# Patient Record
Sex: Female | Born: 1935
Health system: Southern US, Community
[De-identification: ages and names within clinical notes are randomized; demographics above are authoritative.]

## PROBLEM LIST (undated history)

## (undated) DIAGNOSIS — N189 Chronic kidney disease, unspecified: Secondary | ICD-10-CM

## (undated) DIAGNOSIS — Z973 Presence of spectacles and contact lenses: Secondary | ICD-10-CM

## (undated) DIAGNOSIS — I4892 Unspecified atrial flutter: Secondary | ICD-10-CM

## (undated) DIAGNOSIS — I480 Paroxysmal atrial fibrillation: Secondary | ICD-10-CM

## (undated) DIAGNOSIS — Z974 Presence of external hearing-aid: Secondary | ICD-10-CM

## (undated) DIAGNOSIS — Z950 Presence of cardiac pacemaker: Secondary | ICD-10-CM

## (undated) DIAGNOSIS — J810 Acute pulmonary edema: Secondary | ICD-10-CM

## (undated) DIAGNOSIS — I10 Essential (primary) hypertension: Secondary | ICD-10-CM

## (undated) DIAGNOSIS — M8580 Other specified disorders of bone density and structure, unspecified site: Secondary | ICD-10-CM

## (undated) DIAGNOSIS — N179 Acute kidney failure, unspecified: Secondary | ICD-10-CM

## (undated) DIAGNOSIS — N2889 Other specified disorders of kidney and ureter: Secondary | ICD-10-CM

## (undated) DIAGNOSIS — K219 Gastro-esophageal reflux disease without esophagitis: Secondary | ICD-10-CM

## (undated) DIAGNOSIS — D151 Benign neoplasm of heart: Secondary | ICD-10-CM

## (undated) DIAGNOSIS — I495 Sick sinus syndrome: Secondary | ICD-10-CM

## (undated) HISTORY — DX: Gastro-esophageal reflux disease without esophagitis: K21.9

## (undated) HISTORY — PX: APPENDECTOMY: SHX54

## (undated) HISTORY — DX: Other specified disorders of bone density and structure, unspecified site: M85.80

## (undated) HISTORY — PX: CHOLECYSTECTOMY: SHX55

## (undated) HISTORY — DX: Paroxysmal atrial fibrillation: I48.0

## (undated) HISTORY — DX: Unspecified atrial flutter: I48.92

## (undated) HISTORY — DX: Presence of cardiac pacemaker: Z95.0

## (undated) HISTORY — DX: Benign neoplasm of heart: D15.1

## (undated) HISTORY — PX: PARTIAL HYSTERECTOMY: SHX80

## (undated) HISTORY — PX: OTHER SURGICAL HISTORY: SHX169

## (undated) HISTORY — PX: BREAST SURGERY: SHX581

## (undated) HISTORY — DX: Sick sinus syndrome: I49.5

## (undated) HISTORY — DX: Acute pulmonary edema: J81.0

## (undated) HISTORY — DX: Acute kidney failure, unspecified: N17.9

## (undated) HISTORY — PX: PACEMAKER INSERTION: SHX728

---
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
## 2008-06-04 DIAGNOSIS — Z95810 Presence of automatic (implantable) cardiac defibrillator: Secondary | ICD-10-CM | POA: Insufficient documentation

## 2008-06-04 HISTORY — PX: PACEMAKER INSERTION: SHX728

## 2008-06-04 HISTORY — DX: Presence of automatic (implantable) cardiac defibrillator: Z95.810

## 2008-06-04 HISTORY — PX: CHOLECYSTECTOMY: SHX55

## 2008-12-21 ENCOUNTER — Ambulatory Visit: Payer: Self-pay | Admitting: Cardiovascular Disease

## 2008-12-21 ENCOUNTER — Inpatient Hospital Stay (HOSPITAL_COMMUNITY): Admission: AD | Admit: 2008-12-21 | Discharge: 2009-01-04 | Payer: Self-pay | Admitting: Cardiovascular Disease

## 2008-12-22 ENCOUNTER — Encounter: Payer: Self-pay | Admitting: Internal Medicine

## 2008-12-22 ENCOUNTER — Encounter: Payer: Self-pay | Admitting: Cardiovascular Disease

## 2008-12-22 ENCOUNTER — Ambulatory Visit: Payer: Self-pay | Admitting: Thoracic Surgery (Cardiothoracic Vascular Surgery)

## 2008-12-23 ENCOUNTER — Encounter: Payer: Self-pay | Admitting: Thoracic Surgery (Cardiothoracic Vascular Surgery)

## 2008-12-24 ENCOUNTER — Encounter: Payer: Self-pay | Admitting: Thoracic Surgery (Cardiothoracic Vascular Surgery)

## 2008-12-24 HISTORY — PX: ATRIAL MYXOMA EXCISION: SHX1198

## 2009-01-04 ENCOUNTER — Encounter: Payer: Self-pay | Admitting: Internal Medicine

## 2009-01-05 ENCOUNTER — Telehealth: Payer: Self-pay | Admitting: Cardiovascular Disease

## 2009-01-05 ENCOUNTER — Ambulatory Visit: Payer: Self-pay | Admitting: Cardiovascular Disease

## 2009-01-06 ENCOUNTER — Encounter (INDEPENDENT_AMBULATORY_CARE_PROVIDER_SITE_OTHER): Payer: Self-pay | Admitting: *Deleted

## 2009-01-06 ENCOUNTER — Encounter: Payer: Self-pay | Admitting: Cardiology

## 2009-01-06 LAB — CONVERTED CEMR LAB
INR: 2.3
POC INR: 2.3

## 2009-01-08 ENCOUNTER — Inpatient Hospital Stay (HOSPITAL_COMMUNITY)
Admission: EM | Admit: 2009-01-08 | Discharge: 2009-01-19 | Payer: Self-pay | Admitting: Thoracic Surgery (Cardiothoracic Vascular Surgery)

## 2009-01-08 ENCOUNTER — Ambulatory Visit: Payer: Self-pay | Admitting: Internal Medicine

## 2009-01-10 ENCOUNTER — Encounter: Payer: Self-pay | Admitting: Surgery

## 2009-01-11 ENCOUNTER — Encounter: Payer: Self-pay | Admitting: Thoracic Surgery (Cardiothoracic Vascular Surgery)

## 2009-01-17 ENCOUNTER — Encounter: Payer: Self-pay | Admitting: Cardiovascular Disease

## 2009-01-18 ENCOUNTER — Encounter: Payer: Self-pay | Admitting: Cardiovascular Disease

## 2009-01-21 ENCOUNTER — Encounter: Payer: Self-pay | Admitting: Cardiology

## 2009-01-21 ENCOUNTER — Encounter (INDEPENDENT_AMBULATORY_CARE_PROVIDER_SITE_OTHER): Payer: Self-pay | Admitting: Cardiology

## 2009-01-24 ENCOUNTER — Ambulatory Visit: Payer: Self-pay | Admitting: Thoracic Surgery (Cardiothoracic Vascular Surgery)

## 2009-01-24 ENCOUNTER — Encounter
Admission: RE | Admit: 2009-01-24 | Discharge: 2009-01-24 | Payer: Self-pay | Admitting: Thoracic Surgery (Cardiothoracic Vascular Surgery)

## 2009-01-26 ENCOUNTER — Encounter: Payer: Self-pay | Admitting: Internal Medicine

## 2009-01-27 ENCOUNTER — Encounter: Payer: Self-pay | Admitting: Cardiovascular Disease

## 2009-01-27 DIAGNOSIS — I4891 Unspecified atrial fibrillation: Secondary | ICD-10-CM

## 2009-01-27 DIAGNOSIS — M899 Disorder of bone, unspecified: Secondary | ICD-10-CM

## 2009-01-27 DIAGNOSIS — I495 Sick sinus syndrome: Secondary | ICD-10-CM

## 2009-01-27 DIAGNOSIS — D219 Benign neoplasm of connective and other soft tissue, unspecified: Secondary | ICD-10-CM

## 2009-01-27 DIAGNOSIS — K219 Gastro-esophageal reflux disease without esophagitis: Secondary | ICD-10-CM | POA: Insufficient documentation

## 2009-01-27 DIAGNOSIS — M949 Disorder of cartilage, unspecified: Secondary | ICD-10-CM

## 2009-01-27 HISTORY — DX: Disorder of bone, unspecified: M89.9

## 2009-01-27 HISTORY — DX: Unspecified atrial fibrillation: I48.91

## 2009-01-27 HISTORY — DX: Gastro-esophageal reflux disease without esophagitis: K21.9

## 2009-01-27 HISTORY — DX: Sick sinus syndrome: I49.5

## 2009-01-27 HISTORY — DX: Benign neoplasm of connective and other soft tissue, unspecified: D21.9

## 2009-01-27 LAB — CONVERTED CEMR LAB: POC INR: 2.3

## 2009-01-28 ENCOUNTER — Ambulatory Visit: Payer: Self-pay | Admitting: Internal Medicine

## 2009-01-31 LAB — CONVERTED CEMR LAB
Basophils Absolute: 0 10*3/uL (ref 0.0–0.1)
Calcium: 9.2 mg/dL (ref 8.4–10.5)
Creatinine, Ser: 0.8 mg/dL (ref 0.4–1.2)
Eosinophils Absolute: 0.2 10*3/uL (ref 0.0–0.7)
Free T4: 0.7 ng/dL (ref 0.6–1.6)
GFR calc non Af Amer: 74.43 mL/min (ref >60)
HCT: 36.3 % (ref 36.0–46.0)
Hemoglobin: 12 g/dL (ref 12.0–15.0)
Lymphs Abs: 2 10*3/uL (ref 0.7–4.0)
MCHC: 33.1 g/dL (ref 30.0–36.0)
Monocytes Absolute: 1 10*3/uL (ref 0.1–1.0)
Neutro Abs: 7.2 10*3/uL (ref 1.4–7.7)
Platelets: 397 10*3/uL (ref 150.0–400.0)
Pro B Natriuretic peptide (BNP): 111 pg/mL — ABNORMAL HIGH (ref 0.0–100.0)
RDW: 15.9 % — ABNORMAL HIGH (ref 11.5–14.6)
Sodium: 141 meq/L (ref 135–145)
TSH: 3.53 microintl units/mL (ref 0.35–5.50)

## 2009-02-01 ENCOUNTER — Encounter: Payer: Self-pay | Admitting: Cardiovascular Disease

## 2009-02-02 HISTORY — PX: PACEMAKER INSERTION: SHX728

## 2009-02-08 ENCOUNTER — Telehealth: Payer: Self-pay | Admitting: Internal Medicine

## 2009-02-09 ENCOUNTER — Encounter: Payer: Self-pay | Admitting: Cardiology

## 2009-02-10 ENCOUNTER — Ambulatory Visit: Payer: Self-pay | Admitting: Cardiovascular Disease

## 2009-02-16 ENCOUNTER — Encounter: Payer: Self-pay | Admitting: Internal Medicine

## 2009-02-16 ENCOUNTER — Encounter: Payer: Self-pay | Admitting: Cardiovascular Disease

## 2009-02-16 LAB — CONVERTED CEMR LAB: POC INR: 2.7

## 2009-02-21 ENCOUNTER — Encounter: Payer: Self-pay | Admitting: Cardiovascular Disease

## 2009-02-28 ENCOUNTER — Ambulatory Visit: Payer: Self-pay | Admitting: Cardiovascular Disease

## 2009-03-02 ENCOUNTER — Encounter: Payer: Self-pay | Admitting: Cardiovascular Disease

## 2009-03-02 ENCOUNTER — Encounter (INDEPENDENT_AMBULATORY_CARE_PROVIDER_SITE_OTHER): Payer: Self-pay | Admitting: Pharmacist

## 2009-03-07 ENCOUNTER — Encounter (INDEPENDENT_AMBULATORY_CARE_PROVIDER_SITE_OTHER): Payer: Self-pay | Admitting: Cardiology

## 2009-03-08 ENCOUNTER — Encounter: Payer: Self-pay | Admitting: Cardiology

## 2009-03-21 ENCOUNTER — Encounter: Payer: Self-pay | Admitting: Cardiovascular Disease

## 2009-03-21 ENCOUNTER — Ambulatory Visit: Payer: Self-pay | Admitting: Thoracic Surgery (Cardiothoracic Vascular Surgery)

## 2009-04-08 ENCOUNTER — Ambulatory Visit: Payer: Self-pay | Admitting: Internal Medicine

## 2009-04-08 ENCOUNTER — Ambulatory Visit (HOSPITAL_COMMUNITY): Admission: RE | Admit: 2009-04-08 | Discharge: 2009-04-08 | Payer: Self-pay | Admitting: Cardiovascular Disease

## 2009-04-08 ENCOUNTER — Encounter: Payer: Self-pay | Admitting: Cardiovascular Disease

## 2009-04-08 ENCOUNTER — Ambulatory Visit: Payer: Self-pay

## 2009-04-08 ENCOUNTER — Ambulatory Visit: Payer: Self-pay | Admitting: Cardiology

## 2009-04-14 ENCOUNTER — Telehealth: Payer: Self-pay | Admitting: Cardiovascular Disease

## 2009-05-03 ENCOUNTER — Ambulatory Visit: Payer: Self-pay | Admitting: Cardiovascular Disease

## 2009-06-27 ENCOUNTER — Encounter
Admission: RE | Admit: 2009-06-27 | Discharge: 2009-06-27 | Payer: Self-pay | Admitting: Thoracic Surgery (Cardiothoracic Vascular Surgery)

## 2009-06-27 ENCOUNTER — Encounter: Payer: Self-pay | Admitting: Internal Medicine

## 2009-06-27 ENCOUNTER — Ambulatory Visit: Payer: Self-pay | Admitting: Thoracic Surgery (Cardiothoracic Vascular Surgery)

## 2009-08-29 ENCOUNTER — Telehealth: Payer: Self-pay | Admitting: Cardiovascular Disease

## 2009-10-19 ENCOUNTER — Encounter: Payer: Self-pay | Admitting: Internal Medicine

## 2009-11-08 ENCOUNTER — Encounter: Payer: Self-pay | Admitting: Internal Medicine

## 2009-11-14 ENCOUNTER — Ambulatory Visit: Payer: Self-pay | Admitting: Cardiovascular Disease

## 2009-12-02 ENCOUNTER — Encounter: Payer: Self-pay | Admitting: Cardiovascular Disease

## 2009-12-02 ENCOUNTER — Ambulatory Visit: Payer: Self-pay

## 2009-12-02 ENCOUNTER — Ambulatory Visit (HOSPITAL_COMMUNITY): Admission: RE | Admit: 2009-12-02 | Discharge: 2009-12-02 | Payer: Self-pay | Admitting: Cardiovascular Disease

## 2009-12-02 ENCOUNTER — Ambulatory Visit: Payer: Self-pay | Admitting: Internal Medicine

## 2009-12-07 ENCOUNTER — Telehealth: Payer: Self-pay | Admitting: Cardiovascular Disease

## 2010-01-30 ENCOUNTER — Telehealth (INDEPENDENT_AMBULATORY_CARE_PROVIDER_SITE_OTHER): Payer: Self-pay | Admitting: *Deleted

## 2010-02-14 ENCOUNTER — Ambulatory Visit: Payer: Self-pay

## 2010-02-14 ENCOUNTER — Encounter: Payer: Self-pay | Admitting: Internal Medicine

## 2010-04-02 IMAGING — CR DG CHEST 1V PORT
1 series · 1 of 1 positions shown · non-contrast
Comparison: 01/09/2009

CLINICAL DATA: Hypoxia/pleural effusion

PORTABLE CHEST - 1 VIEW

[view not recorded]
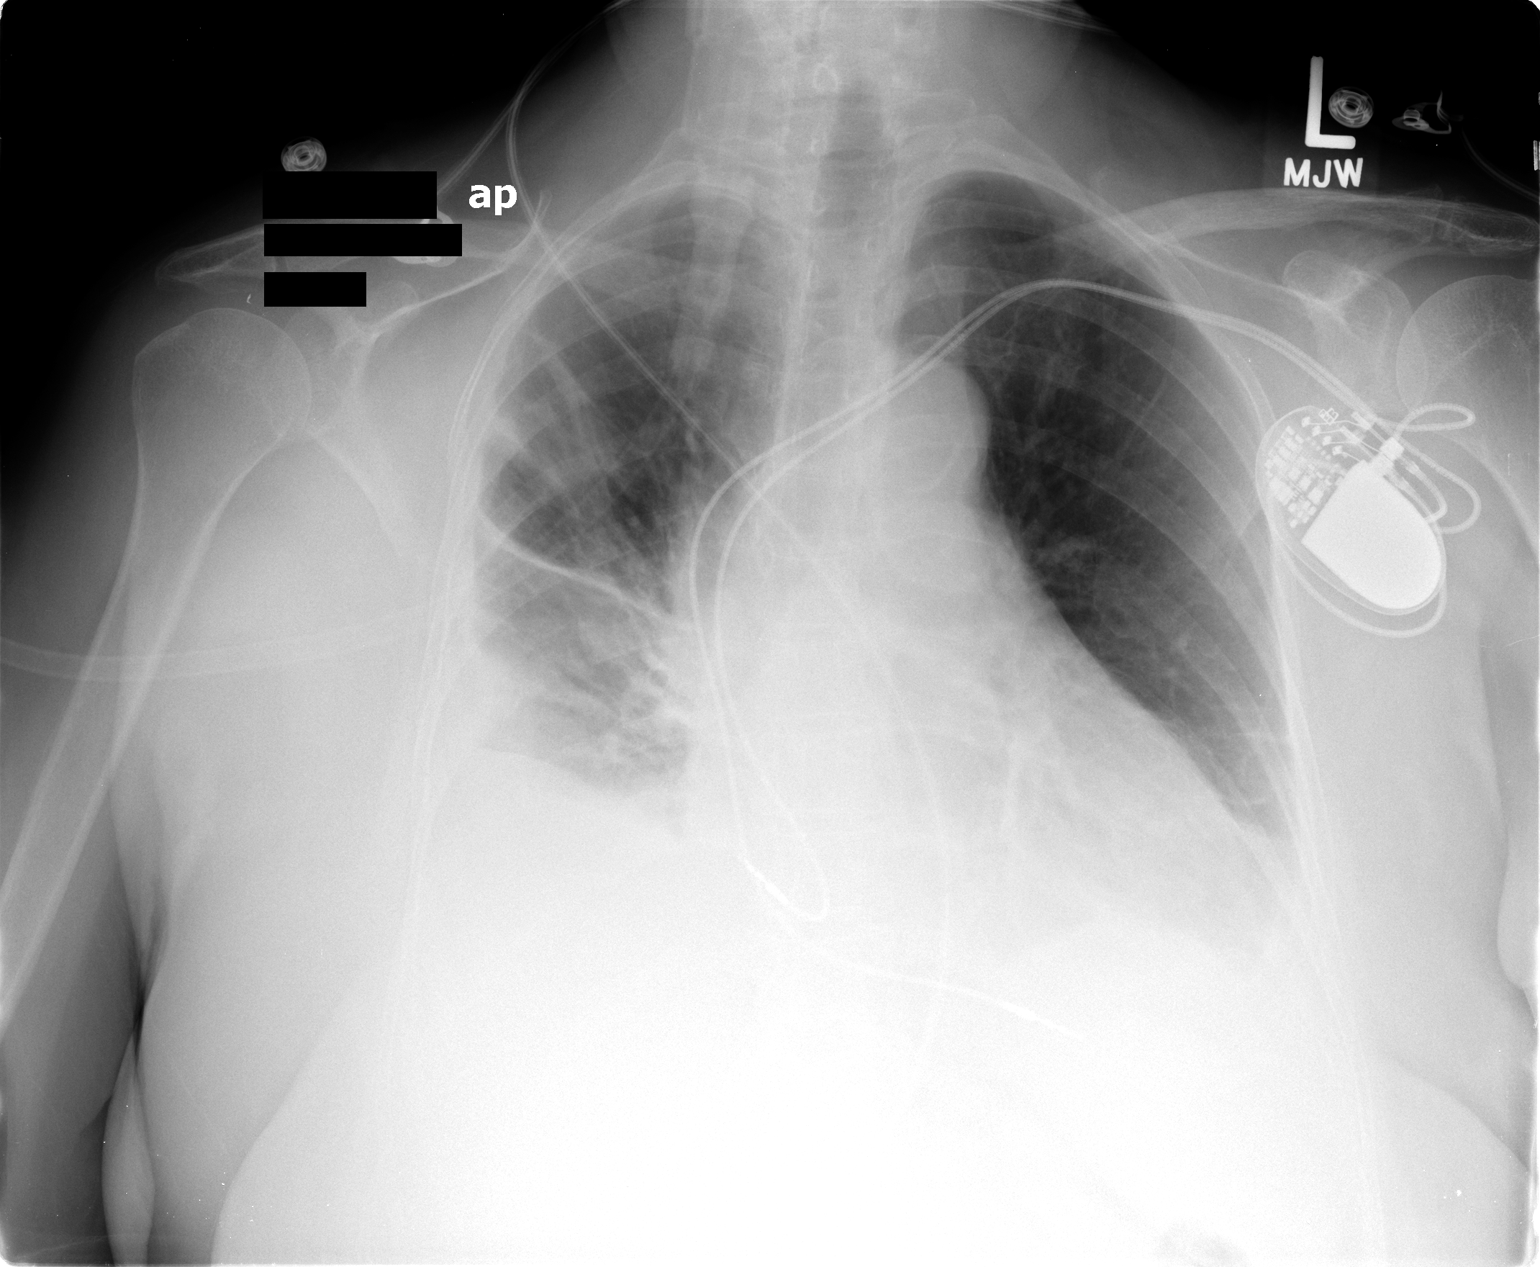

[1 of 1 positions shown; findings below may reference images not displayed]

FINDINGS: Right pleural and parenchymal changes stable.  Left lung
essentially clear.  Mild cardiomegaly without congestive heart
failure.  Overall no significant change.
IMPRESSION: No significant change.

## 2010-04-03 IMAGING — CR DG CHEST 1V PORT
1 series · 1 of 1 positions shown · non-contrast
Comparison: [DATE]/8808 0098 hours

CLINICAL DATA: Postop

PORTABLE CHEST - 1 VIEW

[view not recorded]
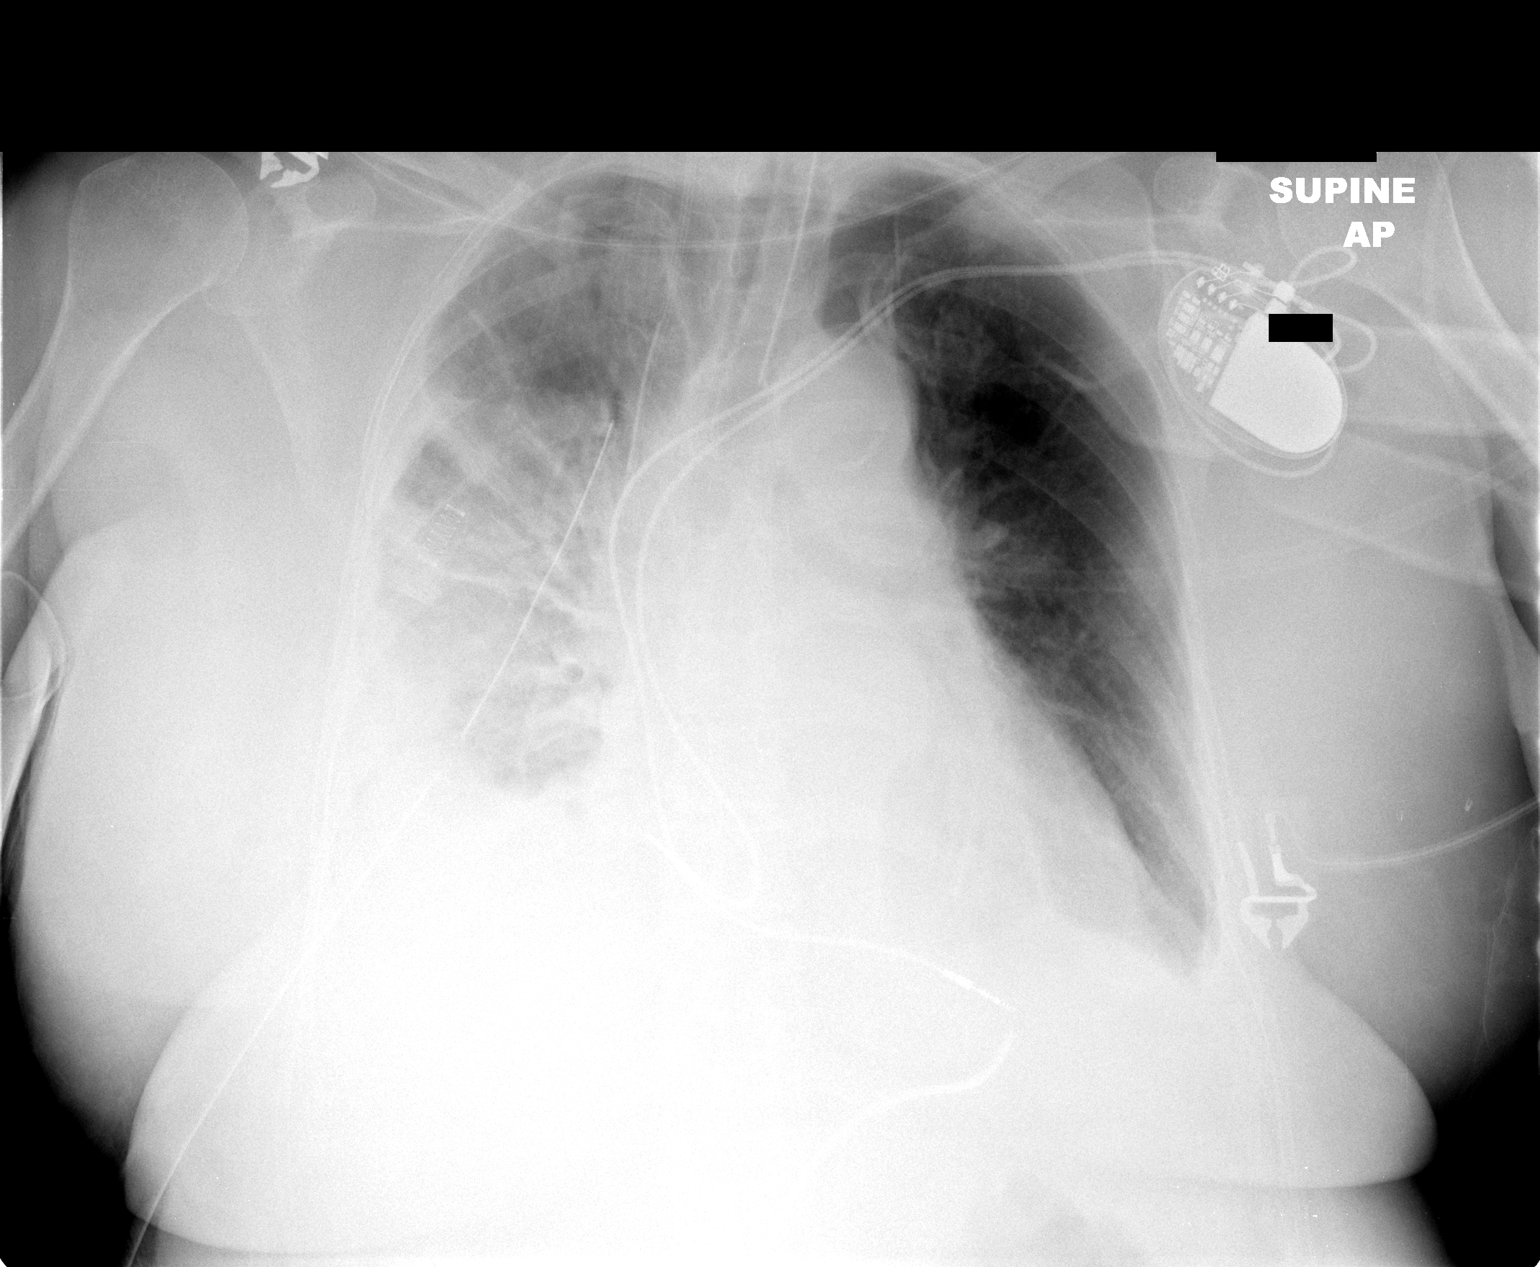

[1 of 1 positions shown; findings below may reference images not displayed]

FINDINGS: An endotracheal tube has been placed with its tip 3 cm
from the carina.  The right chest tube is stable.  Bilateral
pulmonary airspace opacities, right greater than left are stable.
Right pleural fluid is stable.  No pneumothorax.  Small left
effusion is unchanged.  Pacemaker device is stable.  Right internal
jugular central venous catheter is stable.
IMPRESSION: Endotracheal tube placement.  Stable airspace opacities and
effusions right greater than left.  No pneumothorax.

## 2010-04-03 IMAGING — CR DG CHEST 1V PORT
1 series · 1 of 1 positions shown · non-contrast
Comparison: 01/10/2009.

CLINICAL DATA: Pleural effusion.  Hypoxia.  Status post VATS

PORTABLE CHEST - 1 VIEW

[view not recorded]
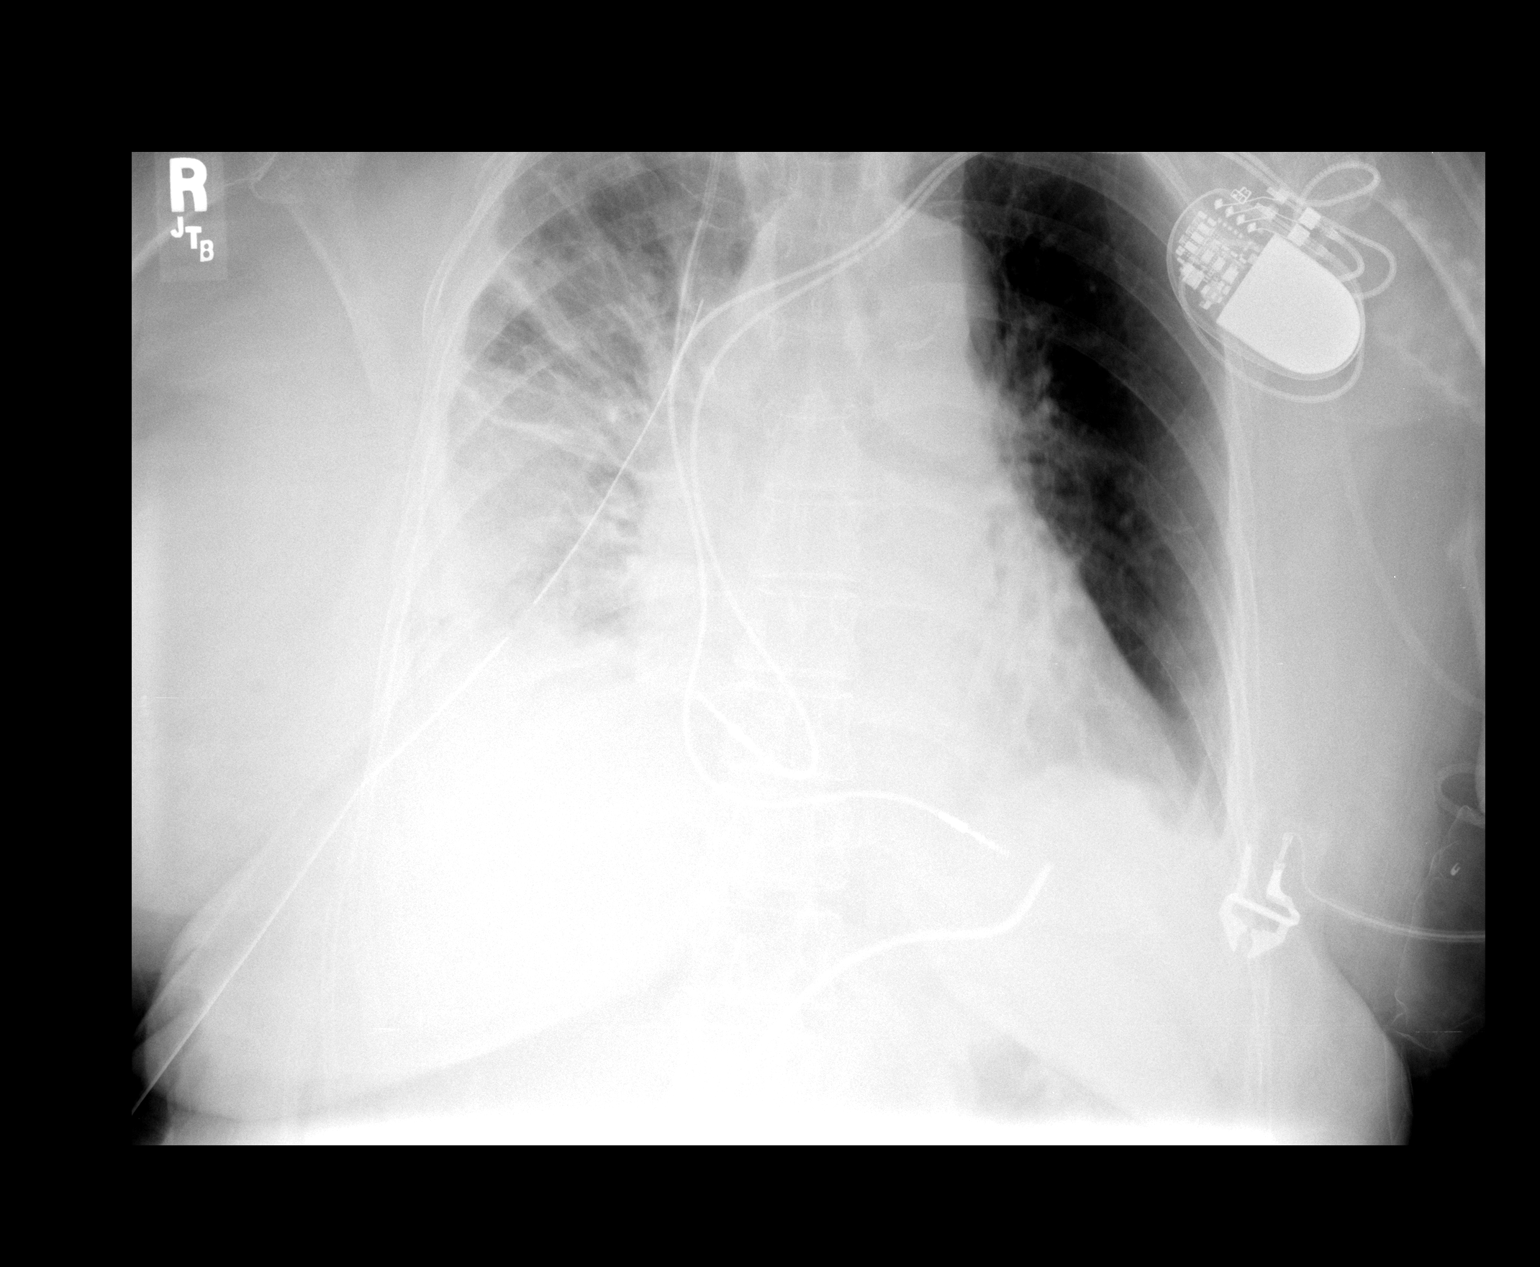

[1 of 1 positions shown; findings below may reference images not displayed]

FINDINGS: There is increasing atelectasis and a airspace disease
within the right hemithorax.  Right thoracostomy tube has been
introduced.  No definite right apical pneumothorax is identified.
Cardiomegaly persists.  Pleural fluid is present in the inferior
right hemithorax.  Left lung appears unchanged.  Pacemaker leads
unchanged.  There is a line of that overlies the central abdomen
and left upper quadrant.  Anatomic location is unclear and it could
potentially be external to the patient. New right IJ central line
is present with the tip terminating in the upper SVC.
IMPRESSION: 1.  Interval placement of right thoracostomy tube without
pneumothorax identified.
2.  Interval placement right IJ central line.
3.  Worsening aeration in the right lung with increasing
atelectasis, airspace disease.

## 2010-04-06 IMAGING — CR DG CHEST 1V PORT
1 series · 1 of 1 positions shown · non-contrast
Comparison: 01/13/2009

CLINICAL DATA: Pleural effusion.  Hypoxia.

PORTABLE CHEST - 1 VIEW

[AP]
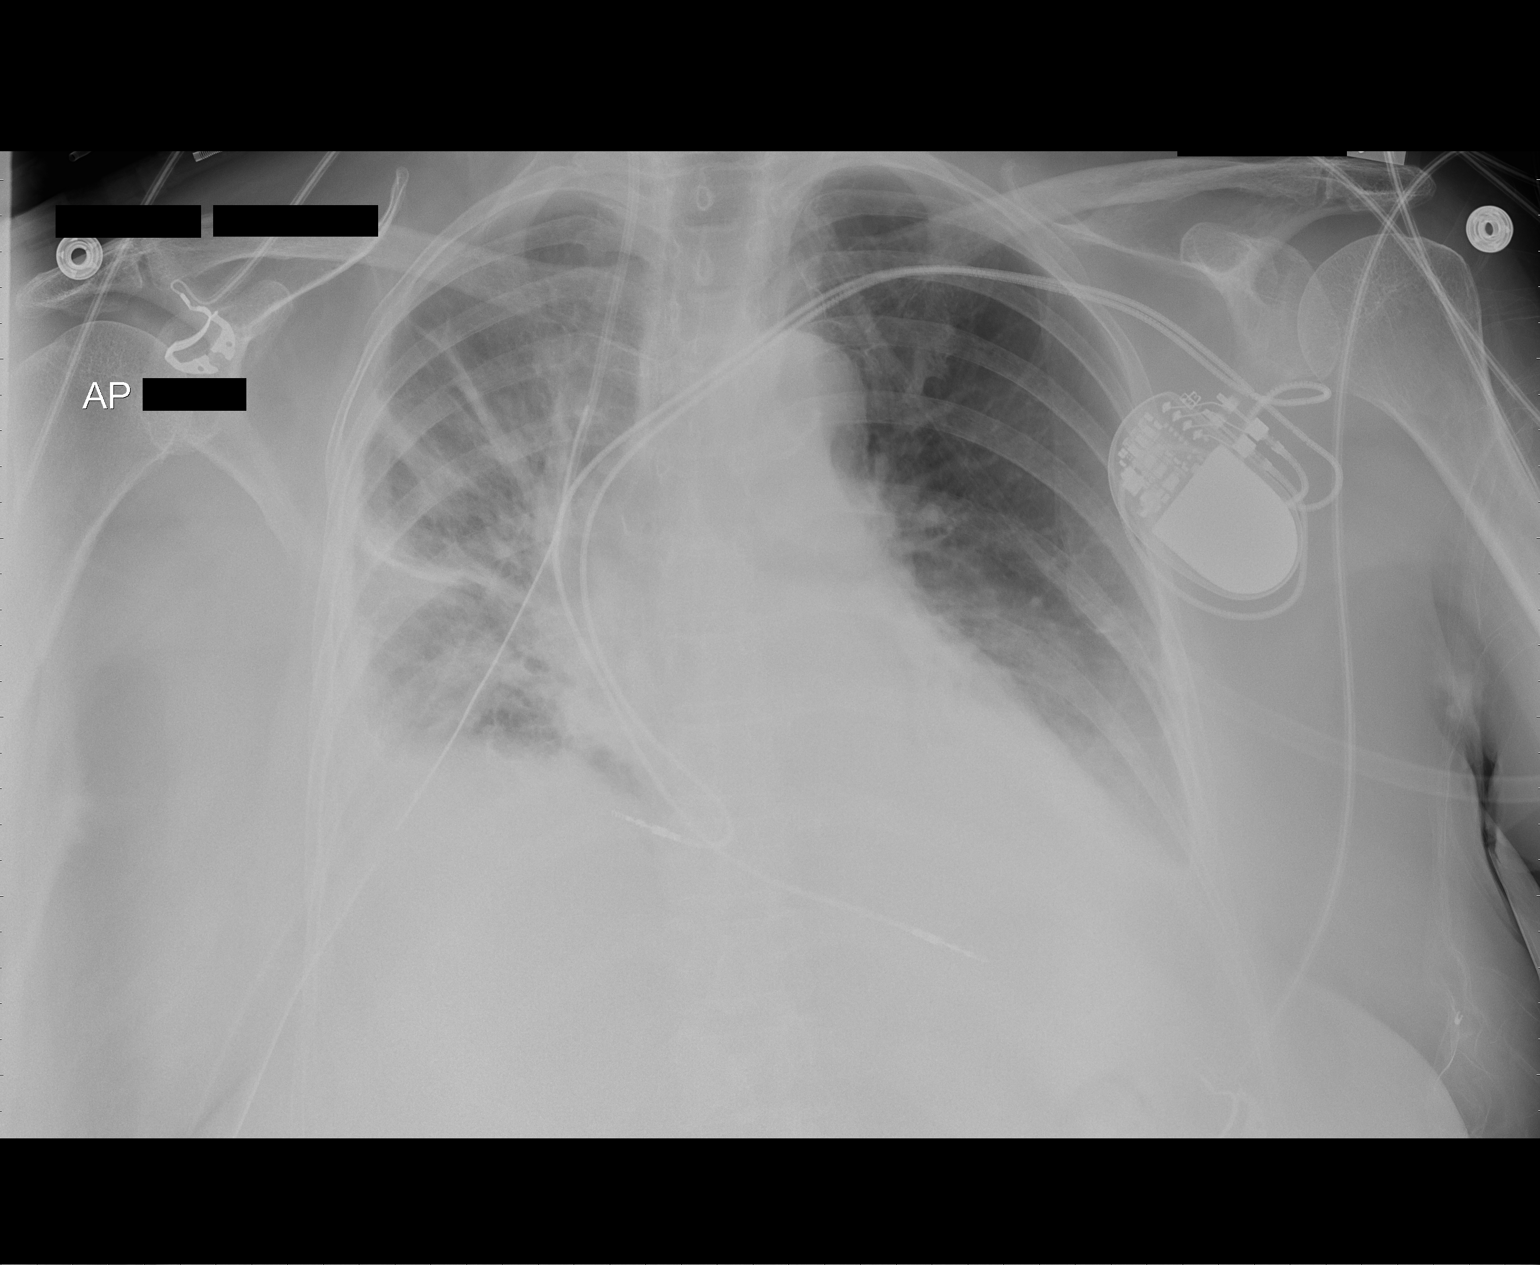

[1 of 1 positions shown; findings below may reference images not displayed]

FINDINGS: Patient has left-sided transvenous pacemaker.  Leads
overlie the right atrium and right ventricle.  The heart is
enlarged.  Left base opacity obscures the hemidiaphragm.  There has
been some improvement in aeration of the right lung base.  Streaky
perihilar and basilar atelectasis persist on the right.  Right
chest tube is in place.  There is no evidence for pneumothorax.
IMPRESSION: Improved aeration right lung base.

## 2010-04-13 ENCOUNTER — Telehealth: Payer: Self-pay | Admitting: Cardiovascular Disease

## 2010-04-16 IMAGING — CR DG CHEST 2V
2 series · 2 of 2 positions shown · non-contrast
Comparison: 01/17/2009.

CLINICAL DATA: Pleural effusion.  Pacemaker.

CHEST - 2 VIEW

[w chest pa]
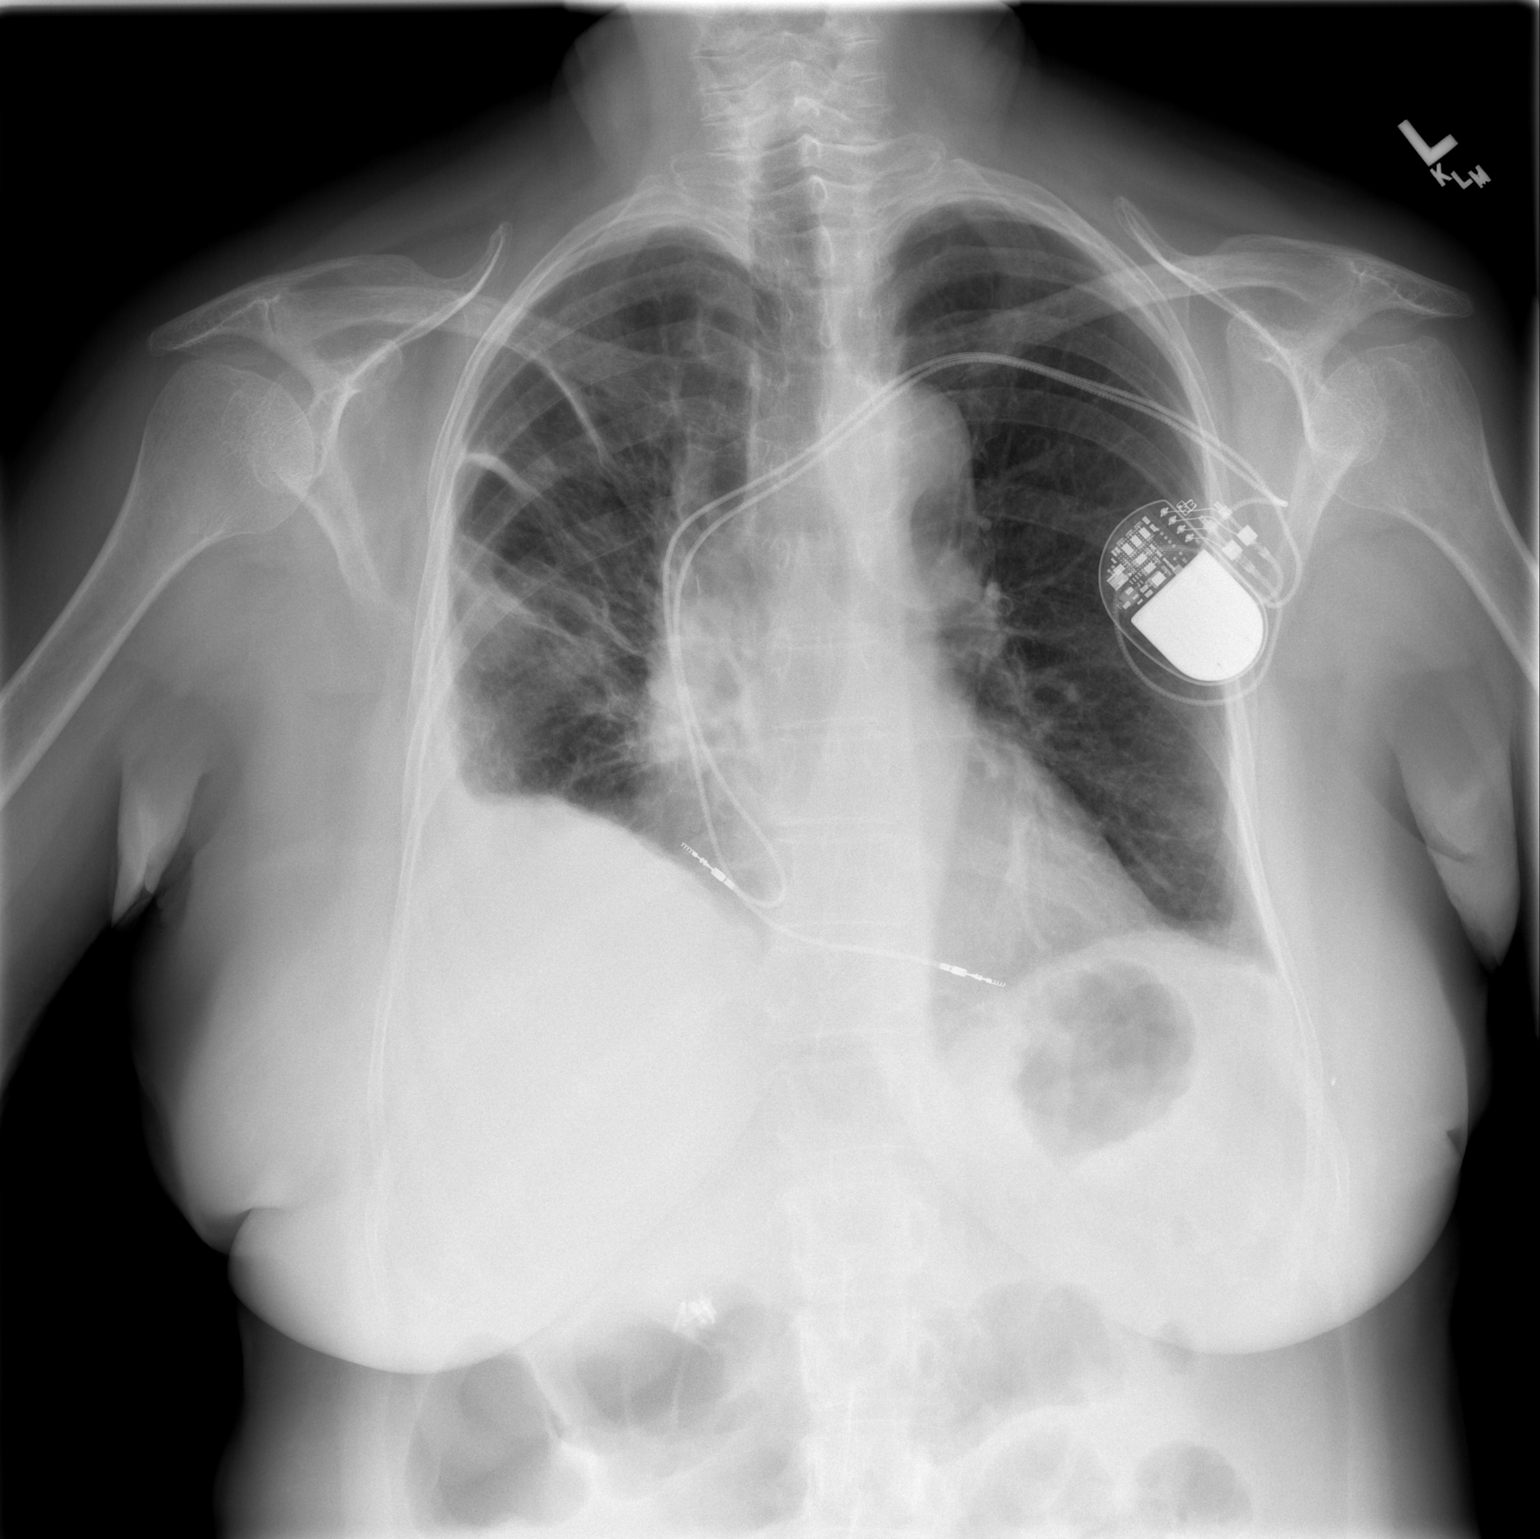

[w chest lat]
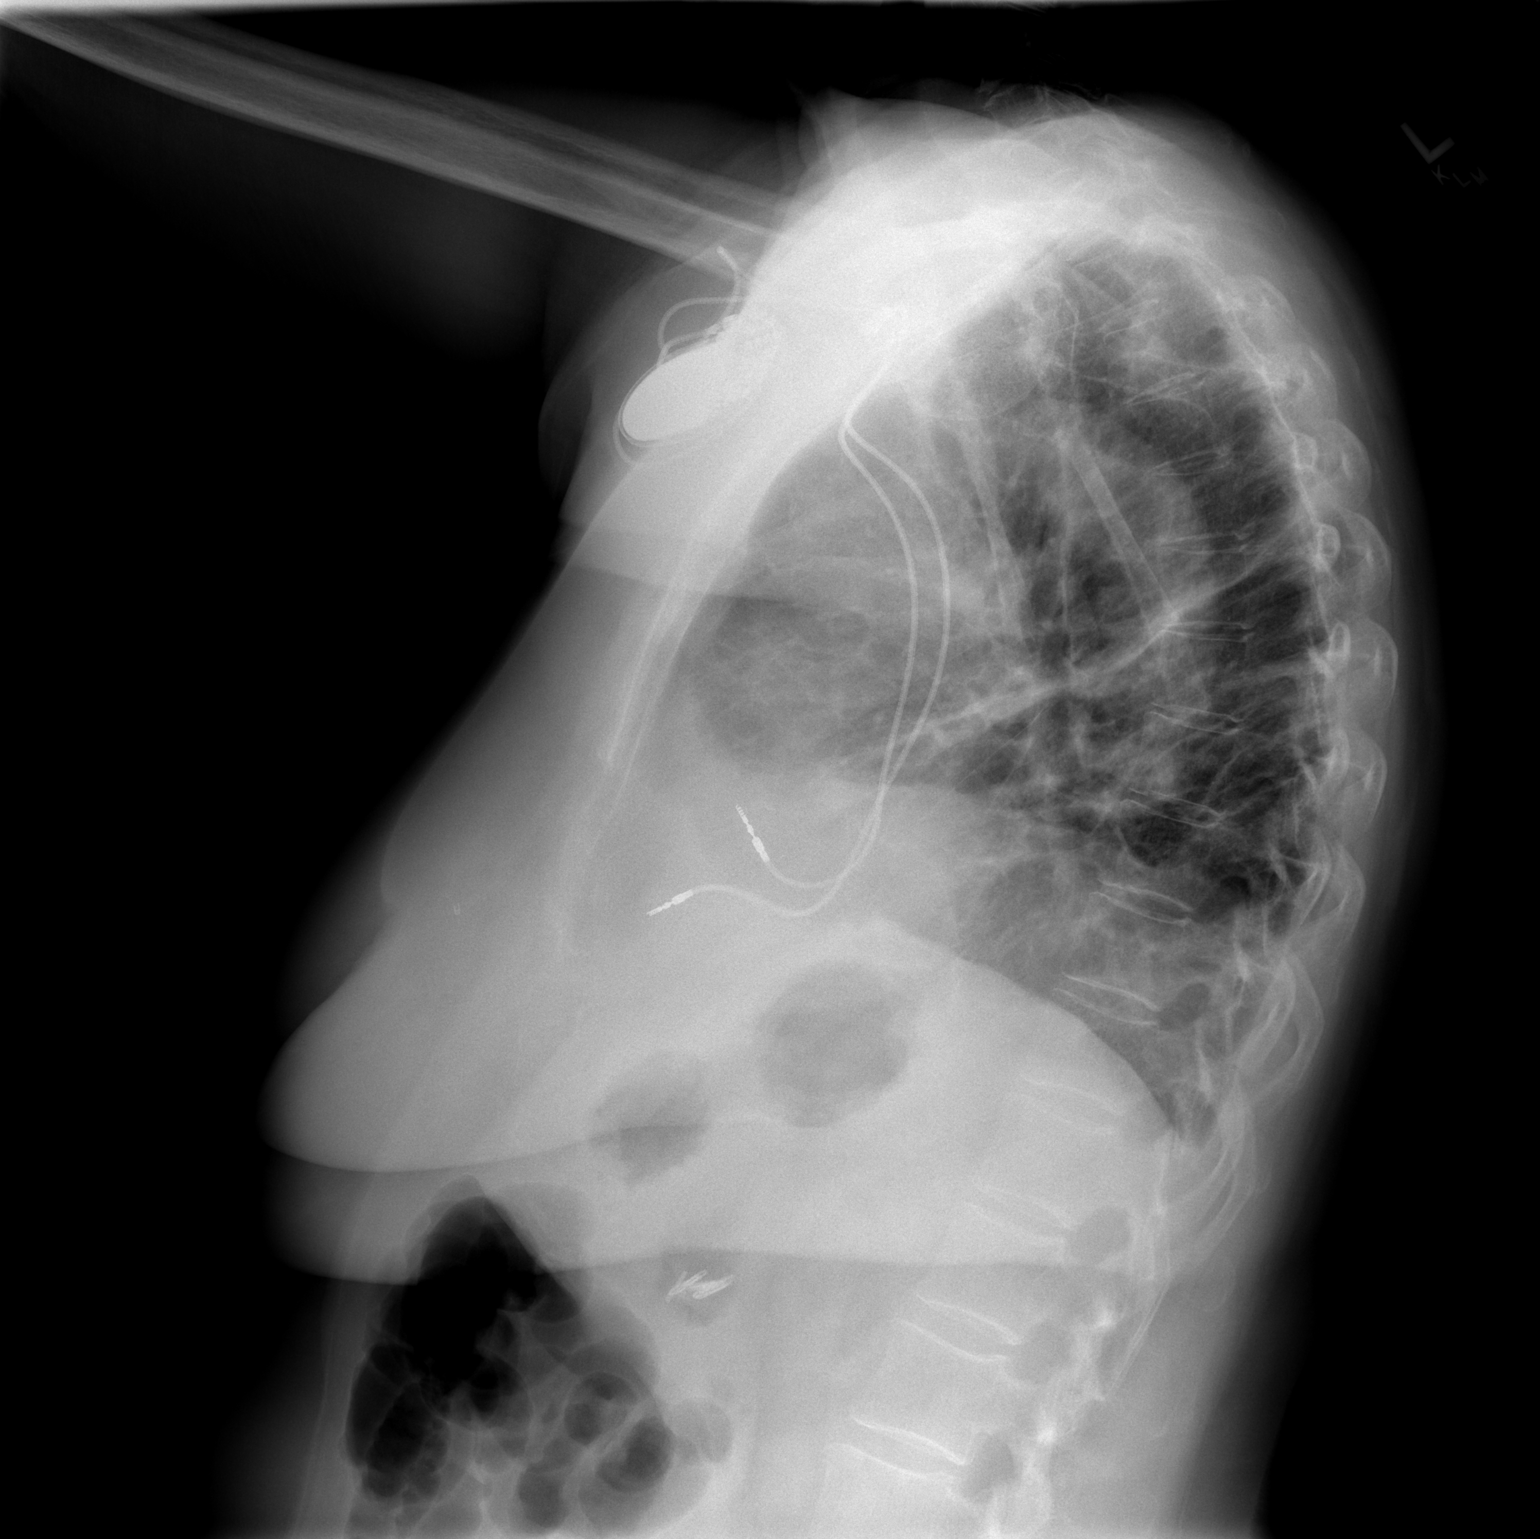

[2 of 2 positions shown; findings below may reference images not displayed]

FINDINGS: Continued further improvement in right lung aeration with
decreasing atelectasis and effusion.  The right IJ central line has
been removed in the interval.  Left lung also shows improved
aeration at the base with only a trace bit of atelectasis now seen
in the left lower lobe.  Heart size is stable.  Left-sided
permanent pacemaker remains in place.
IMPRESSION: Improved aeration in the right lung base with residual tiny right
pleural effusion and subsegmental atelectasis.

Airspace disease at the left lung base on the previous study has
almost completely resolved.

## 2010-05-15 ENCOUNTER — Ambulatory Visit: Payer: Self-pay | Admitting: Cardiovascular Disease

## 2010-06-25 ENCOUNTER — Encounter: Payer: Self-pay | Admitting: Thoracic Surgery (Cardiothoracic Vascular Surgery)

## 2010-06-26 ENCOUNTER — Encounter: Payer: Self-pay | Admitting: Thoracic Surgery (Cardiothoracic Vascular Surgery)

## 2010-07-04 NOTE — Progress Notes (Signed)
  Phone Note Outgoing Call   Call placed by: Dessie Coma,  December 07, 2009 3:06 PM Call placed to: Patient Summary of Call: Patient notified per Dr. Kirke Corin, Echo was fine.

## 2010-07-04 NOTE — Consult Note (Signed)
Summary: [facility] Referral Form   [facility] Referral Form   Imported By: Roderic Ovens 11/14/2009 16:43:41  _____________________________________________________________________  External Attachment:    Type:   Image     Comment:   External Document

## 2010-07-04 NOTE — Procedures (Signed)
Summary: ck pacemaker/mt   Current Medications (verified): 1)  Coumadin 2.5 Mg Tabs (Warfarin Sodium) .... Take As Directed By Coumadin Clinic. 2)  Carvedilol 6.25 Mg Tabs (Carvedilol) .... Take One Tablet By Mouth Twice A Day 3)  Omega 3 340 Mg Cpdr (Omega-3 Fatty Acids) .Marland Kitchen.. 1 By Mouth Once Daily 4)  Calcium Carbonate-Vitamin D 600-400 Mg-Unit  Tabs (Calcium Carbonate-Vitamin D) .... Two Times A Day 5)  Prilosec 20 Mg Cpdr (Omeprazole) .... Once Daily 6)  Amoxicillin 500 Mg Caps (Amoxicillin) .... Take Four Tablets By Mouth 1 Hour Prior To Procedure 7)  Vitamin B Complex-C   Caps (B Complex-C) .Marland Kitchen.. 1 By Mouth Daily 8)  Triamterene-Hctz 75-50 Mg Tabs (Triamterene-Hctz) .... 1/4 Tablet Daily  Allergies (verified): 1)  ! Skelaxin (Metaxalone) 2)  ! Cordarone (Amiodarone Hcl) 3)  ! Statins Depletion (Nutritional Supplements) 4)  ! Alendronate Sodium (Alendronate Sodium) 5)  ! Actonel (Risedronate Sodium) 6)  ! Boniva (Ibandronate Sodium) 7)  ! Jonne Ply  PPM Specifications Following MD:  Hillis Range, MD     PPM Vendor:  Medtronic     PPM Model Number:  ADDRL1     PPM Serial Number:  [id number] [facility] PPM DOI:  12/30/2008     PPM Implanting MD:  Hillis Range, MD  Lead 1    Location: RA     DOI: 12/30/2008     Model #: [id number]     Serial #: [id number]     Status: active Lead 2    Location: RV     DOI: 12/30/2008     Model #: [id number]     Serial #: [id number]     Status: active  Magnet Response Rate:  BOL 85 ERI  65  Indications:  Sick sinus syndrome  Explantation Comments:  no significant afib since July 2010.  V rate histogram is relatively normal.  No significant RVR recently.  Normal Pacemaker function  PPM Follow Up Remote Check?  No Battery Voltage:  2.79 V     Battery Est. Longevity:  12.5 years     Pacer Dependent:  No       PPM Device Measurements Atrium  Impedance: 460 ohms, Threshold: 0.625 V at 0.4 msec Right Ventricle  Amplitude: 8.0 mV, Impedance: 630 ohms, Threshold: 0.75 V at 0.4  msec  Episodes MS Episodes:  7     Percent Mode Switch:  <0.1%     Coumadin:  Yes  Parameters Mode:  MVP (R)     Lower Rate Limit:  70     Upper Rate Limit:  130 Paced AV Delay:  200     Sensed AV Delay:  180 Tech Comments:  Interrogation only,   Ms. Mercier was seen today at the request of her rehab nurse for concerns of "wire out of place".  I explained, with the use of a demo, to Ms. Harkless what she was feeling was where the lead goes into the header of the device.  She seemed much more at ease and with that we can follow up as scheduled. Altha Harm, LPN  February 14, 2010 10:21 AM

## 2010-07-04 NOTE — Assessment & Plan Note (Signed)
Summary: medtronic/sl   Primary Provider:  D.Jaberr.Welton Flakes   History of Present Illness: The patient presents today for routine electrophysiology followup. She reports doing very well since last being seen in our clinic. The patient denies symptoms of palpitations, chest pain, shortness of breath, orthopnea, PND, lower extremity edema, dizziness, presyncope, syncope, or neurologic sequela. The patient is tolerating medications without difficulties and is otherwise without complaint today.   Current Medications (verified): 1)  Coumadin 2.5 Mg Tabs (Warfarin Sodium) .... Take As Directed By Coumadin Clinic. 2)  Carvedilol 6.25 Mg Tabs (Carvedilol) .... Take One Tablet By Mouth Twice A Day 3)  Omega 3 340 Mg Cpdr (Omega-3 Fatty Acids) .Marland Kitchen.. 1 By Mouth Once Daily 4)  Calcium Carbonate-Vitamin D 600-400 Mg-Unit  Tabs (Calcium Carbonate-Vitamin D) .... Two Times A Day 5)  Prilosec 20 Mg Cpdr (Omeprazole) .... Once Daily 6)  Amoxicillin 500 Mg Caps (Amoxicillin) .... Take Four Tablets By Mouth 1 Hour Prior To Procedure 7)  Vitamin B Complex-C   Caps (B Complex-C) .Marland Kitchen.. 1 By Mouth Daily 8)  Triamterene-Hctz 75-50 Mg Tabs (Triamterene-Hctz) .... 1/4 Tablet Daily  Allergies (verified): 1)  ! Skelaxin (Metaxalone) 2)  ! Cordarone (Amiodarone Hcl) 3)  ! Statins Depletion (Nutritional Supplements) 4)  ! Alendronate Sodium (Alendronate Sodium) 5)  ! Actonel (Risedronate Sodium) 6)  ! Boniva (Ibandronate Sodium) 7)  ! Jonne Ply  Past History:  Past Medical History: Reviewed history from 04/08/2009 and no changes required. ATRIAL FIBRILLATION Post OP s/p myxoma resection BRADYCARDIA (ICD-427.89) ATRIAL MYXOMA (ICD-215.9) SICK SINUS/ TACHY-BRADY SYNDROME (ICD-427.81) OSTEOPENIA (ICD-733.90) GERD (ICD-530.81) Pleural effusions/ pericardial effusion post op, sp subxiphoid window  Past Surgical History: Reviewed history from 04/08/2009 and no changes required. Cholecystectomy   Intraoperative  transesophageal echocardiography.  12/24/2008   Guadalupe Maple, M.D.   Left atrial myxoma.  12/24/2008  Salvatore Decent. Cornelius Moras, M.D.   Dual-chamber pacemaker implantation Medtronic :  12/30/2008 Dr. Hillis Range   Intraoperative transesophageal echocardiography.  01/11/2009  Guadalupe Maple, M.D.    Subxiphoid pericardial window.   01/11/2009 Salvatore Decent. Dorris Fetch, M.D Appendectomy  Partial  hysterectomy.   Vital Signs:  Patient profile:   77 year old female Height:      65 inches Weight:      161 pounds BMI:     26.89 Pulse rate:   78 / minute Resp:     16 per minute BP sitting:   131 / 84  (left arm)  Vitals Entered By: Marrion Coy, CNA (December 02, 2009 10:11 AM)  Physical Exam  General:  Well developed, well nourished, in no acute distress. Head:  normocephalic and atraumatic Eyes:  PERRLA/EOM intact; conjunctiva and lids normal. Mouth:  Teeth, gums and palate normal. Oral mucosa normal. Neck:  Neck supple, no JVD. No masses, thyromegaly or abnormal cervical nodes. Chest Wall:  pacemaker site is well healed Lungs:  Clear bilaterally to auscultation and percussion. Heart:  Non-displaced PMI, chest non-tender; regular rate and rhythm, S1, S2 without murmurs, rubs or gallops. Carotid upstroke normal, no bruit. Normal abdominal aortic size, no bruits. Femorals normal pulses, no bruits. Pedals normal pulses. No edema, no varicosities. Abdomen:  Bowel sounds positive; abdomen soft and non-tender without masses, organomegaly, or hernias noted. No hepatosplenomegaly. Msk:  Back normal, normal gait. Muscle strength and tone normal. Pulses:  pulses normal in all 4 extremities Extremities:  No clubbing or cyanosis. Neurologic:  Alert and oriented x 3.  strength/sensation are intact   PPM Specifications Following MD:  Hillis Range, MD     PPM Vendor:  Medtronic     PPM Model Number:  ADDRL1     PPM Serial Number:  [id number] [facility] PPM DOI:  12/30/2008     PPM Implanting MD:  Hillis Range,  MD  Lead 1    Location: RA     DOI: 12/30/2008     Model #: [id number]     Serial #: [id number]     Status: active Lead 2    Location: RV     DOI: 12/30/2008     Model #: 6213     Serial #: [id number]     Status: active  Magnet Response Rate:  BOL 85 ERI  65  Indications:  Sick sinus syndrome  Explantation Comments:  no significant afib since July 2010.  V rate histogram is relatively normal.  No significant RVR recently.  Normal Pacemaker function  PPM Follow Up Remote Check?  No Battery Voltage:  2.79 V     Battery Est. Longevity:  12 YEARS     Pacer Dependent:  No       PPM Device Measurements Atrium  Amplitude: 5.6 mV, Impedance: 480 ohms, Threshold: 0.625 V at 0.4 msec Right Ventricle  Amplitude: 15.68 mV, Impedance: 629 ohms, Threshold: 0.75 V at 0.4 msec  Episodes MS Episodes:  10     Percent Mode Switch:  <0.1%     Coumadin:  Yes Ventricular High Rate:  0     Atrial Pacing:  48.8%     Ventricular Pacing:  <0.1%  Parameters Mode:  MVP (R)     Lower Rate Limit:  70     Upper Rate Limit:  130 Paced AV Delay:  200     Sensed AV Delay:  180 MD Comments:  agree no afib greater than 1 minute since last interrogation normal pacemaker function  Impression & Recommendations:  Problem # 1:  BRADYCARDIA (ICD-427.89) doing very well normal pacemaker function no changes today  Problem # 2:  ATRIAL ARRHYTHMIAS (ICD-427.89) she had post op afib and aflutter following myxoma removal she has had no afib longer than 1 minute since last pacemaker interrogation her chads 2 score is 1 we will consider stopping coumadin if she has had no further afib upon return  Patient Instructions: 1)  Your physician wants you to follow-up in: 6 months Dr Johney Frame.  You will receive a reminder letter in the mail two months in advance. If you don't receive a letter, please call our office to schedule the follow-up appointment. 2)  Your physician recommends that you continue on your current medications as  directed. Please refer to the Current Medication list given to you today.

## 2010-07-04 NOTE — Cardiovascular Report (Signed)
Summary: Office Visit   Office Visit   Imported By: Roderic Ovens 02/20/2010 16:15:43  _____________________________________________________________________  External Attachment:    Type:   Image     Comment:   External Document

## 2010-07-04 NOTE — Procedures (Signed)
Summary: [facility]: Cardiopulmonary Rehab Program  [facility]: Cardiopulmonary Rehab Program   Imported By: Earl Many 12/30/2009 12:03:14  _____________________________________________________________________  External Attachment:    Type:   Image     Comment:   External Document

## 2010-07-04 NOTE — Letter (Signed)
Summary: [facility]   [facility]   Imported By: Roderic Ovens 07/08/2009 10:52:36  _____________________________________________________________________  External Attachment:    Type:   Image     Comment:   External Document

## 2010-07-04 NOTE — Progress Notes (Signed)
  Phone Note Call from Patient   Caller: Patient Summary of Call: T.C. from patient on 08/25/09-c/o body odor since begining cardiac medications.  Wants to know if can try Zinc for this problem.  Per Dr. Freida Busman, may try Zinc but only heart med that might cause body odor is fish oil.  Initial call taken by: Dessie Coma LPN

## 2010-07-04 NOTE — Progress Notes (Signed)
Summary: pt needs to talk about pacer  Phone Note Call from Patient Call back at Home Phone [phone]   Caller: Patient Reason for Call: Talk to Nurse, Talk to Doctor Summary of Call: pt has a pressure pain in her left arm where the socket is and her cardiac rehab nurse told her it seems like it may be a wire or something from her pacer so she wants to talk to someone  Initial call taken by: Omer Jack,  January 30, 2010 4:05 PM  Follow-up for Phone Call        Left message for patient to return our call. Follow-up by: Altha Harm, LPN,  January 31, 2010 1:40 PM  Additional Follow-up for Phone Call Additional follow up Details #1::        To be scheduled for check with device clinic, patient aware. Additional Follow-up by: Altha Harm, LPN,  January 31, 2010 3:47 PM

## 2010-07-04 NOTE — Progress Notes (Signed)
Summary: amoxicillin rx  Phone Note Call from Patient   Caller: Patient Summary of Call: T.C. from patient-requests Rx for Amoxicillin to be called in to [facility]. on [address] for peridontal procedure to be done next week. Initial call taken by: Dessie Coma  LPN,  April 13, 2010 10:48 AM  Follow-up for Phone Call        Phone Call Completed, Rx Called In, Provider Notified Follow-up by: Dessie Coma  LPN,  April 13, 2010 1:51 PM

## 2010-07-20 ENCOUNTER — Encounter: Payer: Self-pay | Admitting: Internal Medicine

## 2010-07-20 ENCOUNTER — Encounter (INDEPENDENT_AMBULATORY_CARE_PROVIDER_SITE_OTHER): Payer: Medicare Other | Admitting: Internal Medicine

## 2010-07-20 DIAGNOSIS — I4891 Unspecified atrial fibrillation: Secondary | ICD-10-CM

## 2010-07-20 DIAGNOSIS — I10 Essential (primary) hypertension: Secondary | ICD-10-CM

## 2010-07-20 DIAGNOSIS — I495 Sick sinus syndrome: Secondary | ICD-10-CM

## 2010-07-20 HISTORY — DX: Essential (primary) hypertension: I10

## 2010-07-26 NOTE — Assessment & Plan Note (Signed)
Summary: 6 MTH ROV/SL/RS PT FROM [facility]UMPL[facility]   Visit Type:  Follow-up Primary Provider:  D.Jaberr.Welton Flakes   History of Present Illness: The patient presents today for routine electrophysiology followup. She reports doing very well since last being seen in our clinic. The patient denies symptoms of palpitations, chest pain, shortness of breath, orthopnea, PND, lower extremity edema, dizziness, presyncope, syncope, or neurologic sequela. The patient is tolerating medications without difficulties and is otherwise without complaint today.   Current Medications (verified): 1)  Triamterene-Hctz 75-50 Mg Tabs (Triamterene-Hctz) .... 1/4 Tablet Daily 2)  Carvedilol 6.25 Mg Tabs (Carvedilol) .... Take One Tablet By Mouth Twice A Day 3)  Calcium Carbonate-Vitamin D 600-400 Mg-Unit  Tabs (Calcium Carbonate-Vitamin D) .... Two Times A Day 4)  Prilosec 20 Mg Cpdr (Omeprazole) .... Once Daily 5)  Amoxicillin 500 Mg Caps (Amoxicillin) .... Take Four Tablets By Mouth 1 Hour Prior To Procedure 6)  Vitamin B Complex-C   Caps (B Complex-C) .Marland Kitchen.. 1 By Mouth Daily 7)  Triamterene-Hctz 75-50 Mg Tabs (Triamterene-Hctz) .... 1/4 Tablet Daily 8)  Appearex 2.5 Mg Tabs (Biotin) .... Uad 9)  Warfarin Sodium 1 Mg Tabs (Warfarin Sodium) .... Use As Directed By Anticoagualtion Clinic 10)  Warfarin Sodium 2.5 Mg Tabs (Warfarin Sodium) .... Use As Directed By Anticoagualtion Clinic  Allergies: 1)  ! Skelaxin (Metaxalone) 2)  ! Cordarone (Amiodarone Hcl) 3)  ! Statins Depletion (Nutritional Supplements) 4)  ! Alendronate Sodium (Alendronate Sodium) 5)  ! Actonel (Risedronate Sodium) 6)  ! Boniva (Ibandronate Sodium) 7)  ! Asa  Past History:  Past Medical History: ATRIAL FIBRILLATION Post OP s/p myxoma resection, without recurrence BRADYCARDIA s/p PPM ATRIAL MYXOMA s/p resection HTN OSTEOPENIA (ICD-733.90) GERD (ICD-530.81) Pleural effusions/ pericardial effusion post op, sp subxiphoid window  Past Surgical  History: Reviewed history from 04/08/2009 and no changes required. Cholecystectomy   Intraoperative transesophageal echocardiography.  12/24/2008   Guadalupe Maple, M.D.   Left atrial myxoma.  12/24/2008  Salvatore Decent. Cornelius Moras, M.D.   Dual-chamber pacemaker implantation Medtronic :  12/30/2008 Dr. Hillis Range   Intraoperative transesophageal echocardiography.  01/11/2009  Guadalupe Maple, M.D.    Subxiphoid pericardial window.   01/11/2009 Salvatore Decent. Dorris Fetch, M.D Appendectomy  Partial  hysterectomy.   Social History:  The patient lives in [place] with her husband.  She is   a retired Runner, broadcasting/film/video with 5 pack-year smoking history, quitting   approximately 20 years ago.  She drinks less than 1 alcoholic beverage  per month.  She consumes less than 1 EtOH beverage per month and denies  illicit drug use.  She has a  regular diet and does no regular exercise.    Review of Systems       All systems are reviewed and negative except as listed in the HPI.   Vital Signs:  Patient profile:   77 year old female Height:      65 inches Weight:      167 pounds BMI:     27.89 Pulse rate:   64 / minute BP sitting:   102 / 80  (left arm)  Vitals Entered By: Laurance Flatten CMA (July 20, 2010 11:28 AM)  Physical Exam  General:  Well developed, well nourished, in no acute distress. Head:  normocephalic and atraumatic Eyes:  PERRLA/EOM intact; conjunctiva and lids normal. Mouth:  Teeth, gums and palate normal. Oral mucosa normal. Neck:  Neck supple, no JVD. No masses, thyromegaly or abnormal cervical nodes. Chest Wall:  pacemaker site is well  healed Lungs:  Clear bilaterally to auscultation and percussion. Heart:  Non-displaced PMI, chest non-tender; regular rate and rhythm, S1, S2 without murmurs, rubs or gallops. Carotid upstroke normal, no bruit. Normal abdominal aortic size, no bruits. Femorals normal pulses, no bruits. Pedals normal pulses. No edema, no varicosities. Abdomen:  Bowel sounds  positive; abdomen soft and non-tender without masses, organomegaly, or hernias noted. No hepatosplenomegaly. Msk:  Back normal, normal gait. Muscle strength and tone normal. Extremities:  No clubbing or cyanosis. Neurologic:  Alert and oriented x 3.   PPM Specifications Following MD:  Hillis Range, MD     PPM Vendor:  Medtronic     PPM Model Number:  ADDRL1     PPM Serial Number:  [id number] [facility] PPM DOI:  12/30/2008     PPM Implanting MD:  Hillis Range, MD  Lead 1    Location: RA     DOI: 12/30/2008     Model #: 1324     Serial #: [id number]     Status: active Lead 2    Location: RV     DOI: 12/30/2008     Model #: 6440     Serial #: [id number]     Status: active  Magnet Response Rate:  BOL 85 ERI  65  Indications:  Sick sinus syndrome  Explantation Comments:  no significant afib since July 2010.  V rate histogram is relatively normal.  No significant RVR recently.  Normal Pacemaker function  PPM Follow Up Pacer Dependent:  No      Episodes Coumadin:  Yes  Parameters Mode:  MVP (R)     Lower Rate Limit:  70     Upper Rate Limit:  130 Paced AV Delay:  200     Sensed AV Delay:  180 MD Comments:  see scanned report  Impression & Recommendations:  Problem # 1:  BRADYCARDIA (ICD-427.89) doing well s/p PPM  normal pacemaker function see scanned report  Problem # 2:  ATRIAL ARRHYTHMIAS (ICD-427.89) she has had no afib documented by pacemaker  though her afib may have been completely post operative in nature, I think that we should continue to follow on coumadin at this time.  She has an allergy to aspirin.  Her CHADS2 socre is 2 (age, HTN).  I would not recommend pradaxa as an alternative to ASA for stroke prevention as this is an ineffective treatment for patients with afib in stroke prevention. We discussed three options today: 1.  observe for 6 more months on coumadin and then stop coumadin if no further afib 2.  continue longterm coumadin 3. stop coumadin and start xarelto (given  prior GERD, I would recommend xarelto over pradaxa).  At this point, she wishes to continue coumadin.  We may stop her coumadin if she has had no further afib upon return.  Problem # 3:  ESSENTIAL HYPERTENSION (ICD-401.9) her blood pressure is low I have encouraged her to take triamterene/HCTZ only as needed for swelling  Patient Instructions: 1)  Your physician wants you to follow-up in: 6 months.   You will receive a reminder letter in the mail two months in advance. If you don't receive a letter, please call our office to schedule the follow-up appointment. 2)  Your physician recommends that you continue on your current medications as directed. Please refer to the Current Medication list given to you today.

## 2010-08-10 NOTE — Cardiovascular Report (Signed)
Summary: Office Visit   Office Visit   Imported By: Roderic Ovens 08/04/2010 12:34:33  _____________________________________________________________________  External Attachment:    Type:   Image     Comment:   External Document

## 2010-09-09 LAB — BLOOD GAS, ARTERIAL
Acid-base deficit: 3.2 mmol/L — ABNORMAL HIGH (ref 0.0–2.0)
Drawn by: 249101
FIO2: 1 %
RATE: 18 resp/min
pCO2 arterial: 40.4 mmHg (ref 35.0–45.0)
pO2, Arterial: 104 mmHg — ABNORMAL HIGH (ref 80.0–100.0)

## 2010-09-09 LAB — BASIC METABOLIC PANEL
BUN: 15 mg/dL (ref 6–23)
BUN: 16 mg/dL (ref 6–23)
BUN: 17 mg/dL (ref 6–23)
BUN: 19 mg/dL (ref 6–23)
CO2: 22 mEq/L (ref 19–32)
CO2: 28 mEq/L (ref 19–32)
CO2: 29 mEq/L (ref 19–32)
CO2: 30 mEq/L (ref 19–32)
CO2: 35 mEq/L — ABNORMAL HIGH (ref 19–32)
Calcium: 7.2 mg/dL — ABNORMAL LOW (ref 8.4–10.5)
Calcium: 7.8 mg/dL — ABNORMAL LOW (ref 8.4–10.5)
Calcium: 8 mg/dL — ABNORMAL LOW (ref 8.4–10.5)
Calcium: 8.4 mg/dL (ref 8.4–10.5)
Chloride: 103 mEq/L (ref 96–112)
Chloride: 105 mEq/L (ref 96–112)
Chloride: 106 mEq/L (ref 96–112)
Chloride: 107 mEq/L (ref 96–112)
Chloride: 96 mEq/L (ref 96–112)
Chloride: 98 mEq/L (ref 96–112)
Creatinine, Ser: 0.65 mg/dL (ref 0.4–1.2)
Creatinine, Ser: 0.67 mg/dL (ref 0.4–1.2)
Creatinine, Ser: 0.8 mg/dL (ref 0.4–1.2)
GFR calc Af Amer: >60 mL/min (ref >60)
GFR calc Af Amer: >60 mL/min (ref >60)
GFR calc Af Amer: >60 mL/min (ref >60)
GFR calc Af Amer: >60 mL/min (ref >60)
GFR calc non Af Amer: >60 mL/min (ref >60)
GFR calc non Af Amer: >60 mL/min (ref >60)
GFR calc non Af Amer: >60 mL/min (ref >60)
Glucose, Bld: 100 mg/dL — ABNORMAL HIGH (ref 70–99)
Glucose, Bld: 162 mg/dL — ABNORMAL HIGH (ref 70–99)
Potassium: 3.2 mEq/L — ABNORMAL LOW (ref 3.5–5.1)
Potassium: 3.9 mEq/L (ref 3.5–5.1)
Potassium: 4 mEq/L (ref 3.5–5.1)
Potassium: 4 mEq/L (ref 3.5–5.1)
Sodium: 135 mEq/L (ref 135–145)
Sodium: 136 mEq/L (ref 135–145)
Sodium: 136 mEq/L (ref 135–145)
Sodium: 137 mEq/L (ref 135–145)

## 2010-09-09 LAB — BODY FLUID CELL COUNT WITH DIFFERENTIAL
Lymphs, Fluid: 77 %
Neutrophil Count, Fluid: 22 % (ref 0–25)

## 2010-09-09 LAB — PROTIME-INR
INR: 1.5 (ref 0.00–1.49)
INR: 1.7 — ABNORMAL HIGH (ref 0.00–1.49)
INR: 1.9 — ABNORMAL HIGH (ref 0.00–1.49)
INR: 2.1 — ABNORMAL HIGH (ref 0.00–1.49)
INR: 3.1 — ABNORMAL HIGH (ref 0.00–1.49)
Prothrombin Time: 17.5 seconds — ABNORMAL HIGH (ref 11.6–15.2)
Prothrombin Time: 19.5 seconds — ABNORMAL HIGH (ref 11.6–15.2)

## 2010-09-09 LAB — BODY FLUID CULTURE: Culture: NO GROWTH

## 2010-09-09 LAB — CBC
HCT: 26.4 % — ABNORMAL LOW (ref 36.0–46.0)
HCT: 26.8 % — ABNORMAL LOW (ref 36.0–46.0)
HCT: 30.2 % — ABNORMAL LOW (ref 36.0–46.0)
Hemoglobin: 10.1 g/dL — ABNORMAL LOW (ref 12.0–15.0)
Hemoglobin: 7.6 g/dL — CL (ref 12.0–15.0)
Hemoglobin: 8.8 g/dL — ABNORMAL LOW (ref 12.0–15.0)
Hemoglobin: 8.8 g/dL — ABNORMAL LOW (ref 12.0–15.0)
Hemoglobin: 9.4 g/dL — ABNORMAL LOW (ref 12.0–15.0)
Hemoglobin: 9.9 g/dL — ABNORMAL LOW (ref 12.0–15.0)
MCHC: 32.6 g/dL (ref 30.0–36.0)
MCHC: 33 g/dL (ref 30.0–36.0)
MCHC: 33 g/dL (ref 30.0–36.0)
MCHC: 33.2 g/dL (ref 30.0–36.0)
MCHC: 33.4 g/dL (ref 30.0–36.0)
MCV: 90.5 fL (ref 78.0–100.0)
MCV: 90.8 fL (ref 78.0–100.0)
MCV: 90.9 fL (ref 78.0–100.0)
MCV: 91.2 fL (ref 78.0–100.0)
MCV: 91.3 fL (ref 78.0–100.0)
Platelets: 299 10*3/uL (ref 150–400)
Platelets: 371 10*3/uL (ref 150–400)
Platelets: 414 10*3/uL — ABNORMAL HIGH (ref 150–400)
Platelets: 583 10*3/uL — ABNORMAL HIGH (ref 150–400)
RBC: 2.6 MIL/uL — ABNORMAL LOW (ref 3.87–5.11)
RBC: 3.29 MIL/uL — ABNORMAL LOW (ref 3.87–5.11)
RBC: 3.3 MIL/uL — ABNORMAL LOW (ref 3.87–5.11)
RBC: 3.44 MIL/uL — ABNORMAL LOW (ref 3.87–5.11)
RDW: 18 % — ABNORMAL HIGH (ref 11.5–15.5)
RDW: 19 % — ABNORMAL HIGH (ref 11.5–15.5)
RDW: 20.1 % — ABNORMAL HIGH (ref 11.5–15.5)
WBC: 10.2 10*3/uL (ref 4.0–10.5)
WBC: 12.3 10*3/uL — ABNORMAL HIGH (ref 4.0–10.5)
WBC: 12.8 10*3/uL — ABNORMAL HIGH (ref 4.0–10.5)
WBC: 6.2 10*3/uL (ref 4.0–10.5)
WBC: 8.4 10*3/uL (ref 4.0–10.5)
WBC: 9.5 10*3/uL (ref 4.0–10.5)

## 2010-09-09 LAB — POCT I-STAT 4, (NA,K, GLUC, HGB,HCT)
Glucose, Bld: 167 mg/dL — ABNORMAL HIGH (ref 70–99)
HCT: 29 % — ABNORMAL LOW (ref 36.0–46.0)
Sodium: 140 mEq/L (ref 135–145)

## 2010-09-09 LAB — BRAIN NATRIURETIC PEPTIDE
Pro B Natriuretic peptide (BNP): 248 pg/mL — ABNORMAL HIGH (ref 0.0–100.0)
Pro B Natriuretic peptide (BNP): 272 pg/mL — ABNORMAL HIGH (ref 0.0–100.0)

## 2010-09-09 LAB — POCT I-STAT 3, ART BLOOD GAS (G3+)
Acid-Base Excess: 2 mmol/L (ref 0.0–2.0)
Acid-Base Excess: 3 mmol/L — ABNORMAL HIGH (ref 0.0–2.0)
Bicarbonate: 29 mEq/L — ABNORMAL HIGH (ref 20.0–24.0)
O2 Saturation: 95 %
O2 Saturation: 95 %
Patient temperature: 98.4
Patient temperature: 98.4
Patient temperature: 99.2
TCO2: 28 mmol/L (ref 0–100)
pCO2 arterial: 37.9 mmHg (ref 35.0–45.0)
pCO2 arterial: 42 mmHg (ref 35.0–45.0)
pH, Arterial: 7.414 — ABNORMAL HIGH (ref 7.350–7.400)
pH, Arterial: 7.634 (ref 7.350–7.400)
pO2, Arterial: 116 mmHg — ABNORMAL HIGH (ref 80.0–100.0)
pO2, Arterial: 70 mmHg — ABNORMAL LOW (ref 80.0–100.0)
pO2, Arterial: 77 mmHg — ABNORMAL LOW (ref 80.0–100.0)

## 2010-09-09 LAB — POCT I-STAT, CHEM 8
Calcium, Ion: 1.11 mmol/L — ABNORMAL LOW (ref 1.12–1.32)
Glucose, Bld: 135 mg/dL — ABNORMAL HIGH (ref 70–99)
HCT: 31 % — ABNORMAL LOW (ref 36.0–46.0)
Hemoglobin: 10.5 g/dL — ABNORMAL LOW (ref 12.0–15.0)
TCO2: 27 mmol/L (ref 0–100)

## 2010-09-09 LAB — APTT
aPTT: 33 seconds (ref 24–37)
aPTT: 49 seconds — ABNORMAL HIGH (ref 24–37)

## 2010-09-09 LAB — TYPE AND SCREEN: ABO/RH(D): A POS

## 2010-09-09 LAB — LACTATE DEHYDROGENASE, PLEURAL OR PERITONEAL FLUID: LD, Fluid: 286 U/L — ABNORMAL HIGH (ref 3–23)

## 2010-09-09 LAB — PROTEIN, BODY FLUID: Total protein, fluid: <3 g/dL

## 2010-09-09 LAB — MAGNESIUM: Magnesium: 2.2 mg/dL (ref 1.5–2.5)

## 2010-09-09 LAB — MRSA PCR SCREENING: MRSA by PCR: NEGATIVE

## 2010-09-10 LAB — BASIC METABOLIC PANEL
BUN: 11 mg/dL (ref 6–23)
BUN: 20 mg/dL (ref 6–23)
BUN: 20 mg/dL (ref 6–23)
BUN: 9 mg/dL (ref 6–23)
CO2: 26 mEq/L (ref 19–32)
CO2: 27 mEq/L (ref 19–32)
CO2: 28 mEq/L (ref 19–32)
CO2: 31 mEq/L (ref 19–32)
CO2: 31 mEq/L (ref 19–32)
CO2: 32 mEq/L (ref 19–32)
CO2: 37 mEq/L — ABNORMAL HIGH (ref 19–32)
Calcium: 8.2 mg/dL — ABNORMAL LOW (ref 8.4–10.5)
Calcium: 8.3 mg/dL — ABNORMAL LOW (ref 8.4–10.5)
Calcium: 8.3 mg/dL — ABNORMAL LOW (ref 8.4–10.5)
Calcium: 9.2 mg/dL (ref 8.4–10.5)
Chloride: 102 mEq/L (ref 96–112)
Chloride: 103 mEq/L (ref 96–112)
Chloride: 105 mEq/L (ref 96–112)
Chloride: 106 mEq/L (ref 96–112)
Chloride: 106 mEq/L (ref 96–112)
Chloride: 112 mEq/L (ref 96–112)
Chloride: 99 mEq/L (ref 96–112)
Creatinine, Ser: 0.75 mg/dL (ref 0.4–1.2)
Creatinine, Ser: 0.76 mg/dL (ref 0.4–1.2)
Creatinine, Ser: 0.77 mg/dL (ref 0.4–1.2)
Creatinine, Ser: 1.02 mg/dL (ref 0.4–1.2)
GFR calc Af Amer: 46 mL/min — ABNORMAL LOW (ref >60)
GFR calc Af Amer: 55 mL/min — ABNORMAL LOW (ref >60)
GFR calc Af Amer: >60 mL/min (ref >60)
GFR calc Af Amer: >60 mL/min (ref >60)
GFR calc Af Amer: >60 mL/min (ref >60)
GFR calc non Af Amer: 53 mL/min — ABNORMAL LOW (ref >60)
GFR calc non Af Amer: 57 mL/min — ABNORMAL LOW (ref >60)
GFR calc non Af Amer: >60 mL/min (ref >60)
GFR calc non Af Amer: >60 mL/min (ref >60)
Glucose, Bld: 102 mg/dL — ABNORMAL HIGH (ref 70–99)
Glucose, Bld: 116 mg/dL — ABNORMAL HIGH (ref 70–99)
Glucose, Bld: 117 mg/dL — ABNORMAL HIGH (ref 70–99)
Glucose, Bld: 128 mg/dL — ABNORMAL HIGH (ref 70–99)
Glucose, Bld: 93 mg/dL (ref 70–99)
Glucose, Bld: 96 mg/dL (ref 70–99)
Potassium: 3.5 mEq/L (ref 3.5–5.1)
Potassium: 3.9 mEq/L (ref 3.5–5.1)
Potassium: 4 mEq/L (ref 3.5–5.1)
Potassium: 4.4 mEq/L (ref 3.5–5.1)
Sodium: 136 mEq/L (ref 135–145)
Sodium: 140 mEq/L (ref 135–145)
Sodium: 141 mEq/L (ref 135–145)
Sodium: 141 mEq/L (ref 135–145)
Sodium: 142 mEq/L (ref 135–145)
Sodium: 142 mEq/L (ref 135–145)
Sodium: 143 mEq/L (ref 135–145)
Sodium: 144 mEq/L (ref 135–145)

## 2010-09-10 LAB — COMPREHENSIVE METABOLIC PANEL
ALT: 24 U/L (ref 0–35)
ALT: 29 U/L (ref 0–35)
AST: 21 U/L (ref 0–37)
AST: 24 U/L (ref 0–37)
Alkaline Phosphatase: 54 U/L (ref 39–117)
Alkaline Phosphatase: 85 U/L (ref 39–117)
CO2: 26 mEq/L (ref 19–32)
CO2: 32 mEq/L (ref 19–32)
Calcium: 8.6 mg/dL (ref 8.4–10.5)
Chloride: 110 mEq/L (ref 96–112)
GFR calc Af Amer: >60 mL/min (ref >60)
GFR calc Af Amer: >60 mL/min (ref >60)
GFR calc non Af Amer: 59 mL/min — ABNORMAL LOW (ref >60)
GFR calc non Af Amer: >60 mL/min (ref >60)
Glucose, Bld: 124 mg/dL — ABNORMAL HIGH (ref 70–99)
Potassium: 3 mEq/L — ABNORMAL LOW (ref 3.5–5.1)
Potassium: 4 mEq/L (ref 3.5–5.1)
Sodium: 138 mEq/L (ref 135–145)
Sodium: 142 mEq/L (ref 135–145)
Total Bilirubin: 0.7 mg/dL (ref 0.3–1.2)

## 2010-09-10 LAB — POCT I-STAT 4, (NA,K, GLUC, HGB,HCT)
Glucose, Bld: 111 mg/dL — ABNORMAL HIGH (ref 70–99)
Glucose, Bld: 115 mg/dL — ABNORMAL HIGH (ref 70–99)
Glucose, Bld: 125 mg/dL — ABNORMAL HIGH (ref 70–99)
Glucose, Bld: 132 mg/dL — ABNORMAL HIGH (ref 70–99)
Glucose, Bld: 132 mg/dL — ABNORMAL HIGH (ref 70–99)
Glucose, Bld: 133 mg/dL — ABNORMAL HIGH (ref 70–99)
Glucose, Bld: 155 mg/dL — ABNORMAL HIGH (ref 70–99)
Glucose, Bld: 96 mg/dL (ref 70–99)
HCT: 21 % — ABNORMAL LOW (ref 36.0–46.0)
HCT: 22 % — ABNORMAL LOW (ref 36.0–46.0)
HCT: 24 % — ABNORMAL LOW (ref 36.0–46.0)
HCT: 25 % — ABNORMAL LOW (ref 36.0–46.0)
HCT: 27 % — ABNORMAL LOW (ref 36.0–46.0)
HCT: 38 % (ref 36.0–46.0)
Hemoglobin: 11.2 g/dL — ABNORMAL LOW (ref 12.0–15.0)
Hemoglobin: 7.1 g/dL — CL (ref 12.0–15.0)
Hemoglobin: 7.1 g/dL — CL (ref 12.0–15.0)
Hemoglobin: 7.5 g/dL — CL (ref 12.0–15.0)
Hemoglobin: 8.2 g/dL — ABNORMAL LOW (ref 12.0–15.0)
Hemoglobin: 8.5 g/dL — ABNORMAL LOW (ref 12.0–15.0)
Hemoglobin: 9.5 g/dL — ABNORMAL LOW (ref 12.0–15.0)
Potassium: 3.1 mEq/L — ABNORMAL LOW (ref 3.5–5.1)
Potassium: 3.9 mEq/L (ref 3.5–5.1)
Potassium: 5.5 mEq/L — ABNORMAL HIGH (ref 3.5–5.1)
Potassium: 5.5 mEq/L — ABNORMAL HIGH (ref 3.5–5.1)
Potassium: 6.1 mEq/L — ABNORMAL HIGH (ref 3.5–5.1)
Potassium: 7 mEq/L (ref 3.5–5.1)
Potassium: 7.2 mEq/L (ref 3.5–5.1)
Sodium: 136 mEq/L (ref 135–145)
Sodium: 137 mEq/L (ref 135–145)
Sodium: 137 mEq/L (ref 135–145)
Sodium: 138 mEq/L (ref 135–145)
Sodium: 141 mEq/L (ref 135–145)
Sodium: 142 mEq/L (ref 135–145)

## 2010-09-10 LAB — POCT I-STAT 3, ART BLOOD GAS (G3+)
Acid-Base Excess: 1 mmol/L (ref 0.0–2.0)
Acid-Base Excess: 6 mmol/L — ABNORMAL HIGH (ref 0.0–2.0)
Acid-base deficit: 2 mmol/L (ref 0.0–2.0)
Bicarbonate: 23.2 mEq/L (ref 20.0–24.0)
Bicarbonate: 24.4 mEq/L — ABNORMAL HIGH (ref 20.0–24.0)
Bicarbonate: 24.8 mEq/L — ABNORMAL HIGH (ref 20.0–24.0)
Bicarbonate: 25.7 mEq/L — ABNORMAL HIGH (ref 20.0–24.0)
O2 Saturation: 100 %
Patient temperature: 32.5
TCO2: 26 mmol/L (ref 0–100)
pCO2 arterial: 35.5 mmHg (ref 35.0–45.0)
pCO2 arterial: 39.9 mmHg (ref 35.0–45.0)
pH, Arterial: 7.377 (ref 7.350–7.400)
pH, Arterial: 7.396 (ref 7.350–7.400)
pH, Arterial: 7.396 (ref 7.350–7.400)
pO2, Arterial: 103 mmHg — ABNORMAL HIGH (ref 80.0–100.0)
pO2, Arterial: 232 mmHg — ABNORMAL HIGH (ref 80.0–100.0)
pO2, Arterial: 327 mmHg — ABNORMAL HIGH (ref 80.0–100.0)
pO2, Arterial: 80 mmHg (ref 80.0–100.0)

## 2010-09-10 LAB — CBC
HCT: 23.1 % — ABNORMAL LOW (ref 36.0–46.0)
HCT: 26.1 % — ABNORMAL LOW (ref 36.0–46.0)
HCT: 30.7 % — ABNORMAL LOW (ref 36.0–46.0)
HCT: 33.5 % — ABNORMAL LOW (ref 36.0–46.0)
Hemoglobin: 10.1 g/dL — ABNORMAL LOW (ref 12.0–15.0)
Hemoglobin: 10.6 g/dL — ABNORMAL LOW (ref 12.0–15.0)
Hemoglobin: 8.6 g/dL — ABNORMAL LOW (ref 12.0–15.0)
Hemoglobin: 8.9 g/dL — ABNORMAL LOW (ref 12.0–15.0)
Hemoglobin: 9.3 g/dL — ABNORMAL LOW (ref 12.0–15.0)
Hemoglobin: 9.4 g/dL — ABNORMAL LOW (ref 12.0–15.0)
Hemoglobin: 9.6 g/dL — ABNORMAL LOW (ref 12.0–15.0)
Hemoglobin: 9.9 g/dL — ABNORMAL LOW (ref 12.0–15.0)
MCHC: 33 g/dL (ref 30.0–36.0)
MCHC: 33 g/dL (ref 30.0–36.0)
MCHC: 33.1 g/dL (ref 30.0–36.0)
MCHC: 33.2 g/dL (ref 30.0–36.0)
MCHC: 33.7 g/dL (ref 30.0–36.0)
MCHC: 34 g/dL (ref 30.0–36.0)
MCHC: 34 g/dL (ref 30.0–36.0)
MCHC: 34.3 g/dL (ref 30.0–36.0)
MCV: 81.1 fL (ref 78.0–100.0)
MCV: 81.2 fL (ref 78.0–100.0)
MCV: 81.7 fL (ref 78.0–100.0)
MCV: 85.5 fL (ref 78.0–100.0)
MCV: 85.7 fL (ref 78.0–100.0)
MCV: 87.3 fL (ref 78.0–100.0)
MCV: 88.3 fL (ref 78.0–100.0)
MCV: 88.6 fL (ref 78.0–100.0)
MCV: 89.2 fL (ref 78.0–100.0)
Platelets: 179 10*3/uL (ref 150–400)
Platelets: 198 10*3/uL (ref 150–400)
Platelets: 258 10*3/uL (ref 150–400)
Platelets: 329 10*3/uL (ref 150–400)
RBC: 2.69 MIL/uL — ABNORMAL LOW (ref 3.87–5.11)
RBC: 2.93 MIL/uL — ABNORMAL LOW (ref 3.87–5.11)
RBC: 3.03 MIL/uL — ABNORMAL LOW (ref 3.87–5.11)
RBC: 3.12 MIL/uL — ABNORMAL LOW (ref 3.87–5.11)
RBC: 3.15 MIL/uL — ABNORMAL LOW (ref 3.87–5.11)
RBC: 3.36 MIL/uL — ABNORMAL LOW (ref 3.87–5.11)
RBC: 3.6 MIL/uL — ABNORMAL LOW (ref 3.87–5.11)
RBC: 3.79 MIL/uL — ABNORMAL LOW (ref 3.87–5.11)
RBC: 4.12 MIL/uL (ref 3.87–5.11)
RDW: 15.9 % — ABNORMAL HIGH (ref 11.5–15.5)
RDW: 17.4 % — ABNORMAL HIGH (ref 11.5–15.5)
RDW: 17.5 % — ABNORMAL HIGH (ref 11.5–15.5)
RDW: 18.3 % — ABNORMAL HIGH (ref 11.5–15.5)
WBC: 16.4 10*3/uL — ABNORMAL HIGH (ref 4.0–10.5)
WBC: 5.6 10*3/uL (ref 4.0–10.5)
WBC: 6.3 10*3/uL (ref 4.0–10.5)
WBC: 8 10*3/uL (ref 4.0–10.5)
WBC: 8.9 10*3/uL (ref 4.0–10.5)
WBC: 9.4 10*3/uL (ref 4.0–10.5)
WBC: 9.7 10*3/uL (ref 4.0–10.5)

## 2010-09-10 LAB — URINALYSIS, ROUTINE W REFLEX MICROSCOPIC
Glucose, UA: NEGATIVE mg/dL
Protein, ur: NEGATIVE mg/dL
pH: 6 (ref 5.0–8.0)

## 2010-09-10 LAB — TYPE AND SCREEN

## 2010-09-10 LAB — PREPARE FRESH FROZEN PLASMA

## 2010-09-10 LAB — URINE MICROSCOPIC-ADD ON

## 2010-09-10 LAB — CARDIAC PANEL(CRET KIN+CKTOT+MB+TROPI)
CK, MB: 2.4 ng/mL (ref 0.3–4.0)
CK, MB: 3.3 ng/mL (ref 0.3–4.0)
CK, MB: 4.1 ng/mL — ABNORMAL HIGH (ref 0.3–4.0)
Relative Index: INVALID (ref 0.0–2.5)
Total CK: 69 U/L (ref 7–177)
Total CK: 77 U/L (ref 7–177)
Troponin I: 0.3 ng/mL — ABNORMAL HIGH (ref 0.00–0.06)
Troponin I: 0.51 ng/mL (ref 0.00–0.06)

## 2010-09-10 LAB — POCT I-STAT 3, VENOUS BLOOD GAS (G3P V)
Bicarbonate: 22.5 mEq/L (ref 20.0–24.0)
Bicarbonate: 25.9 mEq/L — ABNORMAL HIGH (ref 20.0–24.0)
pCO2, Ven: 39.7 mmHg — ABNORMAL LOW (ref 45.0–50.0)
pH, Ven: 7.323 — ABNORMAL HIGH (ref 7.250–7.300)
pO2, Ven: 43 mmHg (ref 30.0–45.0)
pO2, Ven: 43 mmHg (ref 30.0–45.0)

## 2010-09-10 LAB — GLUCOSE, CAPILLARY
Glucose-Capillary: 108 mg/dL — ABNORMAL HIGH (ref 70–99)
Glucose-Capillary: 110 mg/dL — ABNORMAL HIGH (ref 70–99)
Glucose-Capillary: 153 mg/dL — ABNORMAL HIGH (ref 70–99)

## 2010-09-10 LAB — BLOOD GAS, ARTERIAL
Acid-Base Excess: 1.2 mmol/L (ref 0.0–2.0)
pCO2 arterial: 45.1 mmHg — ABNORMAL HIGH (ref 35.0–45.0)
pO2, Arterial: 64.3 mmHg — ABNORMAL LOW (ref 80.0–100.0)

## 2010-09-10 LAB — POCT I-STAT, CHEM 8
BUN: 8 mg/dL (ref 6–23)
Calcium, Ion: 1.04 mmol/L — ABNORMAL LOW (ref 1.12–1.32)
Chloride: 107 mEq/L (ref 96–112)
Creatinine, Ser: 0.8 mg/dL (ref 0.4–1.2)
Glucose, Bld: 178 mg/dL — ABNORMAL HIGH (ref 70–99)
HCT: 24 % — ABNORMAL LOW (ref 36.0–46.0)

## 2010-09-10 LAB — DIFFERENTIAL
Basophils Absolute: 0 10*3/uL (ref 0.0–0.1)
Basophils Relative: 0 % (ref 0–1)
Lymphocytes Relative: 37 % (ref 12–46)
Neutro Abs: 3.6 10*3/uL (ref 1.7–7.7)
Neutrophils Relative %: 55 % (ref 43–77)

## 2010-09-10 LAB — PROTIME-INR
INR: 1.2 (ref 0.00–1.49)
INR: 1.3 (ref 0.00–1.49)
Prothrombin Time: 15.8 seconds — ABNORMAL HIGH (ref 11.6–15.2)

## 2010-09-10 LAB — APTT: aPTT: 41 seconds — ABNORMAL HIGH (ref 24–37)

## 2010-09-10 LAB — LIPID PANEL: Cholesterol: 146 mg/dL (ref 0–200)

## 2010-09-10 LAB — MAGNESIUM: Magnesium: 2.7 mg/dL — ABNORMAL HIGH (ref 1.5–2.5)

## 2010-09-10 LAB — MRSA PCR SCREENING: MRSA by PCR: NEGATIVE

## 2010-09-10 LAB — CREATININE, SERUM: GFR calc Af Amer: >60 mL/min (ref >60)

## 2010-09-10 LAB — HEPARIN LEVEL (UNFRACTIONATED): Heparin Unfractionated: <0.1 IU/mL — ABNORMAL LOW (ref 0.30–0.70)

## 2010-09-16 ENCOUNTER — Encounter: Payer: Self-pay | Admitting: Cardiovascular Disease

## 2010-10-17 NOTE — Consult Note (Signed)
NAME:  Cassandra Romero, Cassandra Romero NO.:  [id number]   MEDICAL RECORD NO.:  [id number]          PATIENT TYPE:  INP   LOCATION:  2011                         FACILITY:  [facility]   PHYSICIAN:  Salvatore Decent. Cornelius Moras, M.D. DATE OF BIRTH:  08-23-1932   DATE OF CONSULTATION:  12/22/2008  DATE OF DISCHARGE:                                 CONSULTATION   REFERRING PHYSICIAN:  Verne Carrow, MD   REASON FOR CONSULTATION:  Left atrial myxoma.   HISTORY OF PRESENT ILLNESS:  [name] is a 77 year old female with no  previous cardiac history, who developed sudden onset of substernal chest  pain radiating to her right shoulder in the early morning hours of December 21, 2008.  This pain persisted for several hours, ultimately prompting  her to present to the emergency room at [facility] in [place].  Baseline electrocardiograms were normal, but cardiac enzymes were  abnormal with elevated troponin levels.  Chest CT scan with IV contrast  was performed to rule out pulmonary embolus.  This revealed a large  filling defect in the heart occupying the left atrium, suggestive of  left atrial myxoma.  There was no sign of pulmonary embolus and no other  cardiopulmonary disease process appreciated.  The patient subsequently  underwent a 2-D echocardiogram, confirming the presence of a mass in the  left atrium.  The patient was transferred to [facility] for further management.  She then underwent transesophageal  echocardiogram confirming the presence of a large mass in the left  atrium, consistent with left atrial myxoma.  No other abnormalities were  noted.  Cardiac catheterization was performed demonstrating normal  coronary artery anatomy with no significant coronary artery disease.  Cardiothoracic surgical consultation was requested.   REVIEW OF SYSTEMS:  GENERAL:  The patient reports normal appetite.  She  has not been gaining nor losing weight recently.  Her energy  level is  good, and she remains physically active.  CARDIAC:  The patient denies  any previous history of chest pain either with activity or at rest.  The  patient denies exertional shortness of breath.  She denies PND,  orthopnea, lower extremity edema.  She has not had any tachy  palpitations or dizzy spells.  RESPIRATORY:  Negative.  The patient  denies productive cough, hemoptysis, wheezing.  GASTROINTESTINAL:  Notable for some history of symptoms of reflux that have been well  controlled on medical therapy.  The patient has no difficulty  swallowing.  She reports normal bowel function.  She denies  hematochezia, hematemesis, melena.  MUSCULOSKELETAL:  Notable for some  problems with pain in the left foot related to chronic injury.  She  still remains active physically, and this does not limit her ambulation  much.  She denies significant problems otherwise with arthritis or  arthralgias.  NEUROLOGIC:  Negative.  The patient denies symptoms  suggestive of previous TIA or stroke.  GENITOURINARY:  Negative.  HEENT:  Negative.   PAST MEDICAL HISTORY:  1. Hyperlipidemia.  2. GE reflux disease.  3. Osteopenia.   PAST SURGICAL HISTORY:  1. Cholecystectomy.  2. Appendectomy.  3. Partial hysterectomy.   FAMILY HISTORY:  Noncontributory.   SOCIAL HISTORY:  The patient is married and lives with her husband in  [place].  She is a retired Runner, broadcasting/film/video.  She has a remote history of  tobacco use although she quit smoking more than 20 years ago.  She  denies significant alcohol consumption.   MEDICATIONS PRIOR TO ADMISSION:  Prilosec, Estrace.   DRUG ALLERGIES:  None known.   PHYSICAL EXAMINATION:  GENERAL:  The patient is a well-appearing,  slightly obese white female, who appears her stated age, in no acute  distress.  VITAL SIGNS:  She is afebrile.  She is normotensive.  HEENT:  Unrevealing.  NECK:  Supple.  There is no palpable lymphadenopathy.  There are no  carotid bruits.   CHEST:  Auscultation of the chest demonstrates presence of clear lung  fields which are symmetrical bilaterally.  No wheezes or rhonchi noted.  CARDIOVASCULAR:  Regular rate and rhythm.  No murmurs, rubs, or gallops  are appreciated.  ABDOMEN:  Soft, nondistended, and nontender.  There are no palpable  masses.  Bowel sounds are present.  EXTREMITIES:  Warm and well perfused.  There is no lower extremity  edema.  Femoral pulses are easily palpable bilaterally.  SKIN:  Clean, dry, healthy appearing throughout.  RECTAL AND GENITOURINARY:  Both deferred.  NEUROLOGIC:  Grossly nonfocal and symmetrical throughout.   DIAGNOSTIC TESTS:  Transesophageal echocardiogram and left and right  heart catheterizations are all reviewed.  Transesophageal echocardiogram  reveals a large smooth bordered mass that occupies the majority of the  left atrium and appears to emanate from the interatrial septum.  Findings are classical for likely left atrial myxoma.  There may be some  mitral regurgitation, although this may all be simply related to the  mass itself.  The aortic valve appears normal.  Left ventricular  function appears normal.  No other significant abnormalities noted.  Coronary artery anatomy is normal, and there is no significant coronary  artery disease.  Left ventricular function appears preserved.  The  infrarenal aorta and iliac vessels all appear normal and free of  disease.   IMPRESSION:  Large mass in the left atrium, consistent with left atrial  myxoma.  The patient is, otherwise, clinically healthy and has normal  coronary artery disease.  There is no sign of distal embolization.  There is no sign of heart failure.   PLAN:  I have discussed the options at length with Cassandra Romero and her  husband.  The need for surgical intervention has been discussed.  Alternative treatment strategies have been reviewed.  Alternative  surgical approaches have also been discussed, and all of their  questions  have been addressed.  We plan to proceed with surgery on Friday, December 24, 2008, for resection of left atrial mass using minimally invasive  approach through right mini thoracotomy.  They understand and accept all  potential associated risks of surgery including but not limited to risk  of death, stroke,  myocardial infarction, congestive heart failure, respiratory failure,  pneumonia, bleeding requiring blood transfusion, arrhythmia, heart block  with bradycardia requiring permanent pacemaker, late recurrence of  cardiac mass or tumor, or possible need to convert to full median  sternotomy.  All of their questions have been addressed.      Salvatore Decent. Cornelius Moras, M.D.  Electronically Signed     CHO/MEDQ  D:  12/23/2008  T:  12/24/2008  Job:  [id number]  cc:   Verne Carrow, MD  Dr Jannet Askew Diona Foley

## 2010-10-17 NOTE — Discharge Summary (Signed)
NAME:  Cassandra Romero, Cassandra Romero NO.:  [id number]   MEDICAL RECORD NO.:  [id number]          PATIENT TYPE:  INP   LOCATION:  2022                         FACILITY:  [facility]   PHYSICIAN:  Salvatore Decent. Cornelius Moras, M.D. DATE OF BIRTH:  [birth year]   DATE OF ADMISSION:  01/08/2009  DATE OF DISCHARGE:  01/19/2009                               DISCHARGE SUMMARY   ADDENDUM   The patient was tentatively ready for discharge home on January 18, 2009.  At this time, the patient remained on oxygen desating with ambulation.  It was felt that she would benefit from one more day to see if able to  be weaned off oxygen.  She was continuing working with cardiac rehab and  attempted to wean off oxygen.  On January 19, 2009, the patient remained  on 2 L at morning.  She got up and ambulated with cardiac rehab and was  able to be weaned off oxygen sating greater than 90% on room air and  maintaining in the 90s post ambulation.  The patient was felt to be  stable and ready for discharge home at this time.  Her last INR level  was 1.8.  The patient's vital signs were stable.  She is afebrile.  She  is in normal sinus rhythm.  Blood pressure is stable.   For details of the patient's followup appointments and discharge  instructions, please see dictated discharge summary.   DISCHARGE MEDICATIONS:  1. Potassium chloride 20 mEq daily.  2. Diltiazem 120 mg b.i.d.  3. Lasix 40 mg daily.  4. Toprol-XL 12.5 mg daily.  5. Ultram 50 mg 1-2 tablets q.4-6 h. p.r.n.  6. Bayer non-aspirin heart health advantage 1 tablet b.i.d.  7. Calcium carbonate/vitamin D 1 tablet b.i.d.  8. Coumadin 2.5 mg daily at night.  9. Omega-3 fatty acids b.i.d.  10.Prilosec 20 mg daily.  11.Cysteine eye drops 1-2 drops both eyes p.r.n.      Sol Blazing, PA      Salvatore Decent. Cornelius Moras, M.D.  Electronically Signed   KMD/MEDQ  D:  01/28/2009  T:  01/29/2009  Job:  [id number]   cc:   Salvatore Decent. Cornelius Moras, M.D.  Theressa Millard, M.D.

## 2010-10-17 NOTE — Op Note (Signed)
NAME:  AMBERLEA, SPAGNUOLO NO.:  [id number]   MEDICAL RECORD NO.:  [id number]           PATIENT TYPE:   LOCATION:                                 FACILITY:   PHYSICIAN:  Salvatore Decent. Dorris Fetch, M.D.DATE OF BIRTH:  January 07, 1933   DATE OF PROCEDURE:  01/11/2009  DATE OF DISCHARGE:                               OPERATIVE REPORT   PREOPERATIVE DIAGNOSES:  1. Right pleural effusion.  2. Pericardial effusion.   POSTOPERATIVE DIAGNOSES:  1. Right pleural effusion.  2. Pericardial effusion.   PROCEDURE:  1. Right video-assisted thoracoscopy.  2. Mini thoracotomy.  3. Drainage of right pleural effusion.  4. Subxiphoid pericardial window.   SURGEON:  Salvatore Decent. Dorris Fetch, MD   ASSISTANT:  Stephanie Acre. Dasovich, PA   ANESTHESIA:  General.   FINDINGS:  Approximately 800 mL of serosanguineous fluid evacuated from  right pleural space, difficult visualization due to body habitus and the  adhesions, small area of hemothorax entered superolaterally, adhesions  of the upper lobe at the anterolateral chest wall, unable to access  pericardium from right chest, subxiphoid pericardial window, 200 mL  bloody effusion drained, marked severe inflammatory changes of the  pericardium and epicardial surfaces, decreased blood pressure after  drainage of pericardial effusion.   CLINICAL NOTE:  Ms. Losier is a 77 year old woman who had undergone  resection of the left atrial myxoma on December 24, 2008, by Dr. Tressie Stalker.  She was readmitted on January 08, 2009, by Dr. Evelene Croon when  she presented with severe shortness of breath.  This had been worsening  over several days and had gotten dramatically worse on the day of  admission.  Chest x-ray revealed a loculated pleural effusion.  A chest  CT showed no evidence of pulmonary emboli, did show both right pleural  and pericardial effusions.  The right pleural effusion was loculated.  An attempt at thoracentesis drained only 200 mL of  serous fluid.  The  patient was advised to undergo right VATS for drainage of pleural  effusion as well as potential pericardial drainage, also potential for  subxiphoid approach for drainage of pericardial fluid.  Indications,  risks, benefits, and alternatives were discussed in detail with the  patient.  She understood and accepted the risk and agreed to proceed.   OPERATIVE NOTE:  Ms. Streeter was brought to the preop holding area on  January 11, 2009.  There, the Anesthesia Service placed an arterial blood  pressure monitoring line and central venous access.  PAS hose were  placed for DVT prophylaxis.  Intravenous antibiotics were administered.  The patient was taken to the operating room, anesthetized and intubated  with a double-lumen endotracheal tube.  Transesophageal echocardiography  was performed that showed a large pericardial effusion without obvious  tamponade.  There was minimal fluid along the right atrium.   The patient was placed in a supine position with the right side  elevated.  The chest was prepped and draped in the usual sterile  fashion.  The prior chest tube site was opened.  The chest was entered  bluntly using  a hemostat.  Suction was placed within the chest and  approximately 600 mL of serosanguineous fluid was evacuated.  A port was  placed through this incision.  Visualization was very limited.  The  scope was essentially within the major fissure.  It was noted there were  some significant adhesions within the chest.  A separate incision was  made more laterally and superiorly.  Some of the adhesions of the lung  and chest wall were taken down just to allow visualization with the  scope.  A separate pocket of fluid was entered, and another 200 mL of  serosanguineous fluid was evacuated.  An attempt was made to visualize  the chest to ensure that all fluid was evacuated, but this was very  difficult to do because of adhesions and body habitus.  There were   significantly profound adhesions of the upper lobe to the anterior chest  wall.  Posteriorly, in this area there was a small hemothorax with some  clotted and liquefied hematoma that was evacuated.  Next, direction was  turned towards the pericardium.  In order to gain better exposure, the  incision was extended medially.  It was clear that the right atrium was  fused to the pericardial at the side of the previous surgery.  An  attempt was made to enter the pericardium along the diaphragmatic  surface; however, because of significant accumulation in this area, it  was not felt to be safe, therefore an incision was made over the xiphoid  process, it was carried through the skin and subcutaneous tissue.  The  rectus muscles were separated in the midline.  The xiphoid process was  freed from surrounding tissue with electrocautery and elevated.  Traction was placed on the diaphragm at its insertion to the  pericardium, and the pericardium was carefully opened.  Initial small  amount of bloody fluid was evacuated.  A small pericardial window was  created.  There was a marked inflammatory hemorrhagic appearance to the  epicardial surface.  There were adhesions within the pericardial space.  These were taken down gently using blunt finger dissection.  Approximately, 200 mL of fluid was evacuated.  Transesophageal  echocardiography revealed near-complete resolution of the pericardial  effusion.  A 36-French chest tube was placed laterally to drain the  pleural space, and a 28-French Blake drain was placed in the pericardium  the drain the pericardial space.  The incisions were closed with a #1  Vicryl fascial suture followed by 2-0 Vicryl subcutaneous sutures and 3-  0 Vicryl subcuticular sutures.  All sponge, needle, and instrument  counts were correct at the end of the procedure.  The patient was  extubated in the operating room and taken to the recovery room in stable  condition.       Salvatore Decent Dorris Fetch, M.D.  Electronically Signed     SCH/MEDQ  D:  01/11/2009  T:  01/12/2009  Job:  [id number]   cc:   Salvatore Decent. Cornelius Moras, M.D.  Verne Carrow, MD  Lise Auer, MD

## 2010-10-17 NOTE — Op Note (Signed)
Cassandra Romero, Cassandra Romero                ACCOUNT NO.:  [id number]   MEDICAL RECORD NO.:  [id number]          PATIENT TYPE:  INP   LOCATION:  2304                         FACILITY:  [facility]   PHYSICIAN:  Guadalupe Maple, M.D.  DATE OF BIRTH:  [birth year]   DATE OF PROCEDURE:  12/24/2008  DATE OF DISCHARGE:                               OPERATIVE REPORT   PROCEDURE:  Intraoperative transesophageal echocardiography.   [name] is a 77 year old white female with a history of left  atrial myxoma, who is scheduled to undergo resection of this tumor.  Intraoperative transesophageal echocardiography was requested to assist  with localization of the tumor and to assist with the resection.  The  transesophageal echocardiography was also requested to evaluate for any  valvular pathology and as a monitor for intraoperative volume status.   The patient was brought to the operating room at [facility] and  general endotracheal anesthesia was induced without difficulty.  Following orogastric suctioning, the transesophageal echocardiography  probe was inserted into the esophagus without difficulty.   IMPRESSION:  Prebypass findings:  1. Aortic valve.  The aortic valve was trileaflet and the leaflets      were thin and pliable.  The leaflets open normally and there was no      aortic insufficiency.  2. Mitral valve.  The mitral leaflets appeared thin and appeared to      open normally.  There was 1+ to 2+ mitral insufficiency.  However,      the left atrial myxoma appeared to interfere to some degree with      mitral filling.           ______________________________  Guadalupe Maple, M.D.     DCJ/MEDQ  D:  12/24/2008  T:  12/25/2008  Job:  [id number]

## 2010-10-17 NOTE — Consult Note (Signed)
NAME:  Cassandra Romero, Cassandra Romero NO.:  [id number]   MEDICAL RECORD NO.:  [id number]          PATIENT TYPE:  INP   LOCATION:  2015                         FACILITY:  [facility]   PHYSICIAN:  Hillis Range, MD       DATE OF BIRTH:  03/13/1933   DATE OF CONSULTATION:  DATE OF DISCHARGE:                                 CONSULTATION   REQUESTING PHYSICIAN:  Salvatore Decent. Cornelius Moras, MD   REASON FOR CONSULTATION:  Tachycardia bradycardia following myxoma  resection.   HISTORY OF PRESENT ILLNESS:  Ms. Hodgkiss is a pleasant 77 year old female  who is currently recovering postoperative day #5 status post resection  of a large left atrial myxoma.  She initially presented to [facility] on  December 21, 2008, with complaints of chest pain.  A CT scan of the chest at  that time revealed a large left atrial mass.  An echocardiogram was  performed, which revealed a preserved ejection fraction, however, she  was confirmed to have a large left atrial mass consistent with a myxoma  emanating from a stock.  This myxoma was noted to have hemodynamic  significance and severe TR was observed.  The patient therefore  underwent resection of the myxoma by Dr. Cornelius Moras.  This resection required  and interatrial patch as the myxoma appeared to arise from a stock along  the interatrial septum and posterior left atrial wall along the superior  rim of the fossa ovalis.  The patient has subsequently had very  significant bradycardias requiring temporary backup pacing  postoperatively.  She has a very slow junctional escape rhythm.  The  patient also has frequent episodes of atrial fibrillation and atrial  flutter for which she has been intolerant of amiodarone therapy.  She  has required chronotropic agents for rate control and this has continued  to exacerbate her underlying bradycardia.  She continues to require  temporary pacing 5 days postoperatively.  Today, the patient developed  significant bradycardia with  hypotension and symptoms of presyncope.  She reports significant fatigue and dizziness when in junctional rhythm.  She also reports significant palpitations during her tachy atrial  arrhythmias.  Presently, she is resting comfortably.  She reports that  she is very weak, but otherwise without complaint.  She denies chest  pain, shortness of breath, orthopnea, or other concerns.   PAST MEDICAL HISTORY:  1. Atrial myxoma (as above).  2. GERD.  3. Osteopenia.  4. Status post cholecystectomy.   ALLERGIES:  She is intolerant to aspirin as well as statins.  She has  not tolerated amiodarone during this hospital stay.   CURRENT MEDICATIONS:  1. Aspirin 325 mg daily.  2. Lasix 40 mg b.i.d.  3. Metoprolol 25 mg b.i.d.  4. Protonix 40 mg daily.  5. Potassium chloride 20 mEq b.i.d.  6. Coumadin.  7. Diltiazem drip.   SOCIAL HISTORY:  The patient lives in [place] with her husband.  She is  a retired Runner, broadcasting/film/video.  She has a 5-pack-a-year history of tobacco, but quit  20 years ago.  She rarely drinks alcohol.   FAMILY HISTORY:  Notable for mother who died of pancreatic cancer.  The  patient's father had coronary artery disease in his 33s and died at age  45 from CAD.   REVIEW OF SYSTEMS:  All systems are reviewed and negative except as  outlined in the HPI above.   TELEMETRY:  I reviewed the telemetry of this patient, which reveals AV  sequential pacing presently.  She has also had frequent episodes of  atrial fibrillation and atrial flutter with rapid ventricular rates.   PHYSICAL EXAMINATION:  VITALS:  Blood pressure 92/56, heart rate 70,  respirations 18, sats 93% on 2 L, afebrile.  GENERAL:  The patient is a thin, ill-appearing female in no acute  distress.  She is alert and oriented x3.  HEENT:  Normocephalic and atraumatic.  Sclerae clear.  Conjunctivae  pale.  Oropharynx clear.  NECK:  Supple.  No thyromegaly, JVD, or bruits.  LUNGS:  Decreased breath sounds throughout, otherwise  clear.  HEART:  Regular rate and rhythm.  No murmurs, rubs, or gallops.  GI:  Soft, nontender, and nondistended.  Positive bowel sounds.  EXTREMITIES:  No clubbing, cyanosis, or edema.  NEUROLOGIC:  Strength and sensation are intact.  SKIN:  The patient has diffuse ecchymoses.  MUSCULOSKELETAL:  No deformity or atrophy.  PSYCHIATRY:  Euthymic mood.  Full affect.   EKG from December 25, 2008, reveals a junctional rhythm of 44 beats per  minute with a QRS duration of 84 milliseconds.  She also has documented  atrial pacing on subsequent EKGs.   Chest x-ray:  I reviewed the patient's chest x-ray from December 28, 2008,  which reveals no obvious pneumothorax.  There is prominent interstitial  opacity within the right lung, the left lung is clear.   LABORATORY DATA:  Potassium 3.0, BUN 20, creatinine 0.9.  INR 1.1.  Hematocrit 27, platelets 272, white blood cell count 8.   Urinalysis from December 23, 2008, is unremarkable.   I have reviewed the patient's transthoracic echocardiogram from December 22, 2008, which documents a preserved ejection fraction and the previously  described left atrial mass.   IMPRESSION:  Ms. Isabell is a very pleasant 77 year old female who was  admitted with chest pain and discovered to have a large left atrial  myxoma.  She has had resection of her myxoma and now has iatrogenic  postoperative bradycardia.  She has a junctional rhythm, but remains  very symptomatic with this.  She has had episodes of hypotension.  She  also has multiple atrial arrhythmias, which are likely secondary to the  healing process within her left atrium.  She has been documented to have  both atrial flutter and atrial fibrillation with very rapid ventricular  rates.  These rapid ventricular rates have required intravenous  diltiazem and metoprolol.  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  I have had a long discussion about the patient  with Dr. Cornelius Moras.  He feels that given  the extensive surgical procedure  required that it is unlikely that her sinus node will recover.  As she  remains very symptomatic on postoperative day #5 and continues to  require chronotropic agents for her atrial arrhythmias, I think that we  should proceed with pacemaker implantation.  We will also continue rate  control for her atrial arrhythmias in long term.   PLAN:  Therapeutic strategies for tachycardia bradycardia syndrome,  symptomatic bradycardia, and atrial arrhythmias were discussed in detail  with the patient and her spouse.  Risks,  benefits, and alternatives to  pacemaker implantation were discussed in detail with the patient and her  spouse.  She recognizes that the risks include but are not limited to  infection, bleeding, pneumothorax, perforation, tamponade, lead  dislodgement, renal failure, death, MI, and stroke.  She wishes to  proceed.  We will therefore plan for pacemaker implantation at the next  available time.      Hillis Range, MD  Electronically Signed     JA/MEDQ  D:  12/29/2008  T:  12/30/2008  Job:  [id number]   cc:   Salvatore Decent. Cornelius Moras, M.D.

## 2010-10-17 NOTE — Op Note (Signed)
NAMEALIN, HUTCHINS                ACCOUNT NO.:  [id number]   MEDICAL RECORD NO.:  [id number]          PATIENT TYPE:  INP   LOCATION:  2304                         FACILITY:  [facility]   PHYSICIAN:  Guadalupe Maple, M.D.  DATE OF BIRTH:  May 04, 1933   DATE OF PROCEDURE:  12/24/2008  DATE OF DISCHARGE:                               OPERATIVE REPORT   PROCEDURE:  Intraoperative transesophageal echocardiography.   [name] is a 77 year old white female with a history of an  enlarged left atrial myxoma, which was causing symptoms of chest pain.  She is now scheduled to undergo resection of the myxoma by Dr. Tressie Stalker.  Intraoperative transesophageal echocardiography was requested to  evaluate the left atrial myxoma, to determine its location and site of  attachment, and also the transesophageal echocardiography was requested  to assist with the resection and to assess for any valvular pathology  and serve as a monitor for intraoperative volume status.   The patient was brought to the operating room at [facility] and  general anesthesia was induced without difficulty.  Following orogastric  suctioning, the transesophageal echocardiography probe was inserted into  the esophagus without difficulty.   IMPRESSION:  Prebypass Findings:  1. Left atrium.  There was a large left atrial mass noted, which      appeared to be attached in the area of the interatrial septum just      superior to the fossa ovalis.  In that, the mass measured 4.55 x      3.35 cm in diameter.  It appeared to oscillate with a cardiac cycle      and there was flow acceleration around the mass suggesting      obstructive physiology.  There was no thrombus noted in the left      atrium or left atrial appendage.  The mass appeared to interfere      somewhat with mitral filling as well.  2. Aortic valve.  The aortic valve was trileaflet and was normal      appearing.  The leaflets opened well, and there was  no aortic      insufficiency and there was no calcification of the leaflet.  3. Mitral valve.  The mitral leaflets were thin and pliable.  As      noted, there was some interference with mitral filling due to the      left atrial myxoma and there was 1-2+ mitral insufficiency noted by      central jet.  There were no torn chordae or prolapsing segments      noted.  4. Left ventricle.  The ventricular function appeared normal.  There      was good contractility of the left ventricular myocardium in all      segments interrogated.  The left ventricular end-diastolic diameter      measured 5.0 cm at the end diastole at the mid papillary level and      end-systolic diameter was 3.1 cm.  Ejection fraction was estimated      at 65%.  The left ventricular wall  thickness measured 0.9 cm at the      end diastole at the mid papillary level of the anterior and      posterior wall.  5. Right ventricle.  The right ventricular size was within normal      limits.  There was a good contractility of the right ventricular      free wall.  6. Tricuspid valve.  The tricuspid valve appeared structurally intact.      There was 1-2+ tricuspid insufficiency.  7. Interatrial septum.  The mass was noted to be originating in the      area of the interatrial septum, but there was no atrial septal      defect or patent foramen ovale appreciated.  8. Ascending aorta.  The ascending aorta appeared normal.  A well-      defined sinotubular ridge and aortic root and no atheromatous      disease noted.  9. Descending aorta.  The descending aorta appeared normal and      measured 2.3 cm in diameter with no significant atheromatous      disease noted.   Postbypass Findings:  1. Left atrium.  There was no residual tumor noted in the left atrial      cavity.  There was a thickening in the area of superior fossa      ovalis with some suture material, which appeared evident.  There      was some turbulence in the area of  the surgical repair, but no left-      to-right shunt could be identified.  2. Aortic valve.  The aortic valve was unchanged from the prebypass      study and appeared normal.  3. Mitral valve.  The mitral leaflets appeared well without prolapse      or fluttering and there was trace to 1+ mitral insufficiency noted.  4. Left ventricle.  The left ventricular function appeared normal.      There was good contractility in all segments interrogated.  The      ejection fraction was again estimated at 60-65%.  5. Right ventricle.  The right ventricular function appeared normal.      There was good contractility of the right ventricular free wall.  6. Tricuspid valve.  There was residual 1-2+ tricuspid insufficiency.           ______________________________  Guadalupe Maple, M.D.     DCJ/MEDQ  D:  12/24/2008  T:  12/25/2008  Job:  [id number]

## 2010-10-17 NOTE — Op Note (Signed)
NAME:  BRAYLIN, FORMBY NO.:  [id number]   MEDICAL RECORD NO.:  [id number]           PATIENT TYPE:   LOCATION:                                 FACILITY:   PHYSICIAN:  Guadalupe Maple, M.D.  DATE OF BIRTH:  09/17/1932   DATE OF PROCEDURE:  01/11/2009  DATE OF DISCHARGE:                               OPERATIVE REPORT   PROCEDURE:  Intraoperative transesophageal echocardiography.   [name] is a 77 year old white female who is status post left  atrial myxoma resection and was discharged home from the hospital and  returned with a right pleural effusion and pericardial effusion as well.  She is scheduled to undergo right thoracoscopy by Dr. Dorris Fetch and  possible drainage of the pericardial effusion as well.  Intraoperative  transesophageal echocardiography was requested to evaluate the  pericardial effusion and to assist with drainage.   The patient was brought to the operating room at [facility] and  general anesthesia was induced without difficulty.  The trachea was  intubated without difficulty.  The transesophageal echocardiography  probe was then inserted into the esophagus following orogastric  suctioning.   IMPRESSION:  1. Pericardium.  There was a moderate-sized pericardial effusion      present.  This was predominantly in the anterolateral and inferior      portions of the heart.  The right ventricle and right atrium had a      small amount of pericardial fluid overlaying them, and these      structures appeared to be adherent to the pleura.  The largest      extent of the pericardial effusion measured 1.5 to 2 cm anterior to      the left ventricle.  There was no evidence of right atrial or right      ventricular diastolic collapse.  2. Aortic valve.  The aortic valve was normal appearing.  It was      trileaflet and opened normally without aortic insufficiency.  3. Mitral valve.  There was trace to 1+ mitral insufficiency with a    central jet.  The leaflets were intact without evidence of torn      chordae or prolapsing segments, and there was good coaptation      noted.  4. Left ventricle.  The left ventricular cavity was underfilled, but      there was vigorous contractility in all segments and ejection      fraction was estimated at 70%.  5. Right ventricle.  The right ventricular cavity was underfilled as      well, but there was good contractility of the right ventricular      free wall.  6. Tricuspid valve.  The tricuspid valve showed trace to 1+ tricuspid      insufficiency and otherwise appeared normal.  7. Interatrial septum.  The interatrial septum showed evidence of a      previous mass resection with a small nubbin of tissue along the      left atrial side, but there was no evidence of atrial septal defect  noted.  8. Left atrium.  There was no thrombus noted in the left atrium or      left atrial appendage.   During the procedure, the pericardial fluid was drained and just a trace  amount of pericardial fluid remained with good filling of the left and  right ventricles.           ______________________________  Guadalupe Maple, M.D.     DCJ/MEDQ  D:  01/11/2009  T:  01/12/2009  Job:  [id number]

## 2010-10-17 NOTE — Op Note (Signed)
NAME:  Cassandra Romero, Cassandra Romero NO.:  [id number]   MEDICAL RECORD NO.:  [id number]          PATIENT TYPE:  INP   LOCATION:  2015                         FACILITY:  [facility]   PHYSICIAN:  Hillis Range, MD       DATE OF BIRTH:  Jan 20, 1933   DATE OF PROCEDURE:  DATE OF DISCHARGE:                               OPERATIVE REPORT   SURGEON:  Hillis Range, MD   PREPROCEDURE DIAGNOSES:  1. Sinus node dysfunction.  2. Tachycardia bradycardia syndrome.   POSTPROCEDURE DIAGNOSES:  1. Sinus node dysfunction.  2. Tachycardia bradycardia syndrome.  3. Right hemi-diaphragmatic paralysis.   PROCEDURE:  Dual-chamber pacemaker implantation.   INTRODUCTION:  Cassandra Romero is a pleasant 77 year old female with a  history of a large left atrial myxoma who is now recovering  postoperative day #6.  She has had significant bradycardias  postoperatively with only a junctional escape rhythm observed.  She has  required temporary pacing due to symptoms of bradycardia and  hypotension.  She has also been observed to have both atrial  fibrillation and atrial flutter requiring chronotropic agents due to  rapid ventricular rates.  She now presents for dual-chamber pacemaker  implantation.   DESCRIPTION OF PROCEDURE:  Informed written consent was obtained, and  the patient was brought to the Electrophysiology Lab in a fasting state.  She was adequately sedated with intravenous Versed and fentanyl as  outlined in the nursing report.  The patient's left chest was prepped  and draped in the usual sterile fashion by the EP lab staff.  The skin  overlying the left deltopectoral region was infiltrated with lidocaine  for local analgesia.  A 5-cm incision was made over the left  deltopectoral region.  A left subcutaneous pacemaker pocket was  fashioned using a combination of sharp and blunt dissection.  Electrocautery was used to assure hemostasis.  The left cephalic vein  was visualized and cannulated.   Through the left cephalic vein, a  Medtronic model [id number] (serial number [id number]) right atrial lead and  a Medtronic model [id number] (serial number [id number]) right ventricular  lead were advanced with fluoroscopic visualization into the lateral  right atrial free wall and right ventricular apex positions  respectively.  I chose to place the atrial lead into the lateral free  wall of the right atrium in order to avoid the patient's intra-atrial  patch and surgical incision made by Dr. Cornelius Moras.  During placement of the  atrial lead, the patient was observed to have a hemiparalysis of the  right hemi-diaphragm.  There is no diaphragmatic stimulation with pacing  from the right atrial lead at 10 volts.  The leads were then secured to  the pectoralis fascia over the suture sleeves.  Initial lead  measurements revealed an atrial lead P-wave of 3.2 millivolts with an  impedance of 544 ohms and a threshold of 1 volt at 0.5 milliseconds.  The right ventricular lead R-wave measured 9.2 millivolts with an  impedance of 932 ohms and a threshold of 1 volt at 0.5 milliseconds.  The leads were then connected to a Medtronic Adapta  L model ADDRL1  (serial number [id number]) dual-chamber pacemaker.  The pacemaker was  then placed into the pocket.  The pocket was irrigated with copious  gentamicin solution.  The pacemaker was then secured to the pectoralis  fascia using a single #2 silk suture.  The pocket was then closed in 2  layers with 2.0 Vicryl suture for the subcutaneous and subcuticular  layers.  Steri-Strips and a sterile dressing were then applied.  There  were no early apparent complications.   CONCLUSIONS:  1. Successful dual-chamber pacemaker implantation as described above      for sinus node dysfunction and tachycardia bradycardia syndrome.  2. No early apparent complications.      Hillis Range, MD  Electronically Signed     JA/MEDQ  D:  12/30/2008  T:  12/31/2008  Job:  [id number]    cc:   Salvatore Decent. Cornelius Moras, M.D.  Verne Carrow, MD  Luna Kitchens, MD

## 2010-10-17 NOTE — Assessment & Plan Note (Signed)
OFFICE VISIT   ARMYA, WESTERHOFF  DOB:  08-Sep-1932                                        June 27, 2009  CHART #:  [id number]   HISTORY OF PRESENT ILLNESS:  The patient comes in today for a 82-month  follow up.  She is status post a right mini thoracotomy for resection of  a left atrial myxoma on December 24, 2008.  Postoperatively, she did require  a dual-chamber pacemaker implantation.  She has been progressing very  nicely since her last visit.  She denies any dizziness or palpitations.  Her appetite continues to improve, and she has had no further nausea.  She still occasionally has some soreness along her incision sites but  overall this is improved.  She denies any shortness of breath or lower  extremity edema.  Her INR is being followed by her primary care  physician and she currently is taking 3.5 mg daily of Coumadin.  She  states that her next INR follow up is in the next week and that her most  recent INR was 1.7.  Also since her last visit, she was seen for pacer  recheck by Dr. Johney Frame and is doing very well.  Overall, she has no  complaints today.   PHYSICAL EXAMINATION:  Blood pressure is 117/76, pulse is 78,  respirations 16, O2 sat 93% on room air.  Right mini thoracotomy, chest  tube, and pacemaker incisions have all healed well.  Heart is regular  rate and rhythm without murmurs, rubs, or gallops.  Lungs are clear.  No  lower extremity edema.   DIAGNOSTIC TESTS:  Chest x-ray shows stable elevation of the right  hemidiaphragm.   ASSESSMENT AND PLAN:  The patient is progressing nicely status post  right mini thoracotomy for resection of the left atrial myxoma.  At this  point, I think we can see her back on an as-needed basis.  I will  discuss this with Dr. Cornelius Moras, and we will contact the patient if she  needs to return for further evaluation in our office.  She is asked to  continue her current medication regimen and follow up as directed  with  Dr. Johney Frame and for Coumadin rechecks.   Salvatore Decent. Cornelius Moras, M.D.  Electronically Signed   GC/MEDQ  D:  06/27/2009  T:  06/28/2009  Job:  [id number]   cc:   Brayton El, MD  Hillis Range, MD  Verne Carrow, MD

## 2010-10-17 NOTE — Assessment & Plan Note (Signed)
OFFICE VISIT   AMOR, HYLE  DOB:  06/11/1932                                        March 21, 2009  CHART #:  [id number]   HISTORY:  The patient returns to the office today for routine followup  approximately 3 months status post right miniature thoracotomy for  resection of left atrial myxoma.  She was last seen here in the office  on January 24, 2009.  Since then, the patient has done very well.  Her  problems with postoperative nausea stopped once all the offending  medications were discontinued.  She is now eating well and feeling well.  She has no shortness of breath.  She still has some residual soreness in  the right side of her chest wall but this continues to gradually  improve.  She has not had any tachy palpitations.  She has not had any  dizzy spells.  Overall, she feels well and seems to be getting along  without any further difficulty.  The remainder of her review of systems  unremarkable.   PHYSICAL EXAMINATION:  Notable for well-appearing female with blood  pressure 124/85, pulse 84 and regular, oxygen saturation 92% on room  air.  Examination of the chest reveals a miniature thoracotomy incision  that has healed nicely.  Auscultation reveals slightly diminished breath  sounds at the right lung base compared to the left.  Breath sounds are  otherwise clear.  No wheezes or rhonchi are noted.  Cardiovascular exam  is notable for regular rate and rhythm.  No murmurs, rubs, or gallops  are noted.  The abdomen is soft, nontender.  The extremities are warm  and well perfused.  There is no lower extremity edema.   IMPRESSION:  Satisfactory progress following right miniature thoracotomy  for resection of left atrial myxoma.  Overall, the patient seems to be  doing quite well.  She is not having any clinically significant episodes  of atrial dysrhythmias from the standpoint of no further symptoms of  tachy palpitations or dizziness.  Overall,  she looks fine.   PLAN:  We will have the patient return for final followup in  approximately 3 months.  We will get a repeat chest x-ray at that time.  We have not made any changes in her current medications.  All of her  questions have been addressed.  At this point, I have encouraged her to  continue to  gradually increase her physical activity as tolerated without any  particular physical limitations due to her previous surgery.   Salvatore Decent. Cornelius Moras, M.D.  Electronically Signed   CHO/MEDQ  D:  03/21/2009  T:  03/21/2009  Job:  [id number]   cc:   Brayton El, MD  Hillis Range, MD  Verne Carrow, MD

## 2010-10-17 NOTE — Discharge Summary (Signed)
NAME:  Cassandra Romero, RILL NO.:  [id number]   MEDICAL RECORD NO.:  [id number]          PATIENT TYPE:  INP   LOCATION:  2022                         FACILITY:  [facility]   PHYSICIAN:  Salvatore Decent. Cornelius Moras, M.D. DATE OF BIRTH:  September 14, 1932   DATE OF ADMISSION:  01/08/2009  DATE OF DISCHARGE:  01/18/2009                               DISCHARGE SUMMARY   ADMITTING DIAGNOSES:  1. Right pleural effusion.  2. Pericardial effusion.  3. Status post right miniature thoracotomy for resection of left      atrial myxoma with CorMatrix patch reconstruction of the intra-      atrial septum and posterior atrial wall by Dr. Cornelius Moras on November 24, 2008.  4. Status post dual-chamber pacemaker implantation on December 30, 2008      (for sinus node dysfunction and tachy-brady syndrome).  5. History of hyperlipidemia.  6. History of gastroesophageal reflux disease.   DISCHARGE DIAGNOSES:  1. Right pleural effusion.  2. Pericardial effusion.  3. Status post right miniature thoracotomy for resection of left      atrial myxoma with CorMatrix patch reconstruction of the intra-      atrial septum and posterior atrial wall by Dr. Cornelius Moras on November 24, 2008.  4. Status post dual-chamber pacemaker implantation on December 30, 2008      (for sinus node dysfunction and tachy-brady syndrome).  5. History of hyperlipidemia.  6. History of gastroesophageal reflux disease.   PROCEDURES:  1. Intraoperative transesophageal echocardiography on January 11, 2009.  2. Right video-assisted thoracic surgery, right mini-thoracotomy,      drainage of right pleural effusion, subxiphoid pericardial window      by Dr. Dorris Fetch on January 11, 2009.   HISTORY OF PRESENT ILLNESS:  This 77 year old Caucasian female who is  status post right miniature thoracotomy for resection of a left atrial  myxoma with CorMatrix patch reconstruction by Dr. Cornelius Moras on December 24, 2008.  Postoperatively, she had sinus node dysfunction as well as  tachy-brady  syndrome.  She underwent placement of a dual-chamber pacemaker by Dr.  Johney Frame on December 30, 2008.  During the procedure, Dr. Johney Frame did note that  there was some paralysis of the right hemidiaphragm seen by fluoroscopy  for lead placement.  The patient remained surgically stable and was  discharged on January 03, 2009.  According to medical records, she had  been doing fairly well on that Wednesday and Thursday.  Her only  complaint was constipation.  On Friday, however, she felt weaker and was  not able to use her incentive spirometer as before.  She also had lost  her appetite and became more short of breath and developed significant  orthopnea.  She presented to [facility].  A chest  x-ray done showed a large right pleural effusion.  The patient was then  transferred to [facility] for further evaluation and treatment.   Laboratory studies revealed her H and H to be 8.8 and 26.8 with an INR  of 3.1 and a BNP of 272.  Chest x-ray did showed postoperative changes  on the right, a right pleural effusion, elevation of the right  hemidiaphragm, as well as a slightly larger cardiac silhouette.  CT  angio of the chest was then obtained, which showed no pulmonary embolus,  a small-to-moderate pericardial effusion with mild hyperattenuation of  the pericardium, a moderate right pleural effusion that is partially  loculated and associated collapse consolidation of the right lower lobe.  The patient remained afebrile and still needed several liters of oxygen  via nasal cannula but maintained high O2 sats.  The patient was then  scheduled for a right mini-thoracotomy, drainage of right pleural  effusion, subxiphoid pericardial window.  This was done by Dr.  Dorris Fetch on January 11, 2009.  Prior to this procedure, however, an  intraop TEE was done, which did confirm a moderate-sized pericardial  effusion, trace to 1+ mitral insufficiency, and an EF of 70%.   BRIEF  HOSPITAL COURSE STAY:  The patient was extubated the afternoon of  postop day #1.  She did have a T-max of 101 but later became afebrile.  She remained hemodynamically stable.  She did require several liters of  oxygen postoperatively.  She was still on dopamine postoperatively, this  was weaned as tolerated.  Her pericardial drain was removed on January 13, 2009, as well as her A-line after she was weaned off dopamine.  She  did have some volume overload and was diuresed accordingly.  The patient  was transferred from the intensive care unit to [facility] for further  convalescence.  Her chest tube did have a fair amount of output couple  of days postoperatively, this eventually was removed on January 16, 2009.  The patient's Coumadin was restarted.  Her PT and INR monitored daily.  She was also on Lovenox, this is going to be discontinued when the INR  was equal to 2 or greater than 2.  The patient was also started on a  prednisone taper.  Today, she is afebrile, vital signs are stable, O2  sat is 95% on 3-1/2 liters.   PHYSICAL EXAMINATION:  CARDIOVASCULAR:  Regular rate and rhythm.  LUNGS:  Diminished at the bases, right greater than left.  ABDOMEN:  Benign.  EXTREMITIES:  Trace lower extremity edema.   LATEST LABORATORY STUDIES:  PT and INR 19.5 and 1.7 respectively.  BMET  done on January 15, 2009, potassium 4, BUN and creatinine were 20 and  0.76 respectively.  CBC done on this date H and H was 9.9 and 30.2  respectively, white count of 6200, platelet count of 340,000.  A BMET,  CBC, and BNP as well as mag level will be drawn in the a.m.  Last chest  x-ray done January 17, 2009, showed resolving right atelectasis and right  pleural effusion.  Left lung showed a stable small left pleural effusion  with minimal basilar atelectasis.  Provided the patient remains afebrile  and hemodynamically stable, she will be discharged home with home O2 on  January 17, 2009.      Doree Fudge,  PA      [place] H. Cornelius Moras, M.D.  Electronically Signed    DZ/MEDQ  D:  01/17/2009  T:  01/18/2009  Job:  [id number]

## 2010-10-17 NOTE — Assessment & Plan Note (Signed)
[facility]                        [facility] CARDIOLOGY OFFICE NOTE   NAME:Cassandra Romero, Cassandra Romero                       MRN:          [id number]  DATE:02/28/2009                            DOB:          Jun 03, 1933    PROBLEM LIST:  1. Atrial myxoma.  The patient was diagnosed with [place] in July 2010, and      underwent resection with an anterior atrial patch on December 24, 2008.      Her postop course was complicated by a pericardial effusion that      required subxiphoid window and right pleural effusion.  2. Tachybrady syndrome.  Postoperatively, from the atrial myxoma      resection, the patient had a atrial fibrillation with tachybrady      syndrome that required a pacemaker placement by Dr. Johney Frame.  She      was intolerant to amiodarone therapy and Toprol therapy.  However,      she has been able to tolerate Coreg and diltiazem.  3. Gastroesophageal reflux disease.  4. Osteopenia.  5. Status post cholecystectomy.  6. The patient had a left heart catheterization as part of her      preoperative workup that showed normal coronary arteries and a      normal ejection fraction.   INTERVAL HISTORY:  The patient states that since her last visit with Dr.  Clifton James on September 9, she has been doing well.  She specifically says  that over the past week, she has almost fell back to normal.  She  endorses very occasional, very fleeting substernal chest discomfort that  is not related to activity.  She states that she is able to walk around  her house and do her activities of daily living without any problems  whatsoever.  Again, she denies any dizziness, palpitations, syncope, or  lower extremity edema.  She is compliant with all of her medications.  She denies any issues with bleeding secondary to the Coumadin use.   ALLERGIES:  SKELAXIN, AMIODARONE, ALENDRONATE, ACTONEL, BONIVA, and  nutritional supplement called STATINS depletion.   CURRENT MEDICATIONS:  1.  Coumadin 2.5 mg as directed.  2. Coreg 6.25 mg b.i.d.  3. Diltiazem extended release beats 120 mg daily.  4. Omega III fatty acids 340 mg daily.  5. Calcium carbonate/vitamin D 600/400 mg daily.  6. Prilosec 20 mg daily.   FAMILY HISTORY:  Positive for premature coronary artery disease.   SOCIAL HISTORY:  History of tobacco use, but no longer smokes.   EKG in clinic today independently interpreted by myself demonstrates  normal sinus rhythm without any ST or T-wave abnormalities.   ASSESSMENT AND PLAN:  The patient is doing well from a cardiovascular  standpoint.  She is not having any signs or symptoms consistent with  congestive heart failure or pericardial effusion.  We recommend that she  continue on her current medical regimen as listed above and she is  tolerating it well.  She is not on aspirin for primary prevention and  does not need to be so especially as she is on Coumadin therapy.  She  will followup with Dr. Johney Frame in the [facility].  We would like to  check a repeat transthoracic echocardiogram in several months' time.  We  will see the patient back in clinic after this study is taken.     Brayton El, MD  Electronically Signed    SGA/MedQ  DD: 02/28/2009  DT: 03/01/2009  Job #: [id number]

## 2010-10-17 NOTE — Discharge Summary (Signed)
NAME:  Cassandra Romero, Cassandra Romero                ACCOUNT NO.:  [id number]   MEDICAL RECORD NO.:  [id number]          PATIENT TYPE:  INP   LOCATION:  2002                         FACILITY:  [facility]   PHYSICIAN:  Salvatore Decent. Cornelius Moras, M.D. DATE OF BIRTH:  [birth year]   DATE OF ADMISSION:  12/21/2008  DATE OF DISCHARGE:                               DISCHARGE SUMMARY   FINAL DIAGNOSIS:  Left atrial myxoma.   IN-HOSPITAL DIAGNOSES:  1. Acute blood loss anemia postoperatively.  2. Volume overload postoperatively.  3. Postoperative sinus node dysfunction.  4. Tachycardia-bradycardia syndrome with atrial fibrillation.   SECONDARY DIAGNOSES:  1. Hyperlipidemia.  2. Gastroesophageal reflux disease.  3. Osteopenia.  4. Status post cholecystectomy.  5. Status post appendectomy.  6. Status post partial hysterectomy.   IN-HOSPITAL OPERATIONS AND PROCEDURES:  1. Cardiac catheterization.  2. Intraoperative transesophageal echocardiogram.  3. Right miniature thoracotomy for resection of left atrial myxoma      with core matrix patch reconstruction of atrial septum and      posterior atrial wall.  4. Dual chamber pacemaker implantation.   HISTORY AND PHYSICAL AND HOSPITAL COURSE:  The patient is a 77 year old  female with no previous cardiac history who presents with sudden onset  chest units associated with mild abnormal cardiac enzymes.  Chest CT  scan performed to rule out pulmonary embolism revealed a mass in the  left atrium.  A 2-D echocardiogram confirmed similar findings and the  patient was subsequently was transferred to [facility]  for further management.  Transesophageal echocardiogram confirmed the  presence of mass in the left atrium with findings typical of left atrial  myxoma.  Mass was large, benign appearing, well circumcised, appears to  emanate from sulci rising from the superior rim of the fossa ovalis on  the interatrial septum.  Cardiac catheterization done revealed  normal  coronary artery anatomy with no significant coronary artery disease.  Following this, Dr. Cornelius Moras was consulted.  Dr. Cornelius Moras saw and evaluated the  patient.  He discussed with the patient undergoing a mini right  thoracotomy for resection of left atrial myxoma.  He discussed risks and  benefits with the patient.  The patient acknowledged understanding and  agreed to proceed.  Surgery was scheduled for December 24, 2008.  Preoperatively, the patient underwent bilateral carotid duplex  ultrasound showing no significant ICA stenosis.  She also had  preoperative ABIs done showing on the right to be 1.2 and left to be  1.22.  The patient remained stable preoperatively.  For further details  of the patient's past medical history and physical exam, please see  dictated H and P.   The patient was taken to the operating room on December 24, 2008, where she  underwent right miniature thoracotomy for resection of the left atrial  myxoma with core matrix patch reconstruction of interatrial septum and  posterior atrial wall.  The patient tolerated this procedure well and  transferred to the Intensive Care Unit in stable condition.  Postoperatively, the patient was noted to be hemodynamically stable.  She was extubated early morning postoperatively.  Post extubation, the  patient was noted to be alert and oriented x4, neuro intact.  Postoperatively, the patient was AAI-pacing.  Blood pressure was noted  to be stable.  All drips were able to be weaned and discontinued.  It  was noted on postop day #1 the patient had sinus node dysfunction.  She  remained pacing.  The patient's heart rate was monitored closely  postoperatively.  She was continued on backup pacer, but started having  tachycardia-brady symptoms with paroxysmal atrial fibrillation with  episodes of junctional rhythm.  Cardiology was following the patient,  felt that the patient may need a permanent pacemaker.  The patient was  started on IV  amiodarone and Cardizem.  Unfortunately, the patient  developed significant nausea.  It was felt that this may be secondary to  the amiodarone.  IV amiodarone was discontinued.  The patient's nausea  symptoms did resolve.  She remained on IV Cardizem.  The patient's  rhythm was not improving and her Cardizem was increased and she was  started on Lopressor.  Following given a dose of Lopressor, the patient  became hypotensive.  Lopressor was discontinued at that time.  The  patient required 1 bottle of albumin.  Blood pressure improved and she  stabilized.  EP was consulted for further evaluation for possible  permanent pacemaker implantation.  Dr. Johney Frame saw and evaluated the  patient on December 29, 2008.  He agreed that the patient would benefit from  permanent pacemaker implantation.  He discussed risks and benefits with  the patient.  The patient acknowledged understanding and agreed to  proceed.  She was taken to the Cardiac Cath Lab on December 30, 2008, where  she underwent placement of a dual chamber pacemaker.  She tolerated this  procedure well and was transferred back to her room following procedure.  The pacemaker was interrogated the following morning and working  appropriately.  Following placement of pacemaker, the patient was noted  to still be having episodes of atrial flutter/fibrillation.  She was  restarted on Lopressor and switched to p.o. Cardizem.  The patient  tolerated Lopressor well and it was changed over to Toprol-XL.  Currently, the patient's blood pressure is stable and she is A-paced in  the 80s.  During this time, the patient had been started on Coumadin.  Daily PT and INRs were obtained and Coumadin was adjusted appropriately.  Her current INR is 1.5.  Her external pacing wires were able to be  discontinued within normal fashion without any difficulty.   Post extubation, the patient was placed on nasal cannula.  Followup  chest x-ray was stable and the patient had  minimal drainage from chest  tubes and chest tubes were able to be discontinued in normal fashion.  Repeat chest x-ray remained stable.  The patient was encouraged to use  her incentive spirometer and she was able to be weaned off oxygen with  O2 sats greater than 90% on room air.  Postoperatively, the patient did  develop acute blood loss anemia.  On postop day #1,  hemoglobin/hematocrit dropped to 7.8 and 23%.  The patient received 1  unit of packed red blood cells.  Hemoglobin/hematocrit improved  appropriately following transfusion to 9.4 and 27.5.  Hemoglobin/hematocrit were followed closely and did remain stable.  The  patient did not require any further transfusions.  Postoperatively, the  patient did have some volume overload.  She was started on diuretics.  Daily weights were followed closely.  The patient's volume  overload  improving prior to discharge to home.  As stated above, the patient die  have some nausea postoperatively.  It did improve following  discontinuation of amiodarone.  The patient was able to tolerate a  regular diet.  Prior to discharge to home, the patient did start to have  several more episodes of nausea.  Currently, continued to follow.  Postoperatively, the patient's incisions were noted to be clean, dry,  and intact.  She was up ambulating well with cardiac rehab.  It was  recommended for arrangements for Home Health PT and RN.  This was  ordered.   On January 03, 2009, the patient's vital signs were noted to be stable.  She is A-pacing in the 3s.  Blood pressure stable.  Most recent lab  work showed INR of 1.5, sodium of 135, potassium 3.9, chloride of 98,  bicarbonate 28, BUN of 16, creatinine 0.83, and glucose of 100.  White  blood cell count 9.6, hemoglobin of 8.8, hematocrit 26.4, and platelet  count 392.  The patient's pathology report came back showing positive  for findings consistent with atrial myxoma.  The patient is tentatively  ready for  discharge to home in the a.m. pending she remained stable.   FOLLOWUP APPOINTMENTS:  A followup appointment has been arranged with  Dr. Cornelius Moras for January 17, 2009, at 1:15 p.m.  The patient will need to  obtain PA and lateral chest x-ray 30 minutes prior to this appointment.  The patient will need to follow up Dr. Gibson Ramp office in 2 weeks.  She will need to contact his office to make these arrangements.  The  patient will also need to follow up with Dr. Johney Frame as directed.  Home  health nurse has been arranged and will draw PT/INR blood work on January 06, 2009, and send results to Dr. Gibson Ramp office.   ACTIVITY:  The patient is instructed no driving until released to do so,  no heavy lifting over 10 pounds.  She is told to ambulate 3-4 times per  day, progress as tolerated and continue her breathing exercises.   INCISIONAL CARE:  The patient is told to shower washing her incisions  using soap and water.  She is to contact the office if she develops any  drainage or opening from any of her incision sites.   DIET:  The patient is educated on diet to be low fat, low salt.   DISCHARGE MEDICATIONS:  1. Aspirin 325 mg daily.  2. Toprol-XL 12.5 mg b.i.d.  3. Coumadin will be dosed based on the patient's discharge PT/INR      level.  4. Diltiazem 180 mg b.i.d.  5. Lasix 40 mg daily.  6. Potassium chloride 20 mEq daily.  7. Prilosec OTC daily.  8. Calcium and vitamin D b.i.d.  9. Ultram 50 mg 1-2 tablets q.4-6 p.r.n. pain.      Sol Blazing, PA      Salvatore Decent. Cornelius Moras, M.D.  Electronically Signed    KMD/MEDQ  D:  01/03/2009  T:  01/03/2009  Job:  [id number]   cc:   Hillis Range, MD

## 2010-10-17 NOTE — Letter (Signed)
February 28, 2009    Dr. Welton Flakes   RE:  [name], [name]  MRN:  [id number]  /  DOB:  [birth year]   Dear Dr. Welton Flakes:  I had the pleasure of seeing your patient, Cassandra Romero, in Cardiology  clinic today.  [name] is a 77 year old white female who was recently  diagnosed with a left atrial myxoma.  On December 24, 2008 the patient had a  resection of the myxoma at  [facility].  Her postoperative  course was complicated by tachy-brady syndrome which required pacemaker  insertion, pericardial effusion which required  a subxiphoid window, and  a pleural effusion.  I saw [name] in clinic today, and she is doing  very well.  Because of the paroxysmal atrial fibrillation she  experienced postop, she was placed on Coumadin for stroke prevention.  She would  like to have this monitored by your Coumadin Clinic with a  target INR between 2 and 3.  I will be checking a repeat echocardiogram  in several months' time.  I expect that she will do well.  Please feel  free to contact my office at any time with any questions or concerns.    Sincerely,      Brayton El, MD  Electronically Signed    SGA/MedQ  DD: 02/28/2009  DT: 02/28/2009  Job #: [id number]

## 2010-10-17 NOTE — Discharge Summary (Signed)
NAME:  MEA, OZGA NO.:  [id number]   MEDICAL RECORD NO.:  [id number]          PATIENT TYPE:  INP   LOCATION:  2022                         FACILITY:  [facility]   PHYSICIAN:  Salvatore Decent. Cornelius Moras, M.D. DATE OF BIRTH:  Jun 10, 1932   DATE OF ADMISSION:  01/08/2009  DATE OF DISCHARGE:                               DISCHARGE SUMMARY   ADDENDUM:  Discharge instructions are as follows:   ACTIVITY:  The patient may shower.  She is not to lift more than 10  pounds for 2 weeks.  She is not to drive for 2 weeks.  She is to  continue on a low-sodium, heart-healthy diet.   WOUND CARE:  She may use soap and water.  She may let her wounds open to  air.  She is to contact the office if any wound problems arise.   FOLLOWUP APPOINTMENTS:  1. She has an appointment to see Dr. Cornelius Moras on January 24, 2009 at 4:15      p.m. and prior to this office appointment, a chest x-ray will be      obtained.  2. The patient needs to contact Dr. Jenel Lucks office for followup      appointment in 2 weeks.  3. The patient will have her PT and INR drawn 48 hours after discharge      from the hospital.  Result will need to be faxed to Dr. Jenel Lucks      office for further Coumadin instructions.  Further instructions      include the patient is to continue with her breathing exercise      daily.  She is to walk every day and increase her frequency and      duration as tolerates.   Discharge medications at the time of this dictation are as follows:  1. Toprol-XL 12.5 mg p.o. daily.  2. Bayer non-aspirin heart health advantage one tablet p.o. twice      daily.  3. Calcium carbonate/vitamin D OTC one tablet p.o. twice daily.  4. Furosemide 40 mg p.o. daily.  5. Prednisone 10 mg p.o. daily x1 dose.  6. Potassium chloride 20 mEq p.o. daily.  7. Coumadin 2.5 mg p.o. q. evening or as directed.  8. Diltiazem CD/XT 180 mg p.o. two times daily.  9. Omega-3 fatty acids one capsule p.o. twice daily.  10.Omeprazole  20 mg p.o. daily.  11.Ultram 50 mg one to two tablets every 4-6 hours as needed.  12.Systane eye drops one to two drops in both eyes as needed.      Doree Fudge, PA      [place] H. Cornelius Moras, M.D.  Electronically Signed    DZ/MEDQ  D:  01/17/2009  T:  01/18/2009  Job:  [id number]   cc:   Hillis Range, MD

## 2010-10-17 NOTE — Op Note (Signed)
NAME:  Cassandra Romero, Cassandra Romero                ACCOUNT NO.:  [id number]   MEDICAL RECORD NO.:  [id number]          PATIENT TYPE:  INP   LOCATION:  2304                         FACILITY:  [facility]   PHYSICIAN:  Salvatore Decent. Cornelius Moras, M.D. DATE OF BIRTH:  1933-04-02   DATE OF PROCEDURE:  12/24/2008  DATE OF DISCHARGE:                               OPERATIVE REPORT   PREOPERATIVE DIAGNOSIS:  Left atrial myxoma.   POSTOPERATIVE DIAGNOSIS:  Left atrial myxoma.   PROCEDURE:  Right miniature thoracotomy for resection of left atrial  myxoma with CorMatrix patch reconstruction of intra-atrial septum and  posterior atrial wall.   SURGEON:  Salvatore Decent. Cornelius Moras, MD   ASSISTANT:  Stephanie Acre Dasovich, PA   ANESTHESIA:  General.   BRIEF CLINICAL NOTE:  The patient is a 77 year old female with no  previous cardiac history who presents with sudden onset chest pain  associated with mild abnormal cardiac enzymes.  Chest CT scan performed  to rule out pulmonary embolus reveals a mass within the left atrium. Two-  D echocardiogram confirms similar findings and the patient was  subsequently transferred to [facility] for further  management.  Transesophageal echocardiogram confirms the presence of  mass in the left atrium with findings typical of left atrial myxoma.  The mass was large, benign-appearing and well circumscribed and appears  to emanate from a stalk arising from the superior rim of the fossa  ovalis on the interatrial septum.  Cardiac catheterization reveals  normal coronary artery anatomy with no significant coronary artery  disease.  A full consultation note has been dictated previously.  The  patient and her husband have been counseled regarding the indications,  risks, and potential benefits of surgery.  Alternative treatment  strategies have been discussed and alternative surgical approaches have  been reviewed.  They provide full informed consent for the surgery as  described.  They  understand and accept all potential associated risks of  surgery including but not limited to risk of death, stroke, myocardial  infarction, congestive heart failure, respiratory failure, pneumonia,  bleeding requiring blood transfusion, arrhythmia, heart block with  bradycardia requiring permanent pacemaker, late recurrence of cardiac  tumor and possible embolization.  All of their questions have been  addressed.   OPERATIVE FINDINGS:  1. Large left atrial myxoma arising from the interatrial septum and      posterior atrial wall along the superior rim of the fossa ovalis.  2. Normal left ventricular systolic function.   OPERATIVE NOTE IN DETAIL:  The patient was brought to the operating room  on the above-mentioned date and central monitoring was established by  the anesthesia team under the care and direction of Dr. Kipp Brood.  Specifically, a Swan-Ganz catheter was placed through the left internal  jugular approach.  A radial arterial line was placed.  Intravenous  antibiotics were administered.  General endotracheal anesthesia was  induced uneventfully and the patient was initially intubated with a dual-  lumen endotracheal tube.  A Foley catheter was placed.  Baseline  transesophageal echocardiogram was performed by Dr. Noreene Larsson.  This  confirmed  the presence of a large mass filling almost the entire left  atrium which was smooth and well circumscribed and appeared to emanate  from the stalk where it was attached to the interatrial septum and  posterior atrial wall along the superior rim of the fossa ovalis.  Gross  findings were consistent with atrial myxoma.  There was moderate  pulmonary hypertension related to functional stenosis of the left atrium  related to the large mass.  There may be some mitral regurgitation,  although the functional anatomy of the mitral valve appeared essentially  normal.  There was normal left ventricular systolic function.  The  aortic valve was  normal.  There was mild-to-moderate tricuspid  regurgitation.   The patient was positioned with a soft roll behind the right scapula and  the neck gently extended and turned towards the left.  The patient's  right neck, chest, abdomen, both groins, and both lower extremities were  prepared and draped in sterile manner.  A small incision was made in the  right inguinal crease.  The anterior surface of the right common femoral  artery and right common femoral vein were dissected through this  incision.  The femoral artery was somewhat small-caliber but otherwise  normal and without any plaque.   Single lung ventilation was begun.  A right miniature anterior  thoracotomy was performed and the right pleural space was entered  through the third intercostal space.  A pledgeted Ethibond suture was  placed in the dome of the right hemidiaphragm and retracted inferiorly  to facilitate exposure.  The pericardium was opened longitudinally 3 cm  anterior to the phrenic nerve.  Silk traction sutures were placed in  either direction to facilitate exposure.   The patient was heparinized systemically.  Small Gore-Tex pursestring  sutures were placed on the anterior surface of the greater saphenous  bulb along the anterior surface of the right common femoral vein.  Two  concentric pursestring sutures were placed on the anterior surface of  the right common femoral artery.  The femoral vein was cannulated with  Seldinger technique through the greater saphenous bulb and a long  flexible guidewire was advanced up through the inferior vena cava  through the right atrium until the guidewire advanced into the superior  vena cava using transesophageal echocardiogram for guidance.  The  femoral vein was dilated with serial dilators and a 22-French long  femoral venous cannula was then advanced up over the guidewire through  the inferior vena cava through the right atrium until the tip of the  cannula extended  into the superior vena cava, again using  transesophageal echocardiogram for guidance.  The right common femoral  artery was cannulated with Seldinger technique and a flexible guidewire  was advanced into the descending thoracic aorta.  The femoral artery was  dilated with serial dilators and a 16-French femoral arterial cannula  was advanced over the guidewire into the iliac artery.  A small stab  incision was made low in the right neck.  The right internal jugular  vein was cannulated with a Seldinger technique and a flexible guidewire  was advanced into the right atrium.  The internal jugular vein was  dilated with serial dilators and a 14-French pediatric femoral venous  cannula was advanced over the guidewire into the superior vena cava.   Cardiopulmonary bypass was begun.  Vacuum assist venous drainage was  employed.  Venous drainage and exposure were notably excellent.  The  incision in the pericardium was  extended in both directions to  facilitate exposure.  Two pursestring sutures were placed on the  anterolateral surface of the proximal ascending thoracic aorta and an  antegrade cardioplegic cannula was placed into the aorta.  The patient  was cooled to 28 degrees systemic temperature.  Aortic cross-clamp was  applied and cold blood cardioplegia was delivered in an antegrade  fashion through the aortic root.  The initial cardioplegia was rapid  with excellent diastolic arrest.  Repeat doses of cardioplegia were  administered intermittently throughout the cross-clamp portion of the  operation in every 20 minutes antegrade through the aortic root.  Myocardial protection was felt to be excellent.   The oblique sinus was opened to expose the back wall of the left atrium.  Left atriotomy incision was performed posteriorly along the interatrial  groove.  Upon entering into the left atrium, one could immediately  appreciate the left atrial myxoma.  The incision was extended close to   the origin of the left inferior pulmonary vein due to proximity of the  attachment of the myxoma posteriorly once an adequate margin away from  the attachment of the myxoma was identified.  The incision in the left  atrium was then continued initially anteriorly and subsequently  posteriorly to leave a rim of normal atrial tissue away from the stalk  of the myxoma itself.  The incision was then continued across the  interatrial septum onto this fossa ovalis until the entire stalk of the  myxoma has been completely encircled.  At this juncture, the myxoma was  easily removed.  The myxoma was smooth and well circumscribed and there  was no sign of any friability or other retained myxoma tissue or  hematoma.  A portion of the atrial wall alongside the closest margin was  re-excised to ensure an adequate margin and this portion of the atrial  wall was sent as a separate specimen labeled interatrial septum and  atrial wall final margin.  Grossly it was clear that at least a 5 mm  margin away from the stalk of the atrial myxoma had been excised  circumferentially with the closest margin being immediately posterior at  the junction of the interatrial septum in the posterior wall of the  atrium.  The floor of the left atrium was now inspected and the mitral  valve was inspected.  The mitral valve appeared normal.  No other  abnormalities were noted.   A patch of CorMatrix bovine intestine submucosal tissue patch matrix was  soaked in saline for 10 minutes per manufacturer recommendations and  subsequently trimmed to an elliptical shape.  The interatrial septum and  posterior left atrial wall was now closed with a broad elliptical patch  of CorMatrix.  This was sewn circumferentially with running 3-0 Prolene  suture.  After this patch was completed an additional elliptical patch  was created to close the remaining right atrial wall without any  tension.  The overall defect in the interatrial  septum and posterior  atrial wall measured approximately 6 x 4 cm.  The defect in the right  atrial wall was approximately 2 x 3 cm.  Just prior to completion of the  closure of the interatrial septum, one final dose of warm hot shot  cardioplegia was administered and all air was evacuated.  The aortic  cross-clamp was removed after total cross-clamp time of 120 minutes.  Closure of the right atriotomy was now completed with a patch of  CorMatrix.  The heart began  to beat spontaneously without need for  cardioversion.  The antegrade cardioplegia site cardioplegic cannula was  removed and the cannulation site inspected for hemostasis.  Ventricular  epicardial pacing wires were placed into the undersurface of the right  ventricular free wall.  Epicardial pacing wires were fixed to the right  atrial appendage.  The patient was rewarmed to 37 degrees centigrade  temperature.  During rewarming, the EKG ST-segment rose transiently and  transesophageal echocardiogram demonstrated few air bubbles.  The  patient was continued on cardiopulmonary bypass until the EKG normalized  and the air was resolved.  Flows were partially decreased and the left  and right atrial suture lines were inspected for hemostasis.  Once  adequate surgical hemostasis had been ascertained, the patient was  weaned from cardiopulmonary bypass without difficulty.  The patient's  rhythm at separation from bypass was AV sequential paced.  The patient  was weaned from bypass on dopamine at 3 mcg/kg/minute.  Total  cardiopulmonary bypass time of the operation was 203 minutes.   Protamine was administered to reverse the anticoagulation.  The femoral  venous and arterial cannulas were both removed uneventfully.  After  closure of the pursestring sutures, there was a palpable pulse in the  distal right femoral artery.  The right internal jugular cannula was  removed and the cannulation site controlled with an everting pledgeted   suture under direct manual pressure.  There was moderate coagulopathy  appreciated.  Single lung ventilation was begun to facilitate direct  inspection of the atriotomy closures.  Pericardial space was drained  with a 28-French Bard drain exited through the anterior port incision.  The patient was transfused two packs of adult platelets and 4 units of  fresh frozen plasma for moderate coagulopathy which subsequently  resolved.  The pericardium was then closed using a patch of CorMatrix.  The right pleural space was irrigated with saline solution and again  inspected for hemostasis.  The right pleural space was drained with a 28-  French Bard drain exited through the posterior port incision.  The right  groin incision inspected for hemostasis and subsequently closed in  multiple layers.  The miniature thoracotomy incision was inspected for  hemostasis and subsequently closed in multiple layers.  All skin  incisions were closed with subcuticular skin closure.  All sponge,  instrument and needle counts were verified and correct at the completion  of the operation.  The patient was reintubated using a single-lumen  endotracheal tube and subsequently transported to the surgical intensive  care unit in stable condition.  There were no intraoperative  complications.  After removal of the drapes in the operating room, a  shallow 1.5 cm scratch was noted  on the anterior surface of the right thigh, presumably due to a small  laceration created at the time of suturing arterial cannulas into place.  This was repaired with Dermabond tissue glue.  The patient tolerated the  procedure well and was transported to surgical intensive care unit in  stable condition.      Salvatore Decent. Cornelius Moras, M.D.  Electronically Signed     CHO/MEDQ  D:  12/24/2008  T:  12/25/2008  Job:  [id number]   cc:   Verne Carrow, MD  Gypsy Balsam  Dr. Welton Flakes

## 2010-10-17 NOTE — Assessment & Plan Note (Signed)
OFFICE VISIT   Cassandra Romero, Cassandra Romero  DOB:  Apr 26, 1933                                        January 24, 2009  CHART #:  [id number]   The patient returns to the office today for followup status post right  miniature thoracotomy for resection of large left atrial myxoma with  CorMatrix patch reconstruction of the interatrial septum and posterior  left atrial wall.  Her initial postoperative recovery was notable for  prolonged postoperative nausea and vomiting as well as recurrent  paroxysmal atrial fibrillation, supraventricular tachycardia, and tachy-  brady syndrome that ultimately required placement of a dual-chamber  pacemaker by Dr. Hillis Range.  She gradually recovered and was  discharged home, only to be readmitted to the hospital with a small  pleural effusion and moderate-sized pericardial effusion associated with  shortness of breath.  She underwent repeat right video-assisted  thoracoscopic surgery for drainage of her pleural effusion and then  subxiphoid pericardial window by Dr. Dorris Fetch.  Following this  procedure, she has done much better, and she was discharged home last  week.  She returns to the office today for further followup.  She states  that she has still been somewhat nauseated since hospital discharge.  She had 1 brief dizzy spell when she was washing herself in the bathroom  several days ago.  She states that she is still eating some, but she is  not eating real well.  She has had mild residual soreness in her chest  and in her upper abdomen.  Her bowels are functioning.  She has not had  any cough.  She has not had any shortness of breath.  The remainder of  her review of systems are unremarkable.   CURRENT MEDICATIONS:  Aspirin, Coumadin, Toprol-XL, diltiazem CD, Lasix,  potassium, Prilosec, vitamin D, and calcium.   PHYSICAL EXAMINATION:  Vital Signs:  Notable for a well-appearing female  with blood pressure 113/82, pulse is 110  and regular, and oxygen  saturation 94% on room air.  Skin:  Somewhat dry.  Her mucous membranes  appear dry and her skin turgor is decreased.  Chest:  Auscultation of  the chest reveals clear breath sounds with a few crackles at the right  lung base.  All of her chest tube sutures are removed.  Cardiovascular:  Notable for somewhat elevated pulse, but regular rhythm; and no murmurs,  rubs or gallops noted.  Abdomen:  Soft and nontender.  Extremities:  Warm and well perfused.  There is no lower extremity edema in fact there  is decreased skin turgor as noted.   DIAGNOSTIC TEST:  Chest x-ray performed today is reviewed.  This  demonstrates improved aeration at the right lung base with no  significant pleural effusion and minimal residual right basilar  atelectasis.  No other significant abnormalities are noted.   IMPRESSION:  The patient appears to be progressing acceptably well at  this time, although I am concerned that she may actually be a little bit  dehydrated on physical examination.  Her pulse is elevated, but regular  and her mucous membranes are dry.  She has no shortness of breath and  otherwise seems to be getting along fairly well.  Her appetite is still  slow to come around.  She is scheduled to see Dr. Johney Frame for further  followup next week.  PLAN:  I have instructed the patient to go ahead and stop taking Lasix  and potassium.  If her pulse remains elevated at the time of her office  visit with Dr. Johney Frame next week, I am sure that he will plan to check  her pacemaker to see if she is still having any tachy arrhythmias and/or  make sure that her pacemaker is sensing and functioning appropriately.  Her beta-blocker and/or calcium channel blocker may need to be further  adjusted depending.  Hopefully, her nausea will continue to improve.  I  have instructed to go ahead and increase physical activity as tolerated  otherwise with the only exception fact that she should avoid  any heavy  lifting or straining for the time being, and she is probably not ready  to go back to driving an automobile.  All of her questions have been  addressed.  We will see her back in 6-8 weeks or sooner as needed if she  does not improve.   Salvatore Decent. Cornelius Moras, M.D.  Electronically Signed   CHO/MEDQ  D:  01/24/2009  T:  01/25/2009  Job:  [id number]   cc:   Verne Carrow, MD  Hillis Range, MD

## 2010-10-17 NOTE — H&P (Signed)
NAME:  Cassandra Romero, Cassandra Romero                ACCOUNT NO.:  [id number]   MEDICAL RECORD NO.:  [id number]          PATIENT TYPE:  INP   LOCATION:  2313                         FACILITY:  [facility]   PHYSICIAN:  Evelene Croon, M.D.     DATE OF BIRTH:  [birth year]   DATE OF ADMISSION:  01/08/2009  DATE OF DISCHARGE:                              HISTORY & PHYSICAL   REASON FOR ADMISSION:  Right pleural effusion and right lung atelectasis  or pneumonia with respiratory failure, status post mini right  thoracotomy for left atrial myxoma resection.   CLINICAL HISTORY:  Cassandra Romero is a 77 year old woman who had a left  atrial myxoma resection through a right mini thoracotomy incision by Dr.  Tressie Stalker on December 24, 2008.  Postoperatively, she had sinus node  dysfunction as well as some tachy-brady syndrome, and underwent  placement of a dual-chamber pacemaker by Dr. Hillis Range on December 30, 2008.  During that procedure, Dr. 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  After that procedure, the patient continued to improve and was  discharged this past Tuesday.  She said she was doing fairly well on  Wednesday and Thursday except for constipation.  On Friday, she noted  that she was weaker and could not use her incentive spirometer as well.  She lost her appetite and became short of breath and then developed  significant orthopnea when she lied down in the evening, so she went to  the [facility] ER.  Chest x-ray there reportedly showed a large  pleural effusion on the right side, and I was called by the emergency  room doctor to transfer her to [facility].   REVIEW OF SYSTEMS:  GENERAL:  She denies any fever or chills.  Her  appetite has been decreased over the past few days.  She does feel weak  in general.  EYES:  Negative.  ENT:  Negative.  ENDOCRINE:  She denies  diabetes or hypothyroidism.  CARDIOVASCULAR:  She denies any chest  pain  or pressure.  She has had shortness of breath over the past 24 hours.  She does report cough and sputum production, which is yellow.  She has  had no hemoptysis.  GI:  She denies nausea or vomiting.  She has had  poor appetite.  She has been very constipated.  She has not had a bowel  movement since discharge.  She denies melena or bright red blood per  rectum.  GU:  She denies dysuria or hematuria.  MUSCULOSKELETAL:  She  denies arthralgias or myalgias.  NEUROLOGICAL:  She denies any focal  weakness or numbness.  She denies dizziness or syncope.  HEMATOLOGICAL:  Negative.   ALLERGIES:  None.   PAST MEDICAL HISTORY:  Significant for:  1. Hyperlipidemia.  2. She has a history of gastroesophageal reflux.  3. She is status post left atrial myxoma resection as noted above.  4. She is status post cholecystectomy, appendectomy, and partial      hysterectomy.   MEDICATIONS PRIOR ADMISSION:  As  noted on her admission MAR.   SOCIAL HISTORY:  She is married and lives with her husband.  She has a  remote smoking history, but quit 20 years ago, and denies alcohol abuse.  She is here with her daughter today.   FAMILY HISTORY:  Negative.   PHYSICAL EXAMINATION:  VITAL SIGNS:  Her blood pressure is 105/65, pulse  of 89 and regular, and respiratory rate is 20 and unlabored.  She is  afebrile.  GENERAL:  She is a well-developed elderly white female, in no distress.  Her oxygen saturation on arrival was 100% on 100% non-rebreather mask.  She is now 100% saturated on 60%.  HEENT:  Normocephalic and atraumatic.  Pupils are equal and reactive to  light and accommodation.  Extraocular muscles are intact.  Throat is  clear.  NECK:  Normal carotid pulses bilaterally.  There are no bruits.  There  is no adenopathy or thyromegaly.  CARDIAC:  Regular rate and rhythm with normal S1 and S2.  There is no  murmur, rub, or gallop.  LUNGS:  Decreased breath sounds over the right lower lobe, which are   tubular.  Rales at the left base.  The right anterior thoracotomy  incision is healing well.  There is some ecchymosis over the chest.  ABDOMEN:  Active bowel sounds.  Abdomen is soft and nontender.  There  are no palpable masses or organomegaly.  EXTREMITIES:  Trace peripheral edema.  Her right groin incision is  healing well.  SKIN:  Warm and dry.  NEUROLOGIC:  Alert and oriented x3.  Motor and sensory exams grossly  normal.   LABORATORY EXAMINATION:  Normal electrolytes with BUN of 19 and  creatinine 0.87.  White blood cell count 12.3, hemoglobin 8.8,  hematocrit 26.8, and platelet count is 583,000.  Her INR is 3.1 with a  PTT of 49.  BNP is 272.  Chest x-ray was reviewed here and shows obvious  postoperative changes in the right chest.  There is some pleural  effusion as well as hypoaeration of the right lung.  There is some  elevation of right hemidiaphragm.  Left lung may have some airspace  disease in the lower lobe.   IMPRESSION:  Cassandra Romero has developed shortness of breath and hypoxemia,  status post right mini thoracotomy for left atrial myxoma resection.  It  is difficult on chest x-ray to determine the exact etiology, but I  suspect it is multifactorial.  I cannot tell by chest x-ray how much of  the opacities due to pleural effusion, how which is due to elevation of  her left hemidiaphragm, usual postoperative changes, atelectasis, or  possible pneumonia.  Her cardiac silhouette is also slightly larger than  it was on her previous chest x-ray.  We will obtain a CT angiogram of  the chest to rule out pulmonary embolism and to further assess the right  pleural space and right lung to determine whether there is significant  pleural effusion that needs to be drained.  This may also give [place] more  information about pneumonia in the lung and we will assess for  significant pericardial effusion, especially since she is on Coumadin.  We will start her on antibiotics in case there  is a component of  pneumonia here.  She has been afebrile, but her white blood cell count  is mildly elevated.  I discussed the plans with the patient and her  daughter, and they understand and agreed to proceed.  Evelene Croon, M.D.  Electronically Signed     BB/MEDQ  D:  01/08/2009  T:  01/08/2009  Job:  [id number]

## 2010-10-17 NOTE — Assessment & Plan Note (Signed)
[facility]                        [facility] OFFICE NOTE   NAME:Cassandra Romero, Cassandra Romero                       MRN:          [id number]  DATE:05/03/2009                            DOB:          [birth year]    PROBLEM LIST:  1. Atrial myxoma status post resection with an anterior atrial patch      on December 24, 2008, with a postop course complicated by pericardial      effusion required subxiphoid window.  2. Tachybrady syndrome postop from the myxoma resection.  The patient      had atrial fibrillation with tachybrady syndrome requiring      pacemaker placement.  The patient was intolerant to the amiodarone      and Toprol.  3. GERD.  4. Osteopenia.  5. Status post cholecystectomy.  6. Left heart catheterization as part for her preoperative workup      showing normal coronary arteries and normal EF.   INTERVAL HISTORY:  The patient states since her last visit she has been  doing well.  She is not having episodes significant chest discomfort,  although she does get short of breath relatively easily secondary to  deconditioning.  She is compliant with her medications and is having her  INRs monitored closely.  She does complain of some increased urinary  frequency.  However, she states that this does not occur every day, and  she denies any burning.  She states she will contact her primary care  doctor's office if it continues.  She also complains of some mild pain  in her right arm when lifting it above the horizontal and some  occasional right back pain which is reproduced with palpation.  Lastly,  she complains there are some abnormalities in her nail beds which she  feels that are new.   PHYSICAL EXAMINATION:  VITAL SIGNS:  Blood pressure 103/69, pulse 78,  saturating 94% on room air.  She weighs 151 pounds.  GENERAL:  No acute distress.  HEENT:  Nonfocal.  HEART:  Regular rate and rhythm with 1/6 systolic murmur heard at the  left sternal  border.  LUNGS:  Clear bilaterally.  ABDOMEN:  Soft, nontender, nondistended.  EXTREMITIES:  Without edema.  MUSCULOSKELETAL:  The patient has some tenderness to palpation inferior  to her right shoulder and in the inferior portion of her right side of  her back.   Her echocardiogram dated November 5, was reviewed.  There was no sign of  recurrence of atrial myxoma.  Her ejection fraction was between 55% and  60% and there was mild-to-moderate mitral regurgitation and moderate  tricuspid regurgitation.   ASSESSMENT AND PLAN:  The patient is doing well from a cardiac  standpoint.  There is no sign of recurrence for atrial myxoma.  She is  followed by Dr. Johney Frame in the EP clinic for the atrial fibrillation, and  she was recently taken off her diltiazem.  The patient will see her  primary care physician regarding of the increased urinary frequency  should it continue.  Regarding her complaints of some back pain and arm  pain,  recommend that she increase her mobility and if this discomfort  does not go away on its own, she should probably see physical therapy.  The patient is scheduled to see Dr. Johney Frame in July of next year.  She  should probably have echocardiograms on a yearly basis to monitor for  recurrence of the atrial myxoma.  The patient will contact our office if  any problems arise.     Brayton El, MD  Electronically Signed    SGA/MedQ  DD: 05/03/2009  DT: 05/04/2009  Job #: [id number]

## 2010-10-17 NOTE — Assessment & Plan Note (Signed)
[facility]                        [facility] OFFICE NOTE   NAME:Cassandra Romero, Cassandra Romero                       MRN:          [id number]  DATE:11/14/2009                            DOB:          1932-06-07    PROBLEMS LIST:  1. Atrial myxoma status post resection with anterior atrial patch on      December 24, 2008, with postop course complicated by pericardial      effusion, requiring subxiphoid window.  2. Tachybrady syndrome, postop from myxoma resection.  The patient had      atrial fibrillation with tachybrady syndrome, requiring pacemaker      placement.  3. Gastroesophageal reflux disease.  4. Osteopenia.  5. Status post cholecystectomy.  6. Left heart catheterization, showing normal coronary arteries as      part of her preoperative workup.   INTERVAL HISTORY:  The patient states since her last office visit, she  has been getting along fairly well.  She has been engaged in cardiac  rehab for the past week and perhaps has noticed some increased dyspnea  on exertion.  She is unsure whether this is because she is exerting  herself more or if she is actually more short of breath at baseline.  She denies any chest discomfort.  She does endorse some occasional lower  extremity edema that is perhaps slightly worse than previous.  She has  been compliant with her medications and was recently started on  triamterene and HCTZ by her primary care physician.   PHYSICAL EXAMINATION:  Today, her blood pressure is 115/75, pulse 77,  saturating 92% on room air.  She weighs 163 pounds which is 12 pounds  more than she weighed in November 2010.  GENERAL:  She is in no acute distress.  HEENT:  Normocephalic, atraumatic.  HEART:  Regular rate and rhythm without murmur, rub, or gallop.  LUNGS:  Clear bilaterally.  ABDOMEN:  Soft, nontender, nondistended.  EXTREMITIES:  Without edema.  SKIN:  Warm and dry.   ASSESSMENT AND PLAN:  The patient's increase in  dyspnea could certainly  be secondary to her initiation of cardiac rehabilitation.  However, it  is somewhat more concerning that she has had a significant weight gain  since the last office visit.  She does appear euvolemic on exam.  We  will proceed with rechecking a transthoracic echocardiogram to evaluate  for recurrence of atrial myxoma.  She follows up with Dr. Johney Frame on December 02, 2009, regarding her pacemaker.  Otherwise, we will see the patient  back in 6 months' time.    Brayton El, MD  Electronically Signed   SGA/MedQ  DD: 11/14/2009  DT: 11/15/2009  Job #: [id number]

## 2010-10-17 NOTE — Assessment & Plan Note (Signed)
[facility]                        [facility] OFFICE NOTE   NAME:Cassandra Romero, Cassandra Romero                       MRN:          [id number]  DATE:05/15/2010                            DOB:          1932/11/06    [name] is a 77 year old female who is here today for a followup  visit.  She has the following problem list:  1. Atrial myxoma status post resection with anterior atrial patch on      December 24, 2008.  Postoperative course was complicated by pericardial      effusion requiring pericardial window.  2. Tachy-brady syndrome postoperatively from myocardial resection.      She had atrial fibrillation with episodes of bradycardia as well.      She subsequently underwent permanent pacemaker placement.  3. Gastroesophageal reflux disease.  4. Osteopenia.  5. Status post cholecystectomy.  6. No evidence of coronary artery disease or cardiac catheterization      before myxoma resection.   INTERVAL HISTORY:  The patient overall has been feeling very well.  She  denies any chest pain, dyspnea, palpitations, dizziness, syncope, or  presyncope.  She was recently diagnosed with shingles and currently is  taking Valtrex.  She has been taking Coumadin as directed.  She is  complaining of some lesions in her nail bed with some pigmentation and  possible splinter hemorrhages.   PHYSICAL EXAMINATION:  VITAL SIGNS:  Weight is 164.8 pounds, blood  pressure is 126/79, pulse is 78, oxygen saturation is 95% on room air.  NECK:  Reveals no JVD or carotid bruits.  LUNGS:  Clear to auscultation.  HEART:  Regular rate and rhythm with no gallops or murmurs.  ABDOMEN:  Benign, nontender, nondistended.  EXTREMITIES:  With no clubbing, cyanosis, or edema.   IMPRESSION:  1. Status post atrial myxoma in July of 2010.  She did have an      echocardiogram done in July of 2011, which overall showed normal      left ventricular systolic function with no evidence of  recurrent      tumor.  I recommended checking an echocardiogram once a year.  She      will be due in July of 2012.  2. History of postoperative atrial fibrillation:  She seems to be in      sinus rhythm at this time.  If the patient has not had any      recurrent episodes of atrial fibrillation, then it might be      reasonable to stop warfarin and switch her to an antiplatelet      agent.  She is allergic to ASPIRIN.  I recommend that she discuss      this with Dr. Johney Frame.  She has a followup appointment in February.      In the meantime, we will continue with carvedilol 6.25 mg twice      daily.  The patient will follow up with me next year in July.  I did examine her nails.  There are some skin lesions and possible  hyperpigmentation and  splinter hemorrhages. This is likely  a localized demoatalogic issue and  does not seem to reflect a systemic illness. No signs or symptoms of  endocarditis.     Lorine Bears, MD  Electronically Signed    MA/MedQ  DD: 05/15/2010  DT: 05/16/2010  Job #: [id number]

## 2010-10-17 NOTE — H&P (Signed)
NAME:  Cassandra Romero, Cassandra Romero NO.:  [id number]   MEDICAL RECORD NO.:  [id number]          PATIENT TYPE:  INP   LOCATION:  2011                         FACILITY:  [facility]   PHYSICIAN:  Verne Carrow, MDDATE OF BIRTH:  1932-08-17   DATE OF ADMISSION:  12/21/2008  DATE OF DISCHARGE:                              HISTORY & PHYSICAL   PRIMARY CARDIOLOGIST:  Verne Carrow, MD (new).   PRIMARY CARE PHYSICIAN:  Dr. Peggye Ley.   CHIEF COMPLAINT:  Substernal chest pain with radiation to right upper  extremity.   HISTORY OF PRESENT ILLNESS:  Cassandra Romero is a 77 year old Caucasian  female with no known history of coronary artery disease, a negative  stress test per patient in 2006, risk factors including hyperlipidemia  without medication secondary to an intolerance to Lipitor and Zocor,  remote history of tobacco abuse (5 pack a year, quit 20 years ago),  presents with chest pain with radiation to the right upper extremity and  right axilla.  The patient was in her usual state of health until she  awoke this morning at approximately 4:30 a.m. with substernal and aching  chest pain with radiation to her right upper extremity and right axilla.  The patient reports the pain in her right upper extremity was moving  down her arm.  Pain in her substernal chest approximately 5/10 at worst  and resolved after about 30 minutes.  However, pain in her right upper  extremity/axilla was 6/10 and continued until she was seen at [facility] at about 7:15 a.m. and received sublingual nitroglycerin.  The  patient denies any associated symptoms.  On arrival at [facility], the patient's BP was 184/83 with a heart rate of 77.  Her EKG  was without acute ST-T wave changes.  A CT of the chest was negative for  PE, however, a filling defect was seen in the left atrium consistent  with an atrial myxoma or large thrombus.  Per the patient, a 2-D  echocardiogram was completed  and the mass was diagnosed as a presumed  myxoma.  However, no echocardiogram report or images are available to  me.  At this time, the patient's BP is back within normal limits.  Heart  rate in the 50s and stable and she is asymptomatic.   PAST MEDICAL HISTORY:  1. Gastroesophageal reflux disease.  2. Osteopenia.  3. S/p cholecystectomy.   SOCIAL HISTORY:  The patient lives in [place] with her husband.  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  She drinks less than 1 alcoholic beverage  per month.  She consumes less than 1 EtOH beverage per month and denies  illicit drug use.  She takes Omega-3 fatty acids approximately 2 g daily  as well as 1200 mg of calcium at a 1000 IU of vitamin D.  She has a  regular diet and does no regular exercise.  Mother deceased at age 79  with history of TIA and pancreatic cancer.  Father diagnosed with  coronary artery disease in his 27s,  deceased at age 72 from CAD.   REVIEW OF SYSTEMS:  Please see HPI, otherwise increased stress over the  last 2 years secondary to loss of child due to an accident.  All other  systems reviewed and were negative.   CODE STATUS:  Full.   ALLERGIES AND INTOLERANCES:  1. Intolerant to ASPIRIN in the past secondary to GI upset.  2. Intolerant to LIPITOR and ZOCOR in the past secondary to muscle      aches.   MEDICATIONS:  1. Prilosec 1 tablet 30 minutes prior to breakfast daily.  2. Estrace 0.05% vaginal cream applied 2 times weekly.  3. At [facility], she was given sublingual nitroglycerin x2.  4. One-inch Nitropaste.  5. Aspirin 325 mg.  6. Lopressor 25 mg x1 as well as 4 mg morphine IV x1.   PHYSICAL EXAMINATION:  VITAL SIGNS:  BP 116/78, pulse in the 50s and  stable, respirations 18, temperature 96.8 degrees Fahrenheit, O2  saturation 95% on room air, and weight is 71 kg.  GENERAL:  The patient is alert and oriented x3 in no apparent distress.   She is able to speak and move easily without respiratory distress.  HEENT:  Head is normocephalic, atraumatic.  Pupils are equal, round, and  reactive to light.  Extraocular muscles are intact.  Nares were patent  without discharge.  Oropharynx is without erythema or exudate.  NECK:  Supple without lymphadenopathy.  No JVD.  No thyromegaly.  No  bruits.  HEART:  Heart rate is regular with audible S1 and S2.  No clicks, rubs,  murmurs, or gallops.  Pulses are 2+ and equal in both upper and lower  extremities bilaterally.  LUNGS:  Clear to auscultation bilaterally.  SKIN:  No rashes, lesions, or petechiae.  ABDOMEN:  Soft, nontender, and nondistended.  Normal abdominal bowel  sounds.  No rebound or guarding.  No hepatosplenomegaly.  No pulsations.  EXTREMITIES:  No clubbing, cyanosis, or edema.  MUSCULOSKELETAL:  No joint deformity or effusions.  No spinal or CVA  tenderness.  NEUROLOGIC:  Cranial nerves II-XII are grossly intact.  However, the  patient does have decreased hearing bilaterally.  Strength is 5/5 in all  extremities and axial groups.  Normal sensation throughout and normal  cerebellar function.   RADIOLOGY:  CT of the chest showed:  1. No evidence of pulmonary embolism.  2. Large filling defects within the left atrium consistent with an      atrial myxoma or a large thrombus.  3. No acute cardiopulmonary disease.   Chest X-Ray:  No active disease.   EKG showed normal sinus rhythm at 63, no acute ST-T wave changes or  significant Q-waves, normal axis, and no evidence of hypertrophy.  PR  126, QRS 86, and QTc 416.  No prior tracing for comparison.   LABORATORY DATA:  WBC 11.0, HGB 11.7, and HCT 35.9.  Sodium 141,  potassium 4.4, chloride 105, CO2 26, BUN 13, creatinine 0.74, and  glucose 96.  Liver function tests are within normal limits.  First set  of point of cares were negative.  PT was 10.2 and INR 1.0.  Urinalysis  within normal limits except for 1+ occult  blood.   ASSESSMENT/PLAN:  Cassandra Romero is a 77 year old Caucasian female with no  known history of coronary artery disease, but with risk factors  including her age, history of hyperlipidemia, remote tobacco abuse  disorder (although 20 years ago and only 5 pack years), positive family  history,  and sedentary lifestyle presenting with chest pain.  The  patient will be admitted to telemetry as she is currently asymptomatic,  we will rule out:  1. Chest pain.  The patient will be admitted to telemetry as she is      currently asymptomatic, rule out ACS with serial cardiac enzymes,      secondary to sinus bradycardia.  Beta-blockers will not be      initiated at this time, begin low-dose pravastatin as the patient      has been intolerant to other statins in the past, full-strength      enteric-coated aspirin and she has been tolerating in the past to      non-enteric-coated aspirin, 1.5-inch Nitropaste q.8 h. as BP      tolerates, Protonix 40 mg p.o. now and 40 mg p.o. q.a.m. 30 minutes      prior to breakfast.  2. Left atrial mass? myxoma.  We will complete transesophageal      echocardiogram in the morning as well as planned for right and left      cardiac catheterization beside the anatomy secondary to be likely      need to remove the left atrial mass, presumed atrial myxomas.      Jarrett Ables, [facility]      Verne Carrow, MD  Electronically Signed    MS/MEDQ  D:  12/21/2008  T:  12/22/2008  Job:  [id number]

## 2010-12-07 ENCOUNTER — Encounter: Payer: Self-pay | Admitting: Cardiovascular Disease

## 2011-01-29 ENCOUNTER — Ambulatory Visit (INDEPENDENT_AMBULATORY_CARE_PROVIDER_SITE_OTHER): Payer: Medicare Other | Admitting: *Deleted

## 2011-01-29 ENCOUNTER — Encounter: Payer: Self-pay | Admitting: Internal Medicine

## 2011-01-29 DIAGNOSIS — I495 Sick sinus syndrome: Secondary | ICD-10-CM

## 2011-01-29 LAB — PACEMAKER DEVICE OBSERVATION
ATRIAL PACING PM: 86
BAMS-0001: 145 {beats}/min
BATTERY VOLTAGE: 2.79 V
RV LEAD AMPLITUDE: 11.2 mv

## 2011-05-02 ENCOUNTER — Encounter: Payer: Self-pay | Admitting: Internal Medicine

## 2011-05-02 ENCOUNTER — Other Ambulatory Visit: Payer: Self-pay

## 2011-05-03 ENCOUNTER — Ambulatory Visit (INDEPENDENT_AMBULATORY_CARE_PROVIDER_SITE_OTHER): Payer: Medicare Other | Admitting: *Deleted

## 2011-05-03 DIAGNOSIS — I495 Sick sinus syndrome: Secondary | ICD-10-CM

## 2011-05-03 DIAGNOSIS — I498 Other specified cardiac arrhythmias: Secondary | ICD-10-CM

## 2011-05-04 LAB — REMOTE PACEMAKER DEVICE
BAMS-0001: 145 {beats}/min
BATTERY VOLTAGE: 2.79 V
RV LEAD AMPLITUDE: 22.4 mv
RV LEAD IMPEDENCE PM: 647 Ohm
VENTRICULAR PACING PM: 0

## 2011-05-08 NOTE — Progress Notes (Signed)
Remote pacer check  

## 2011-05-31 ENCOUNTER — Encounter: Payer: Self-pay | Admitting: *Deleted

## 2011-08-09 ENCOUNTER — Encounter: Payer: Self-pay | Admitting: Internal Medicine

## 2011-08-09 ENCOUNTER — Ambulatory Visit (INDEPENDENT_AMBULATORY_CARE_PROVIDER_SITE_OTHER): Payer: Medicare Other | Admitting: Internal Medicine

## 2011-08-09 DIAGNOSIS — D219 Benign neoplasm of connective and other soft tissue, unspecified: Secondary | ICD-10-CM

## 2011-08-09 DIAGNOSIS — I495 Sick sinus syndrome: Secondary | ICD-10-CM

## 2011-08-09 DIAGNOSIS — I4891 Unspecified atrial fibrillation: Secondary | ICD-10-CM

## 2011-08-09 LAB — BASIC METABOLIC PANEL
Chloride: 103 mEq/L (ref 96–112)
Potassium: 3.7 mEq/L (ref 3.5–5.1)
Sodium: 139 mEq/L (ref 135–145)

## 2011-08-09 LAB — PROTIME-INR: INR: 1.8 ratio — ABNORMAL HIGH (ref 0.8–1.0)

## 2011-08-09 LAB — PACEMAKER DEVICE OBSERVATION
BAMS-0001: 145 {beats}/min
BATTERY VOLTAGE: 2.8 V
VENTRICULAR PACING PM: 0

## 2011-08-09 LAB — CBC WITH DIFFERENTIAL/PLATELET
Basophils Relative: 0.3 % (ref 0.0–3.0)
Eosinophils Relative: 1.1 % (ref 0.0–5.0)
HCT: 38.8 % (ref 36.0–46.0)
Lymphs Abs: 3.5 10*3/uL (ref 0.7–4.0)
MCV: 86.5 fl (ref 78.0–100.0)
Monocytes Absolute: 0.8 10*3/uL (ref 0.1–1.0)
RBC: 4.49 Mil/uL (ref 3.87–5.11)
WBC: 8.5 10*3/uL (ref 4.5–10.5)

## 2011-08-09 MED ORDER — RIVAROXABAN 20 MG PO TABS: 20.0000 mg | ORAL_TABLET | Freq: Every day | ORAL | Status: DC

## 2011-08-09 NOTE — Progress Notes (Signed)
PCP:  Purcell Nails, MD, MD  The patient presents today for routine electrophysiology followup.  Since last being seen in our clinic, the patient reports doing very well.  She is unaware of any further afib. Today, she denies symptoms of palpitations, chest pain, shortness of breath, orthopnea, PND, lower extremity edema, dizziness, presyncope, syncope, or neurologic sequela.  The patient feels that she is tolerating medications without difficulties and is otherwise without complaint today.   Past Medical History  Diagnosis Date  . Atrial myxoma     s/p resection  . Tachy-brady syndrome     s/p PPM  . GERD (gastroesophageal reflux disease)   . Osteopenia   . Paroxysmal atrial fibrillation    Past Surgical History  Procedure Date  . Cholecystectomy   . Atrial myxoma excision 12/24/08    Left -- Dr Cornelius Moras  . Pacemaker insertion   . Appendectomy   . Partial hysterectomy     Current Outpatient Prescriptions  Medication Sig Dispense Refill  . acetaminophen (TYLENOL) 325 MG tablet Take 650 mg by mouth every 6 (six) hours as needed.        Marland Kitchen amoxicillin (AMOXIL) 500 MG capsule Take 2,000 mg by mouth once. 4 tablets before dental procedures       . Calcium Carbonate-Vitamin D (CALCIUM 600+D) 600-400 MG-UNIT per tablet Take 1 tablet by mouth 2 (two) times daily.        . carvedilol (COREG) 6.25 MG tablet Take 6.25 mg by mouth 2 (two) times daily with a meal.        . cyanocobalamin 100 MCG tablet Take 100 mcg by mouth daily.        Marland Kitchen triamterene-hydrochlorothiazide (MAXZIDE) 75-50 MG per tablet Take 0.25 tablets by mouth daily.        Marland Kitchen warfarin (COUMADIN) 2.5 MG tablet Take 3 mg by mouth as directed.       Marland Kitchen omeprazole (PRILOSEC) 20 MG capsule Take 20 mg by mouth daily.          Allergies  Allergen Reactions  . Alendronate Sodium     REACTION: extreme fatigue/vision change  . Amiodarone Hcl     REACTION: nausea/vomiting  . Aspirin   . Ibandronate Sodium     REACTION: extreme  fatigue/vision changes  . Metaxalone     REACTION: rash  . Risedronate Sodium     REACTION: extreme fatigue/vision    History   Social History  . Marital Status: Married    Spouse Name: N/A    Number of Children: N/A  . Years of Education: N/A   Occupational History  . retired Runner, broadcasting/film/video    Social History Main Topics  . Smoking status: Former Smoker    Quit date: 06/04/1988  . Smokeless tobacco: Not on file  . Alcohol Use: Yes     maybe 1 a month  . Drug Use: No  . Sexually Active: Not on file   Other Topics Concern  . Not on file   Social History Narrative   Lives in [place]    Family History  Problem Relation Age of Onset  . Pancreatic cancer    . Coronary artery disease      Physical Exam: Filed Vitals:   08/09/11 1123  BP: 120/60  Pulse: 61  Resp: 18  Height: 5\' 6"  (1.676 m)  Weight: 162 lb 6.4 oz (73.664 kg)    GEN- The patient is well appearing, alert and oriented x 3 today.   Head- normocephalic, atraumatic  Eyes-  Sclera clear, conjunctiva pink Ears- hearing intact Oropharynx- clear Neck- supple, no JVP Lymph- no cervical lymphadenopathy Lungs- Clear to ausculation bilaterally, normal work of breathing Chest- pacemaker pocket is well healed Heart- Regular rate and rhythm, no murmurs, rubs or gallops, PMI not laterally displaced GI- soft, NT, ND, + BS Extremities- no clubbing, cyanosis, or edema  Pacemaker interrogation- reviewed in detail today,  See PACEART report  Assessment and Plan:

## 2011-08-09 NOTE — Assessment & Plan Note (Signed)
Maintaining sinus rhythm No afib by recent PPM interrogation CHADS2 score is 2 She wishes to stop coumadin and start xarelto due to concerns for increased bleeding with coumadin.  We will stop coumadin and start xarelto once INR<2 CBC, INR, and BMET today

## 2011-08-09 NOTE — Assessment & Plan Note (Signed)
Repeat echo

## 2011-08-09 NOTE — Assessment & Plan Note (Signed)
Normal pacemaker function See Pace Art report No changes today  

## 2011-08-09 NOTE — Patient Instructions (Addendum)
Your physician wants you to follow-up in: 12 months with Dr Jacquiline Doe will receive a reminder letter in the mail two months in advance. If you don't receive a letter, please call our office to schedule the follow-up appointment.   Remote monitoring is used to monitor your Pacemaker of ICD from home. This monitoring reduces the number of office visits required to check your device to one time per year. It allows [place] to keep an eye on the functioning of your device to ensure it is working properly. You are scheduled for a device check from home on 11/08/11. You may send your transmission at any time that day. If you have a wireless device, the transmission will be sent automatically. After your physician reviews your transmission, you will receive a postcard with your next transmission date.  Your physician has requested that you have an echocardiogram. Echocardiography is a painless test that uses sound waves to create images of your heart. It provides your doctor with information about the size and shape of your heart and how well your heart's chambers and valves are working. This procedure takes approximately one hour. There are no restrictions for this procedure.     Your physician has recommended you make the following change in your medication:  1) Start Xarelto 20 mg daily-  Will call you if your blood works shows we need to decrease dose to 15mg  2) Stop Warfarin(Coumadin)  Your physician recommends that you return for lab work today  BMP/CBC/INR

## 2011-08-13 ENCOUNTER — Telehealth: Payer: Self-pay | Admitting: Internal Medicine

## 2011-08-13 NOTE — Telephone Encounter (Signed)
She is going to go back on the Prilosec because of indigestion with the Xarelto. She will call back if it does not subside

## 2011-08-13 NOTE — Telephone Encounter (Signed)
Pt is on her last pill of xerolto and she has some questions about it before you make the decision to take it and get more

## 2011-08-21 ENCOUNTER — Other Ambulatory Visit: Payer: Self-pay

## 2011-08-21 ENCOUNTER — Ambulatory Visit (HOSPITAL_COMMUNITY): Payer: Medicare Other | Attending: Cardiology

## 2011-08-21 DIAGNOSIS — I4891 Unspecified atrial fibrillation: Secondary | ICD-10-CM

## 2011-08-21 DIAGNOSIS — I495 Sick sinus syndrome: Secondary | ICD-10-CM | POA: Insufficient documentation

## 2011-09-06 ENCOUNTER — Telehealth: Payer: Self-pay | Admitting: *Deleted

## 2011-09-06 NOTE — Telephone Encounter (Signed)
I was calling patient regarding her echo report.  I had previously talked with her on 08/13/11 and she was c/o indigestion with the Xarelto.  I suggested she re-start Prilosec daily to see if this helps.  Today she is c/o bleeding when she wipes after loose stools and still having the GI problems.  She has not started the Prilosec yet.  She wants to know if she should stay on the Xarelto or go back to the Warfarin.  She started the Xarelto because she wanted to be able to eat her green leafy vegetables

## 2011-10-01 ENCOUNTER — Encounter: Payer: Self-pay | Admitting: Internal Medicine

## 2011-11-08 ENCOUNTER — Ambulatory Visit (INDEPENDENT_AMBULATORY_CARE_PROVIDER_SITE_OTHER): Payer: Medicare Other | Admitting: *Deleted

## 2011-11-08 ENCOUNTER — Encounter: Payer: Self-pay | Admitting: Internal Medicine

## 2011-11-08 DIAGNOSIS — I4891 Unspecified atrial fibrillation: Secondary | ICD-10-CM

## 2011-11-08 DIAGNOSIS — I495 Sick sinus syndrome: Secondary | ICD-10-CM

## 2011-11-14 LAB — REMOTE PACEMAKER DEVICE
BAMS-0001: 145 {beats}/min
RV LEAD AMPLITUDE: 16 mv
RV LEAD THRESHOLD: 1 V
VENTRICULAR PACING PM: 0

## 2011-11-27 ENCOUNTER — Encounter: Payer: Self-pay | Admitting: *Deleted

## 2012-02-18 ENCOUNTER — Ambulatory Visit (INDEPENDENT_AMBULATORY_CARE_PROVIDER_SITE_OTHER): Payer: Medicare Other | Admitting: *Deleted

## 2012-02-18 ENCOUNTER — Encounter: Payer: Self-pay | Admitting: Internal Medicine

## 2012-02-18 DIAGNOSIS — I495 Sick sinus syndrome: Secondary | ICD-10-CM

## 2012-02-20 LAB — REMOTE PACEMAKER DEVICE
AL IMPEDENCE PM: 472 Ohm
BAMS-0001: 145 {beats}/min
BATTERY VOLTAGE: 2.79 V
RV LEAD AMPLITUDE: 22.4 mv
RV LEAD IMPEDENCE PM: 602 Ohm
VENTRICULAR PACING PM: 0

## 2012-03-07 ENCOUNTER — Encounter: Payer: Self-pay | Admitting: *Deleted

## 2012-05-26 ENCOUNTER — Ambulatory Visit (INDEPENDENT_AMBULATORY_CARE_PROVIDER_SITE_OTHER): Payer: Medicare Other | Admitting: *Deleted

## 2012-05-26 DIAGNOSIS — Z950 Presence of cardiac pacemaker: Secondary | ICD-10-CM

## 2012-05-26 DIAGNOSIS — I495 Sick sinus syndrome: Secondary | ICD-10-CM

## 2012-06-02 LAB — REMOTE PACEMAKER DEVICE
AL IMPEDENCE PM: 441 Ohm
AL THRESHOLD: 0.625 V
ATRIAL PACING PM: 91
BATTERY VOLTAGE: 2.79 V
RV LEAD IMPEDENCE PM: 590 Ohm
RV LEAD THRESHOLD: 1 V

## 2012-06-10 ENCOUNTER — Encounter: Payer: Self-pay | Admitting: *Deleted

## 2012-06-12 ENCOUNTER — Encounter: Payer: Self-pay | Admitting: Internal Medicine

## 2012-08-15 ENCOUNTER — Ambulatory Visit (INDEPENDENT_AMBULATORY_CARE_PROVIDER_SITE_OTHER): Payer: Medicare Other | Admitting: Internal Medicine

## 2012-08-15 ENCOUNTER — Encounter: Payer: Self-pay | Admitting: Internal Medicine

## 2012-08-15 VITALS — BP 134/77 | HR 73 | Ht 65.0 in | Wt 164.1 lb

## 2012-08-15 DIAGNOSIS — I495 Sick sinus syndrome: Secondary | ICD-10-CM

## 2012-08-15 DIAGNOSIS — I4891 Unspecified atrial fibrillation: Secondary | ICD-10-CM

## 2012-08-15 DIAGNOSIS — D219 Benign neoplasm of connective and other soft tissue, unspecified: Secondary | ICD-10-CM

## 2012-08-15 LAB — PACEMAKER DEVICE OBSERVATION
AL THRESHOLD: 0.75 V
ATRIAL PACING PM: 91
RV LEAD THRESHOLD: 0.75 V
VENTRICULAR PACING PM: 0

## 2012-08-15 NOTE — Patient Instructions (Addendum)
Your physician wants you to follow-up in: 12 months with Dr Jacquiline Doe will receive a reminder letter in the mail two months in advance. If you don't receive a letter, please call our office to schedule the follow-up appointment.    Remote monitoring is used to monitor your Pacemaker of ICD from home. This monitoring reduces the number of office visits required to check your device to one time per year. It allows [place] to keep an eye on the functioning of your device to ensure it is working properly. You are scheduled for a device check from home on 11/17/12. You may send your transmission at any time that day. If you have a wireless device, the transmission will be sent automatically. After your physician reviews your transmission, you will receive a postcard with your next transmission date.  Your physician has requested that you have an echocardiogram. Echocardiography is a painless test that uses sound waves to create images of your heart. It provides your doctor with information about the size and shape of your heart and how well your heart's chambers and valves are working. This procedure takes approximately one hour. There are no restrictions for this procedure.

## 2012-08-15 NOTE — Progress Notes (Signed)
PCP:  Lise Auer, MD  The patient presents today for routine electrophysiology followup.  Since last being seen in our clinic, the patient reports doing very well.  She developed bleeding and profound anemia with xarelto and this was discontinued.  She has done well since.  She has had no afib.  Today, she denies symptoms of palpitations, chest pain, shortness of breath, orthopnea, PND, lower extremity edema, dizziness, presyncope, syncope, or neurologic sequela.  The patient feels that she is tolerating medications without difficulties and is otherwise without complaint today.   Past Medical History  Diagnosis Date  . Atrial myxoma     s/p resection  . Tachy-brady syndrome     s/p PPM  . GERD (gastroesophageal reflux disease)   . Osteopenia   . Paroxysmal atrial fibrillation    Past Surgical History  Procedure Laterality Date  . Cholecystectomy    . Atrial myxoma excision  12/24/08    Left -- Dr Cornelius Moras  . Pacemaker insertion    . Appendectomy    . Partial hysterectomy      Current Outpatient Prescriptions  Medication Sig Dispense Refill  . acetaminophen (TYLENOL) 325 MG tablet Take 650 mg by mouth every 6 (six) hours as needed.        Marland Kitchen amoxicillin (AMOXIL) 500 MG capsule Take 2,000 mg by mouth once. 4 tablets before dental procedures       . Calcium Carbonate-Vitamin D (CALCIUM 600+D) 600-400 MG-UNIT per tablet Take 1 tablet by mouth 2 (two) times daily.        . carvedilol (COREG) 6.25 MG tablet Take 6.25 mg by mouth. Take half a tablet twice daily      . cyanocobalamin 100 MCG tablet Take 100 mcg by mouth daily.        Marland Kitchen omeprazole (PRILOSEC) 20 MG capsule Take 20 mg by mouth daily.        . [DISCONTINUED] warfarin (COUMADIN) 2.5 MG tablet Take 3 mg by mouth as directed.        No current facility-administered medications for this visit.    Allergies  Allergen Reactions  . Alendronate Sodium     REACTION: extreme fatigue/vision change  . Amiodarone Hcl     REACTION:  nausea/vomiting  . Aspirin   . Ibandronate Sodium     REACTION: extreme fatigue/vision changes  . Metaxalone     REACTION: rash  . Risedronate Sodium     REACTION: extreme fatigue/vision    History   Social History  . Marital Status: Married    Spouse Name: N/A    Number of Children: N/A  . Years of Education: N/A   Occupational History  . retired Runner, broadcasting/film/video    Social History Main Topics  . Smoking status: Former Smoker    Quit date: 06/04/1988  . Smokeless tobacco: Not on file  . Alcohol Use: Yes     Comment: maybe 1 a month  . Drug Use: No  . Sexually Active: Not on file   Other Topics Concern  . Not on file   Social History Narrative   Lives in [place]    Family History  Problem Relation Age of Onset  . Pancreatic cancer    . Coronary artery disease      Physical Exam: Filed Vitals:   08/15/12 0914  BP: 134/77  Pulse: 73  Height: 5\' 5"  (1.651 m)  Weight: 164 lb 1.9 oz (74.444 kg)    GEN- The patient is well appearing, alert and oriented  x 3 today.   Head- normocephalic, atraumatic Eyes-  Sclera clear, conjunctiva pink Ears- hearing intact Oropharynx- clear Neck- supple, no JVP Lymph- no cervical lymphadenopathy Lungs- Clear to ausculation bilaterally, normal work of breathing Chest- pacemaker pocket is well healed Heart- Regular rate and rhythm, no murmurs, rubs or gallops, PMI not laterally displaced GI- soft, NT, ND, + BS Extremities- no clubbing, cyanosis, or edema  Pacemaker interrogation- reviewed in detail today,  See PACEART report ekg today reveals atrial pacing at 76 bpm, incomplete RBBB Echo 3/13 reviewed  Assessment and Plan:  1. Bradycardia Normal pacemaker function See Pace Art report No changes today carelink every 3 months  2. afib No episodes in last 12 months Anticoagulation stopped due to bleeding  3. Myxoma Doing well s/p resection Repeat echo for follow-up

## 2012-08-25 ENCOUNTER — Ambulatory Visit (HOSPITAL_COMMUNITY): Payer: Medicare Other | Attending: Cardiovascular Disease

## 2012-08-25 DIAGNOSIS — I4891 Unspecified atrial fibrillation: Secondary | ICD-10-CM | POA: Insufficient documentation

## 2012-08-25 DIAGNOSIS — Z87891 Personal history of nicotine dependence: Secondary | ICD-10-CM | POA: Insufficient documentation

## 2012-08-25 NOTE — Progress Notes (Signed)
Echocardiogram performed.  

## 2012-09-01 ENCOUNTER — Telehealth: Payer: Self-pay | Admitting: Internal Medicine

## 2012-09-01 NOTE — Telephone Encounter (Signed)
PT AWARE OF ECHO RESULTS./CY 

## 2012-09-01 NOTE — Telephone Encounter (Signed)
Follow Up   Calling in following up on ECHO. Would like to speak to nurse.

## 2012-11-17 ENCOUNTER — Ambulatory Visit (INDEPENDENT_AMBULATORY_CARE_PROVIDER_SITE_OTHER): Payer: Medicare Other | Admitting: *Deleted

## 2012-11-17 DIAGNOSIS — I495 Sick sinus syndrome: Secondary | ICD-10-CM

## 2012-11-17 DIAGNOSIS — Z950 Presence of cardiac pacemaker: Secondary | ICD-10-CM

## 2012-11-23 LAB — REMOTE PACEMAKER DEVICE
AL IMPEDENCE PM: 472 Ohm
AL THRESHOLD: 0.625 V
BATTERY VOLTAGE: 2.79 V
RV LEAD AMPLITUDE: 16 mv
RV LEAD IMPEDENCE PM: 598 Ohm
RV LEAD THRESHOLD: 0.875 V

## 2012-12-01 ENCOUNTER — Encounter: Payer: Self-pay | Admitting: *Deleted

## 2012-12-04 ENCOUNTER — Encounter: Payer: Self-pay | Admitting: Internal Medicine

## 2013-02-23 ENCOUNTER — Ambulatory Visit (INDEPENDENT_AMBULATORY_CARE_PROVIDER_SITE_OTHER): Payer: Medicare Other | Admitting: *Deleted

## 2013-02-23 DIAGNOSIS — Z950 Presence of cardiac pacemaker: Secondary | ICD-10-CM

## 2013-02-23 DIAGNOSIS — I495 Sick sinus syndrome: Secondary | ICD-10-CM

## 2013-02-28 LAB — REMOTE PACEMAKER DEVICE
AL THRESHOLD: 0.625 V
ATRIAL PACING PM: 88
BATTERY VOLTAGE: 2.79 V
VENTRICULAR PACING PM: 0

## 2013-03-03 ENCOUNTER — Encounter: Payer: Self-pay | Admitting: *Deleted

## 2013-03-10 ENCOUNTER — Encounter: Payer: Self-pay | Admitting: Internal Medicine

## 2013-05-25 ENCOUNTER — Ambulatory Visit (INDEPENDENT_AMBULATORY_CARE_PROVIDER_SITE_OTHER): Payer: Medicare Other | Admitting: *Deleted

## 2013-05-25 DIAGNOSIS — I4891 Unspecified atrial fibrillation: Secondary | ICD-10-CM

## 2013-05-25 DIAGNOSIS — I495 Sick sinus syndrome: Secondary | ICD-10-CM

## 2013-06-09 LAB — MDC_IDC_ENUM_SESS_TYPE_REMOTE
Battery Impedance: 225 Ohm
Battery Remaining Longevity: 114 mo
Battery Voltage: 2.79 V
Brady Statistic AP VP Percent: 0 %
Brady Statistic AP VS Percent: 87 %
Brady Statistic AS VP Percent: 0 %
Brady Statistic AS VS Percent: 13 %
Date Time Interrogation Session: 20141222205003
Lead Channel Impedance Value: 472 Ohm
Lead Channel Impedance Value: 664 Ohm
Lead Channel Pacing Threshold Amplitude: 0.625 V
Lead Channel Pacing Threshold Amplitude: 0.875 V
Lead Channel Pacing Threshold Pulse Width: 0.4 ms
Lead Channel Pacing Threshold Pulse Width: 0.4 ms
Lead Channel Sensing Intrinsic Amplitude: 22.4 mV
Lead Channel Setting Pacing Amplitude: 2 V
Lead Channel Setting Pacing Amplitude: 2.5 V
Lead Channel Setting Pacing Pulse Width: 0.4 ms
Lead Channel Setting Sensing Sensitivity: 4 mV

## 2013-06-16 ENCOUNTER — Telehealth: Payer: Self-pay | Admitting: Internal Medicine

## 2013-06-16 ENCOUNTER — Encounter: Payer: Self-pay | Admitting: *Deleted

## 2013-06-16 NOTE — Telephone Encounter (Signed)
New message     Having breast duct surgery on 06-26-13----need clearance.   Fax to [phone].  Pt is due to see anasthesia this fri---can they get clearance by Friday?

## 2013-06-18 ENCOUNTER — Encounter: Payer: Self-pay | Admitting: Internal Medicine

## 2013-06-18 NOTE — Telephone Encounter (Signed)
Proceed with surgery if medically indicated.  Routine medical therapy perioperatively.

## 2013-06-18 NOTE — Telephone Encounter (Signed)
Follow Up  2nd call  Pt is having breast duct surgery on 06-26-13----need clearance. Fax to [phone]. Pt is due to see anasthesia this fri---can they get clearance by Friday?

## 2013-08-24 ENCOUNTER — Encounter: Payer: Medicare Other | Admitting: Internal Medicine

## 2013-09-15 ENCOUNTER — Encounter: Payer: Self-pay | Admitting: Internal Medicine

## 2013-09-15 ENCOUNTER — Encounter: Payer: Self-pay | Admitting: Cardiology

## 2013-09-15 ENCOUNTER — Ambulatory Visit (INDEPENDENT_AMBULATORY_CARE_PROVIDER_SITE_OTHER): Payer: Medicare Other | Admitting: Cardiology

## 2013-09-15 VITALS — BP 124/82 | HR 77 | Ht 65.0 in | Wt 157.0 lb

## 2013-09-15 DIAGNOSIS — I4891 Unspecified atrial fibrillation: Secondary | ICD-10-CM

## 2013-09-15 DIAGNOSIS — Z9889 Other specified postprocedural states: Secondary | ICD-10-CM

## 2013-09-15 DIAGNOSIS — Z950 Presence of cardiac pacemaker: Secondary | ICD-10-CM

## 2013-09-15 DIAGNOSIS — D151 Benign neoplasm of heart: Secondary | ICD-10-CM

## 2013-09-15 DIAGNOSIS — I498 Other specified cardiac arrhythmias: Secondary | ICD-10-CM

## 2013-09-15 DIAGNOSIS — I495 Sick sinus syndrome: Secondary | ICD-10-CM

## 2013-09-15 DIAGNOSIS — R001 Bradycardia, unspecified: Secondary | ICD-10-CM

## 2013-09-15 LAB — MDC_IDC_ENUM_SESS_TYPE_INCLINIC
Battery Remaining Longevity: 110 mo
Brady Statistic AP VS Percent: 83 %
Brady Statistic AS VP Percent: 0 %
Lead Channel Impedance Value: 549 Ohm
Lead Channel Pacing Threshold Amplitude: 0.75 V
Lead Channel Pacing Threshold Amplitude: 0.75 V
Lead Channel Pacing Threshold Pulse Width: 0.4 ms
Lead Channel Sensing Intrinsic Amplitude: 11.2 mV
Lead Channel Setting Pacing Amplitude: 2 V
Lead Channel Setting Pacing Pulse Width: 0.4 ms
Lead Channel Setting Sensing Sensitivity: 4 mV
MDC IDC MSMT BATTERY IMPEDANCE: 248 Ohm
MDC IDC MSMT BATTERY VOLTAGE: 2.8 V
MDC IDC MSMT LEADCHNL RA IMPEDANCE VALUE: 459 Ohm
MDC IDC MSMT LEADCHNL RA PACING THRESHOLD PULSEWIDTH: 0.4 ms
MDC IDC MSMT LEADCHNL RA SENSING INTR AMPL: 2.8 mV
MDC IDC SESS DTM: 20150414142055
MDC IDC SET LEADCHNL RV PACING AMPLITUDE: 2.5 V
MDC IDC STAT BRADY AP VP PERCENT: 0 %
MDC IDC STAT BRADY AS VS PERCENT: 16 %

## 2013-09-15 NOTE — Patient Instructions (Addendum)
Your physician wants you to follow-up in: 1 year with Dr. Vallery Ridge will receive a reminder letter in the mail two months in advance. If you don't receive a letter, please call our office to schedule the follow-up appointment.  Remote monitoring is used to monitor your Pacemaker of ICD from home. This monitoring reduces the number of office visits required to check your device to one time per year. It allows [place] to keep an eye on the functioning of your device to ensure it is working properly. You are scheduled for a device check from home on 12/16/13. You may send your transmission at any time that day. If you have a wireless device, the transmission will be sent automatically. After your physician reviews your transmission, you will receive a postcard with your next transmission date.  Your physician recommends that you continue on your current medications as directed. Please refer to the Current Medication list given to you today.

## 2013-09-15 NOTE — Progress Notes (Signed)
ELECTROPHYSIOLOGY OFFICE NOTE   Patient ID: Cassandra Romero MRN: [id number], DOB/AGE: August 03, 1932   Date of Visit: 09/15/2013  Primary Physician: Bertram Millard, MD Primary Cardiologist: Thompson Grayer, MD Reason for Visit: EP/device follow-up  History of Present Illness  Cassandra Romero is a 80 y.o. female with sinus node dysfunction s/p PPM implant and atrial myxoma s/p resection 2010 who presents today for routine electrophysiology followup. Since last being seen in our clinic, she reports she is doing well and has no complaints. She denies chest pain or shortness of breath. She denies palpitations, dizziness, near syncope or syncope. She denies LE swelling, orthopnea or PND. She is compliant with medications.  Past Medical History Past Medical History  Diagnosis Date  . Atrial myxoma     s/p resection  . Tachy-brady syndrome     s/p PPM  . GERD (gastroesophageal reflux disease)   . Osteopenia   . Paroxysmal atrial fibrillation     Past Surgical History Past Surgical History  Procedure Laterality Date  . Cholecystectomy    . Atrial myxoma excision  12/24/08    Left -- Dr Roxy Manns  . Pacemaker insertion    . Appendectomy    . Partial hysterectomy      Allergies/Intolerances Allergies  Allergen Reactions  . Alendronate Sodium     REACTION: extreme fatigue/vision change  . Amiodarone Hcl     REACTION: nausea/vomiting  . Aspirin   . Ibandronate Sodium     REACTION: extreme fatigue/vision changes  . Metaxalone     REACTION: rash  . Risedronate Sodium     REACTION: extreme fatigue/vision    Current Home Medications Current Outpatient Prescriptions  Medication Sig Dispense Refill  . acetaminophen (TYLENOL) 325 MG tablet Take 650 mg by mouth every 6 (six) hours as needed.        Marland Kitchen amoxicillin (AMOXIL) 500 MG capsule Take 2,000 mg by mouth once. 4 tablets before dental procedures       . Calcium Carbonate-Vitamin D (CALCIUM 600+D) 600-400 MG-UNIT per tablet Take 1 tablet by mouth  2 (two) times daily.        . carvedilol (COREG) 6.25 MG tablet Take 6.25 mg by mouth. Take half a tablet twice daily      . Multiple Vitamin (MULTIVITAMIN) capsule Take 1 capsule by mouth daily.      Marland Kitchen omeprazole (PRILOSEC) 20 MG capsule Take 20 mg by mouth daily.        . [DISCONTINUED] warfarin (COUMADIN) 2.5 MG tablet Take 3 mg by mouth as directed.        No current facility-administered medications for this visit.    Social History History   Social History  . Marital Status: Married    Spouse Name: N/A    Number of Children: N/A  . Years of Education: N/A   Occupational History  . retired Pharmacist, hospital    Social History Main Topics  . Smoking status: Former Smoker    Quit date: 06/04/1988  . Smokeless tobacco: Not on file  . Alcohol Use: Yes     Comment: maybe 1 a month  . Drug Use: No  . Sexual Activity: Not on file   Other Topics Concern  . Not on file   Social History Narrative   Lives in [place]     Review of Systems General: No chills, fever, night sweats or weight changes Cardiovascular: No chest pain, dyspnea on exertion, edema, orthopnea, palpitations, paroxysmal nocturnal dyspnea Dermatological: No rash, lesions or  masses Respiratory: No cough, dyspnea Urologic: No hematuria, dysuria Abdominal: No nausea, vomiting, diarrhea, bright red blood per rectum, melena, or hematemesis Neurologic: No visual changes, weakness, changes in mental status All other systems reviewed and are otherwise negative except as noted above.  Physical Exam Vitals: Blood pressure 124/82, pulse 77, height 5\' 5"  (1.651 m), weight 157 lb (71.215 kg).  General: Well developed, well appearing 80 y.o. female in no acute distress. HEENT: Normocephalic, atraumatic. EOMs intact. Sclera nonicteric. Oropharynx clear.  Neck: Supple. No JVD. Lungs: Respirations regular and unlabored, CTA bilaterally. No wheezes, rales or rhonchi. Heart: RRR. S1, S2 present. No murmurs, rub, S3 or S4. Abdomen:  Soft, non-distended.  Extremities: No clubbing, cyanosis or edema. PT/Radials 2+ and equal bilaterally. Psych: Normal affect. Neuro: Alert and oriented X 3. Moves all extremities spontaneously.   Diagnostics Echocardiogram March 2014 Left ventricle: The cavity size was normal. Wall thickness was normal. Systolic function was normal. The estimated ejection fraction was in the range of 55% to 65%. Wall motion was normal; there were no regional wall motion abnormalities. Features are consistent with a pseudonormal left ventricular filling pattern, with concomitant abnormal relaxation and increased filling pressure (grade 2 diastolic dysfunction). ------------------------------------------------------------ Aortic valve: Structurally normal valve. Trileaflet. Cusp separation was normal. Doppler: Transvalvular velocity was within the normal range. There was no stenosis. No regurgitation. ------------------------------------------------------------ Aorta: The aorta was normal, not dilated, and non-diseased. ------------------------------------------------------------ Mitral valve: Structurally normal valve. Leaflet separation was normal. Doppler: Transvalvular velocity was within the normal range. There was no evidence for stenosis. Trivial regurgitation. Peak gradient: 55mm Hg (D). ------------------------------------------------------------ Left atrium: The atrium was normal in size. ------------------------------------------------------------ Right atrium: The atrium was normal in size. Pacer wire or catheter noted in right atrium. ------------------------------------------------------------ Atrial septum: No defect or patent foramen ovale was identified. ------------------------------------------------------------ Right ventricle: The cavity size was normal. Wall thickness was normal. Pacer wire or catheter noted in right ventricle. Systolic function was  normal. ------------------------------------------------------------ Pulmonic valve: Structurally normal valve. Cusp separation was normal. Doppler: Transvalvular velocity was within the normal range. No significant regurgitation. ------------------------------------------------------------ Tricuspid valve: Structurally normal valve. Leaflet separation was normal. Doppler: Transvalvular velocity was within the normal range. Mild regurgitation. ------------------------------------------------------------ Pulmonary artery: The main pulmonary artery was normal-sized ------------------------------------------------------------ Pericardium: The pericardium was normal in appearance. ------------------------------------------------------------ Systemic veins: Inferior vena cava: The vessel was normal in size; the respirophasic diameter changes were in the normal range (= 50%); findings are consistent with normal central venous pressure.  12-lead ECG today - A paced V sensed at 77 bpm; PR 100, QRS 92, QTc 450 Device interrogation today - Normal device function. Thresholds, sensing, impedances consistent with previous measurements. Device programmed to maximize longevity. 40 mode switch episodes, all <1 minute, no EGMs, 0 AHR episodes. No high ventricular rates noted. Device programmed at appropriate safety margins. Histogram distribution appropriate for patient activity level. Device programmed to optimize intrinsic conduction. Estimated longevity 9 years.  Assessment and Plan  1. Sinus node dysfunction s/p PPM implant Normal pacemaker function  No programming changes made  Continue routine remote PPM follow-up every 3 months Return for follow-up with Dr. Rayann Heman in one year  2. Paroxysmal atrial fibrillation No episodes in last 12 months  Anticoagulation stopped due to bleeding   3. Atrial myxoma  Doing well s/p resection 2010 Echo March 2014 showed normal atria and intact interatrial  septum  Signed, Andrez Grime, PA-C 09/15/2013, 12:18 PM

## 2013-12-15 ENCOUNTER — Telehealth: Payer: Self-pay | Admitting: Cardiology

## 2013-12-15 ENCOUNTER — Ambulatory Visit (INDEPENDENT_AMBULATORY_CARE_PROVIDER_SITE_OTHER): Payer: Medicare Other | Admitting: *Deleted

## 2013-12-15 DIAGNOSIS — I495 Sick sinus syndrome: Secondary | ICD-10-CM

## 2013-12-15 NOTE — Progress Notes (Signed)
Remote pacemaker transmission.   

## 2013-12-15 NOTE — Telephone Encounter (Signed)
Spoke with pt and reminded pt of remote transmission that is due today. Pt verbalized understanding.   

## 2013-12-30 ENCOUNTER — Encounter: Payer: Self-pay | Admitting: Cardiology

## 2013-12-31 ENCOUNTER — Encounter: Payer: Self-pay | Admitting: Internal Medicine

## 2014-03-17 ENCOUNTER — Telehealth: Payer: Self-pay | Admitting: Cardiology

## 2014-03-17 ENCOUNTER — Ambulatory Visit (INDEPENDENT_AMBULATORY_CARE_PROVIDER_SITE_OTHER): Payer: Medicare Other | Admitting: *Deleted

## 2014-03-17 DIAGNOSIS — I495 Sick sinus syndrome: Secondary | ICD-10-CM

## 2014-03-17 LAB — MDC_IDC_ENUM_SESS_TYPE_REMOTE
Battery Impedance: 296 Ohm
Battery Voltage: 2.8 V
Brady Statistic AP VS Percent: 87 %
Brady Statistic AS VP Percent: 0 %
Brady Statistic AS VS Percent: 13 %
Date Time Interrogation Session: 20151014180036
Lead Channel Pacing Threshold Amplitude: 0.625 V
Lead Channel Pacing Threshold Amplitude: 0.875 V
Lead Channel Pacing Threshold Pulse Width: 0.4 ms
Lead Channel Pacing Threshold Pulse Width: 0.4 ms
Lead Channel Setting Pacing Amplitude: 2.5 V
Lead Channel Setting Pacing Pulse Width: 0.4 ms
MDC IDC MSMT BATTERY REMAINING LONGEVITY: 105 mo
MDC IDC MSMT LEADCHNL RA IMPEDANCE VALUE: 472 Ohm
MDC IDC MSMT LEADCHNL RV IMPEDANCE VALUE: 672 Ohm
MDC IDC MSMT LEADCHNL RV SENSING INTR AMPL: 22.4 mV
MDC IDC SET LEADCHNL RA PACING AMPLITUDE: 2 V
MDC IDC SET LEADCHNL RV SENSING SENSITIVITY: 4 mV
MDC IDC STAT BRADY AP VP PERCENT: 0 %

## 2014-03-17 NOTE — Telephone Encounter (Signed)
LMOVM reminding pt to send remote transmission.   

## 2014-03-17 NOTE — Progress Notes (Signed)
Remote pacemaker transmission.   

## 2014-04-01 ENCOUNTER — Encounter: Payer: Self-pay | Admitting: Cardiology

## 2014-04-08 ENCOUNTER — Encounter: Payer: Self-pay | Admitting: Internal Medicine

## 2014-06-21 ENCOUNTER — Ambulatory Visit (INDEPENDENT_AMBULATORY_CARE_PROVIDER_SITE_OTHER): Payer: Medicare Other | Admitting: *Deleted

## 2014-06-21 ENCOUNTER — Encounter: Payer: Self-pay | Admitting: Internal Medicine

## 2014-06-21 DIAGNOSIS — I495 Sick sinus syndrome: Secondary | ICD-10-CM

## 2014-06-21 LAB — MDC_IDC_ENUM_SESS_TYPE_REMOTE
Lead Channel Impedance Value: 453 Ohm
Lead Channel Pacing Threshold Amplitude: 0.875 V
Lead Channel Pacing Threshold Pulse Width: 0.4 ms
Lead Channel Sensing Intrinsic Amplitude: 8 mV
Lead Channel Setting Pacing Pulse Width: 0.4 ms
Lead Channel Setting Sensing Sensitivity: 4 mV
MDC IDC MSMT BATTERY IMPEDANCE: 343 Ohm
MDC IDC MSMT BATTERY REMAINING LONGEVITY: 99 mo
MDC IDC MSMT BATTERY VOLTAGE: 2.79 V
MDC IDC MSMT LEADCHNL RA PACING THRESHOLD AMPLITUDE: 0.625 V
MDC IDC MSMT LEADCHNL RA PACING THRESHOLD PULSEWIDTH: 0.4 ms
MDC IDC MSMT LEADCHNL RV IMPEDANCE VALUE: 597 Ohm
MDC IDC SESS DTM: 20160118132853
MDC IDC SET LEADCHNL RA PACING AMPLITUDE: 2 V
MDC IDC SET LEADCHNL RV PACING AMPLITUDE: 2.5 V
MDC IDC STAT BRADY AP VP PERCENT: 0 %
MDC IDC STAT BRADY AP VS PERCENT: 86 %
MDC IDC STAT BRADY AS VP PERCENT: 0 %
MDC IDC STAT BRADY AS VS PERCENT: 14 %

## 2014-06-21 NOTE — Progress Notes (Signed)
Remote pacemaker transmission.   

## 2014-06-29 ENCOUNTER — Encounter: Payer: Self-pay | Admitting: Cardiology

## 2014-08-25 ENCOUNTER — Ambulatory Visit (INDEPENDENT_AMBULATORY_CARE_PROVIDER_SITE_OTHER): Payer: Medicare Other | Admitting: Internal Medicine

## 2014-08-25 ENCOUNTER — Encounter: Payer: Self-pay | Admitting: Internal Medicine

## 2014-08-25 VITALS — BP 126/84 | HR 72 | Ht 65.0 in | Wt 161.0 lb

## 2014-08-25 DIAGNOSIS — I4891 Unspecified atrial fibrillation: Secondary | ICD-10-CM | POA: Diagnosis not present

## 2014-08-25 DIAGNOSIS — I10 Essential (primary) hypertension: Secondary | ICD-10-CM

## 2014-08-25 DIAGNOSIS — I495 Sick sinus syndrome: Secondary | ICD-10-CM | POA: Diagnosis not present

## 2014-08-25 LAB — MDC_IDC_ENUM_SESS_TYPE_INCLINIC
Battery Impedance: 343 Ohm
Battery Voltage: 2.8 V
Brady Statistic AS VP Percent: 0 %
Date Time Interrogation Session: 20160323141914
Lead Channel Impedance Value: 447 Ohm
Lead Channel Pacing Threshold Amplitude: 0.75 V
Lead Channel Pacing Threshold Amplitude: 0.75 V
Lead Channel Pacing Threshold Pulse Width: 0.4 ms
Lead Channel Pacing Threshold Pulse Width: 0.4 ms
Lead Channel Sensing Intrinsic Amplitude: 4 mV
Lead Channel Setting Pacing Amplitude: 2 V
Lead Channel Setting Pacing Amplitude: 2.5 V
Lead Channel Setting Pacing Pulse Width: 0.4 ms
Lead Channel Setting Sensing Sensitivity: 4 mV
MDC IDC MSMT BATTERY REMAINING LONGEVITY: 99 mo
MDC IDC MSMT LEADCHNL RV IMPEDANCE VALUE: 617 Ohm
MDC IDC MSMT LEADCHNL RV SENSING INTR AMPL: 11.2 mV
MDC IDC STAT BRADY AP VP PERCENT: 0 %
MDC IDC STAT BRADY AP VS PERCENT: 87 %
MDC IDC STAT BRADY AS VS PERCENT: 13 %

## 2014-08-25 NOTE — Progress Notes (Signed)
Electrophysiology Office Note   Date:  08/25/2014   ID:  Cassandra Romero, DOB [birth year], MRN [id number]  PCP:  Mateo Flow, MD   Primary Electrophysiologist: Thompson Grayer, MD    Chief Complaint  Patient presents with  . Follow-up    AFIB & SA node dysfunction     History of Present Illness: Cassandra Romero is a 81 y.o. female who presents today for electrophysiology evaluation.   She is doing very well.  Remains active without any symptoms today. Today, she denies symptoms of palpitations, chest pain, shortness of breath, orthopnea, PND, lower extremity edema, claudication, dizziness, presyncope, syncope, bleeding, or neurologic sequela. The patient is tolerating medications without difficulties and is otherwise without complaint today.    Past Medical History  Diagnosis Date  . Atrial myxoma     s/p resection  . Tachy-brady syndrome     s/p PPM  . GERD (gastroesophageal reflux disease)   . Osteopenia   . Paroxysmal atrial fibrillation    Past Surgical History  Procedure Laterality Date  . Cholecystectomy    . Atrial myxoma excision  12/24/08    Left -- Dr Roxy Manns  . Pacemaker insertion    . Appendectomy    . Partial hysterectomy       Current Outpatient Prescriptions  Medication Sig Dispense Refill  . acetaminophen (TYLENOL) 325 MG tablet Take 650 mg by mouth every 6 (six) hours as needed (pain).     Marland Kitchen amoxicillin (AMOXIL) 500 MG capsule Take 2,000 mg by mouth once. 4 tablets before dental procedures     . Calcium Carbonate-Vitamin D (CALCIUM 600+D) 600-400 MG-UNIT per tablet Take 1 tablet by mouth 2 (two) times daily.      . famotidine (PEPCID) 20 MG tablet Take 1 tablet by mouth 2 (two) times daily as needed. Heartburn or acid reflux  0  . Multiple Vitamin (MULTIVITAMIN) capsule Take 1 capsule by mouth daily.    . [DISCONTINUED] warfarin (COUMADIN) 2.5 MG tablet Take 3 mg by mouth as directed.      No current facility-administered medications for this visit.     Allergies:   Aspirin; Alendronate sodium; Amiodarone hcl; Ibandronate sodium; Metaxalone; and Risedronate sodium   Social History:  The patient  reports that she quit smoking about 26 years ago. She does not have any smokeless tobacco history on file. She reports that she drinks alcohol. She reports that she does not use illicit drugs.   Family History:  The patient's family history includes Coronary artery disease in an other family member; Dementia in her mother and another family member; Heart attack (age of onset: 4) in her father; Heart disease in her father; Pancreatic cancer in her mother and another family member; Thyroid disease in her mother; Transient ischemic attack in her mother.    ROS:  Please see the history of present illness.   All other systems are reviewed and negative.    PHYSICAL EXAM: VS:  BP 126/84 mmHg  Pulse 72  Ht 5\' 5"  (1.651 m)  Wt 161 lb (73.029 kg)  BMI 26.79 kg/m2 , BMI Body mass index is 26.79 kg/(m^2). GEN: Well nourished, well developed, in no acute distress HEENT: normal Neck: no JVD, carotid bruits, or masses Cardiac: RRR; no murmurs, rubs, or gallops,no edema  Respiratory:  clear to auscultation bilaterally, normal work of breathing GI: soft, nontender, nondistended, + BS MS: no deformity or atrophy Skin: warm and dry, device pocket is well healed Neuro:  Strength and  sensation are intact Psych: euthymic mood, full affect  Device interrogation is reviewed today in detail.  See PaceArt for details.   Recent Labs: No results found for requested labs within last 365 days.    Lipid Panel     Component Value Date/Time   CHOL  12/22/2008 0150    146        ATP III CLASSIFICATION:  <200     mg/dL   Desirable  200-239  mg/dL   Borderline High  >=240    mg/dL   High          TRIG 83 12/22/2008 0150   HDL 36* 12/22/2008 0150   CHOLHDL 4.1 12/22/2008 0150   VLDL 17 12/22/2008 0150   LDLCALC  12/22/2008 0150    93        Total  Cholesterol/HDL:CHD Risk Coronary Heart Disease Risk Table                     Men   Women  1/2 Average Risk   3.4   3.3  Average Risk       5.0   4.4  2 X Average Risk   9.6   7.1  3 X Average Risk  23.4   11.0        Use the calculated Patient Ratio above and the CHD Risk Table to determine the patient's CHD Risk.        ATP III CLASSIFICATION (LDL):  <100     mg/dL   Optimal  100-129  mg/dL   Near or Above                    Optimal  130-159  mg/dL   Borderline  160-189  mg/dL   High  >190     mg/dL   Very High     Wt Readings from Last 3 Encounters:  08/25/14 161 lb (73.029 kg)  09/15/13 157 lb (71.215 kg)  08/15/12 164 lb 1.9 oz (74.444 kg)     ASSESSMENT AND PLAN:  1.  Sick sinus syndrome Normal pacemaker function See Pace Art report No changes today  2. Atrial myxoma S/p resection Repeat echo to folllow-up  3. Atrial fibrillation afib burden is 0.1% Continue to monitor Consider anticoagulation if afib burden increases. V rates are controlled Stop coreg   Current medicines are reviewed at length with the patient today.   The patient does not have concerns regarding her medicines.  The following changes were made today:  none  Labs/ tests ordered today include:  Orders Placed This Encounter  Procedures  . 2D Echocardiogram without contrast    Follow-up:  [facility] Return to see me in 1year  Signed, Thompson Grayer, MD  08/25/2014 12:54 PM     [address] [place] [address] [phone] (office) [phone] (fax)

## 2014-08-25 NOTE — Patient Instructions (Signed)
Your physician wants you to follow-up in: 12 months with Dr Vallery Ridge will receive a reminder letter in the mail two months in advance. If you don't receive a letter, please call our office to schedule the follow-up appointment.  Remote monitoring is used to monitor your Pacemaker or ICD from home. This monitoring reduces the number of office visits required to check your device to one time per year. It allows [place] to keep an eye on the functioning of your device to ensure it is working properly. You are scheduled for a device check from home on 11/24/14. You may send your transmission at any time that day. If you have a wireless device, the transmission will be sent automatically. After your physician reviews your transmission, you will receive a postcard with your next transmission date.  Your physician has requested that you have an echocardiogram. Echocardiography is a painless test that uses sound waves to create images of your heart. It provides your doctor with information about the size and shape of your heart and how well your heart's chambers and valves are working. This procedure takes approximately one hour. There are no restrictions for this procedure.  Your physician has recommended you make the following change in your medication:  1) Stop Carvedilol

## 2014-08-31 ENCOUNTER — Ambulatory Visit (HOSPITAL_COMMUNITY): Payer: Medicare Other | Attending: Cardiology | Admitting: Radiology

## 2014-08-31 DIAGNOSIS — I4891 Unspecified atrial fibrillation: Secondary | ICD-10-CM | POA: Insufficient documentation

## 2014-08-31 DIAGNOSIS — Z87891 Personal history of nicotine dependence: Secondary | ICD-10-CM | POA: Insufficient documentation

## 2014-08-31 DIAGNOSIS — I10 Essential (primary) hypertension: Secondary | ICD-10-CM | POA: Diagnosis not present

## 2014-08-31 NOTE — Progress Notes (Signed)
Echocardiogram performed.  

## 2014-11-24 ENCOUNTER — Telehealth: Payer: Self-pay | Admitting: Cardiology

## 2014-11-24 ENCOUNTER — Ambulatory Visit (INDEPENDENT_AMBULATORY_CARE_PROVIDER_SITE_OTHER): Payer: Medicare Other | Admitting: *Deleted

## 2014-11-24 DIAGNOSIS — I495 Sick sinus syndrome: Secondary | ICD-10-CM

## 2014-11-24 NOTE — Telephone Encounter (Signed)
Spoke with pt and reminded pt of remote transmission that is due today. Pt verbalized understanding.   

## 2014-11-25 NOTE — Progress Notes (Signed)
Remote pacemaker transmission.   

## 2014-11-28 LAB — CUP PACEART REMOTE DEVICE CHECK
Battery Impedance: 367 Ohm
Battery Voltage: 2.79 V
Brady Statistic AP VP Percent: 0 %
Brady Statistic AP VS Percent: 60 %
Brady Statistic AS VP Percent: 0 %
Brady Statistic AS VS Percent: 40 %
Lead Channel Pacing Threshold Amplitude: 0.625 V
Lead Channel Pacing Threshold Pulse Width: 0.4 ms
Lead Channel Sensing Intrinsic Amplitude: 2.8 mV
Lead Channel Setting Pacing Amplitude: 2 V
Lead Channel Setting Pacing Amplitude: 2.5 V
MDC IDC MSMT BATTERY REMAINING LONGEVITY: 101 mo
MDC IDC MSMT LEADCHNL RA IMPEDANCE VALUE: 472 Ohm
MDC IDC MSMT LEADCHNL RV IMPEDANCE VALUE: 637 Ohm
MDC IDC MSMT LEADCHNL RV PACING THRESHOLD AMPLITUDE: 0.75 V
MDC IDC MSMT LEADCHNL RV PACING THRESHOLD PULSEWIDTH: 0.4 ms
MDC IDC MSMT LEADCHNL RV SENSING INTR AMPL: 8 mV
MDC IDC SESS DTM: 20160622160111
MDC IDC SET LEADCHNL RV PACING PULSEWIDTH: 0.4 ms
MDC IDC SET LEADCHNL RV SENSING SENSITIVITY: 4 mV

## 2014-12-01 ENCOUNTER — Encounter: Payer: Self-pay | Admitting: Cardiology

## 2014-12-13 ENCOUNTER — Encounter: Payer: Self-pay | Admitting: Internal Medicine

## 2015-02-23 ENCOUNTER — Ambulatory Visit: Payer: Medicare Other | Admitting: *Deleted

## 2015-02-23 ENCOUNTER — Telehealth: Payer: Self-pay | Admitting: Cardiology

## 2015-02-23 DIAGNOSIS — I495 Sick sinus syndrome: Secondary | ICD-10-CM

## 2015-02-23 NOTE — Progress Notes (Signed)
Remote pacemaker transmission.   

## 2015-02-23 NOTE — Telephone Encounter (Signed)
Spoke with pt and reminded pt of remote transmission that is due today. Pt verbalized understanding.   

## 2015-02-26 LAB — CUP PACEART REMOTE DEVICE CHECK
Battery Impedance: 415 Ohm
Battery Voltage: 2.79 V
Brady Statistic AP VP Percent: 0 %
Brady Statistic AP VS Percent: 65 %
Brady Statistic AS VS Percent: 35 %
Lead Channel Impedance Value: 460 Ohm
Lead Channel Impedance Value: 593 Ohm
Lead Channel Pacing Threshold Amplitude: 0.625 V
Lead Channel Pacing Threshold Pulse Width: 0.4 ms
Lead Channel Sensing Intrinsic Amplitude: 2.8 mV
Lead Channel Sensing Intrinsic Amplitude: 8 mV
Lead Channel Setting Pacing Amplitude: 2 V
Lead Channel Setting Pacing Pulse Width: 0.4 ms
Lead Channel Setting Sensing Sensitivity: 4 mV
MDC IDC MSMT BATTERY REMAINING LONGEVITY: 96 mo
MDC IDC MSMT LEADCHNL RA PACING THRESHOLD PULSEWIDTH: 0.4 ms
MDC IDC MSMT LEADCHNL RV PACING THRESHOLD AMPLITUDE: 0.75 V
MDC IDC SESS DTM: 20160921155407
MDC IDC SET LEADCHNL RV PACING AMPLITUDE: 2.5 V
MDC IDC STAT BRADY AS VP PERCENT: 0 %

## 2015-03-04 ENCOUNTER — Encounter: Payer: Self-pay | Admitting: Cardiology

## 2015-03-16 ENCOUNTER — Encounter: Payer: Self-pay | Admitting: Internal Medicine

## 2015-05-26 ENCOUNTER — Telehealth: Payer: Self-pay | Admitting: Cardiology

## 2015-05-26 ENCOUNTER — Ambulatory Visit (INDEPENDENT_AMBULATORY_CARE_PROVIDER_SITE_OTHER): Payer: Medicare Other | Admitting: *Deleted

## 2015-05-26 DIAGNOSIS — I495 Sick sinus syndrome: Secondary | ICD-10-CM | POA: Diagnosis not present

## 2015-05-26 NOTE — Progress Notes (Signed)
Remote pacemaker transmission.   

## 2015-05-26 NOTE — Telephone Encounter (Signed)
Spoke with pt and reminded pt of remote transmission that is due today. Pt verbalized understanding.   

## 2015-05-31 LAB — CUP PACEART REMOTE DEVICE CHECK
Battery Impedance: 439 Ohm
Battery Remaining Longevity: 94 mo
Brady Statistic AP VP Percent: 0 %
Brady Statistic AP VS Percent: 64 %
Brady Statistic AS VS Percent: 36 %
Date Time Interrogation Session: 20161222144636
Implantable Lead Implant Date: 20100729
Implantable Lead Location: 753859
Implantable Lead Location: 753860
Implantable Lead Model: 5076
Lead Channel Impedance Value: 580 Ohm
Lead Channel Pacing Threshold Amplitude: 0.625 V
Lead Channel Pacing Threshold Amplitude: 0.875 V
Lead Channel Sensing Intrinsic Amplitude: 11.2 mV
Lead Channel Sensing Intrinsic Amplitude: 2.8 mV
Lead Channel Setting Pacing Pulse Width: 0.4 ms
Lead Channel Setting Sensing Sensitivity: 4 mV
MDC IDC LEAD IMPLANT DT: 20100729
MDC IDC MSMT BATTERY VOLTAGE: 2.79 V
MDC IDC MSMT LEADCHNL RA IMPEDANCE VALUE: 453 Ohm
MDC IDC MSMT LEADCHNL RA PACING THRESHOLD PULSEWIDTH: 0.4 ms
MDC IDC MSMT LEADCHNL RV PACING THRESHOLD PULSEWIDTH: 0.4 ms
MDC IDC SET LEADCHNL RA PACING AMPLITUDE: 2 V
MDC IDC SET LEADCHNL RV PACING AMPLITUDE: 2.5 V
MDC IDC STAT BRADY AS VP PERCENT: 0 %

## 2015-06-02 ENCOUNTER — Encounter: Payer: Self-pay | Admitting: Cardiology

## 2015-08-23 ENCOUNTER — Other Ambulatory Visit: Payer: Self-pay

## 2015-08-29 ENCOUNTER — Encounter: Payer: Self-pay | Admitting: Internal Medicine

## 2015-08-29 ENCOUNTER — Ambulatory Visit (INDEPENDENT_AMBULATORY_CARE_PROVIDER_SITE_OTHER): Payer: Medicare Other | Admitting: Internal Medicine

## 2015-08-29 VITALS — BP 138/78 | HR 75 | Ht 65.0 in | Wt 156.4 lb

## 2015-08-29 DIAGNOSIS — I10 Essential (primary) hypertension: Secondary | ICD-10-CM | POA: Diagnosis not present

## 2015-08-29 DIAGNOSIS — I480 Paroxysmal atrial fibrillation: Secondary | ICD-10-CM

## 2015-08-29 DIAGNOSIS — I495 Sick sinus syndrome: Secondary | ICD-10-CM | POA: Diagnosis not present

## 2015-08-29 LAB — CUP PACEART INCLINIC DEVICE CHECK
Battery Voltage: 2.79 V
Brady Statistic AP VP Percent: 0 %
Brady Statistic AP VS Percent: 62 %
Brady Statistic AS VP Percent: 0 %
Implantable Lead Implant Date: 20100729
Implantable Lead Model: 5076
Lead Channel Impedance Value: 595 Ohm
Lead Channel Pacing Threshold Amplitude: 0.75 V
Lead Channel Pacing Threshold Pulse Width: 0.4 ms
Lead Channel Pacing Threshold Pulse Width: 0.4 ms
Lead Channel Setting Pacing Amplitude: 2 V
Lead Channel Setting Sensing Sensitivity: 4 mV
MDC IDC LEAD IMPLANT DT: 20100729
MDC IDC LEAD LOCATION: 753859
MDC IDC LEAD LOCATION: 753860
MDC IDC MSMT BATTERY IMPEDANCE: 463 Ohm
MDC IDC MSMT BATTERY REMAINING LONGEVITY: 92 mo
MDC IDC MSMT LEADCHNL RA IMPEDANCE VALUE: 467 Ohm
MDC IDC MSMT LEADCHNL RA PACING THRESHOLD AMPLITUDE: 0.5 V
MDC IDC MSMT LEADCHNL RA SENSING INTR AMPL: 4 mV
MDC IDC MSMT LEADCHNL RV SENSING INTR AMPL: 11.2 mV
MDC IDC SESS DTM: 20170327143954
MDC IDC SET LEADCHNL RV PACING AMPLITUDE: 2.5 V
MDC IDC SET LEADCHNL RV PACING PULSEWIDTH: 0.4 ms
MDC IDC STAT BRADY AS VS PERCENT: 38 %

## 2015-08-29 NOTE — Progress Notes (Signed)
Electrophysiology Office Note   Date:  08/29/2015   ID:  Cassandra Romero, DOB 1933-01-27, MRN [id number]  PCP:  Mateo Flow, MD   Primary Electrophysiologist: Thompson Grayer, MD    Chief Complaint  Patient presents with  . Follow-up     History of Present Illness: Cassandra Romero is a 82 y.o. female who presents today for electrophysiology evaluation.   She is doing very well.  Remains active without any symptoms today. Today, she denies symptoms of palpitations, chest pain, shortness of breath, orthopnea, PND, lower extremity edema, claudication, dizziness, presyncope, syncope, bleeding, or neurologic sequela. The patient is tolerating medications without difficulties and is otherwise without complaint today.    Past Medical History  Diagnosis Date  . Atrial myxoma     s/p resection  . Tachy-brady syndrome (HCC)     s/p PPM  . GERD (gastroesophageal reflux disease)   . Osteopenia   . Paroxysmal atrial fibrillation [facility])    Past Surgical History  Procedure Laterality Date  . Cholecystectomy    . Atrial myxoma excision  12/24/08    Left -- Dr Roxy Manns  . Pacemaker insertion    . Appendectomy    . Partial hysterectomy       Current Outpatient Prescriptions  Medication Sig Dispense Refill  . acetaminophen (TYLENOL) 325 MG tablet Take 650 mg by mouth every 6 (six) hours as needed (pain).     Marland Kitchen amoxicillin (AMOXIL) 500 MG capsule Take 2,000 mg by mouth once. 4 tablets before dental procedures     . Calcium Carbonate-Vitamin D (CALCIUM 600+D) 600-400 MG-UNIT per tablet Take 1 tablet by mouth 2 (two) times daily.      . famotidine (PEPCID) 20 MG tablet Take 1 tablet by mouth 2 (two) times daily. Heartburn or acid reflux  0  . Multiple Vitamin (MULTIVITAMIN) capsule Take 1 capsule by mouth daily.    . [DISCONTINUED] warfarin (COUMADIN) 2.5 MG tablet Take 3 mg by mouth as directed.      No current facility-administered medications for this visit.    Allergies:   Aspirin;  Alendronate sodium; Amiodarone hcl; Ibandronate sodium; Metaxalone; and Risedronate sodium   Social History:  The patient  reports that she quit smoking about 27 years ago. She does not have any smokeless tobacco history on file. She reports that she drinks alcohol. She reports that she does not use illicit drugs.   Family History:  The patient's family history includes Dementia in her mother; Heart attack (age of onset: 53) in her father; Heart disease in her father; Pancreatic cancer in her mother; Thyroid disease in her mother; Transient ischemic attack in her mother.    ROS:  Please see the history of present illness.   All other systems are reviewed and negative.    PHYSICAL EXAM: VS:  BP 138/78 mmHg  Pulse 75  Ht 5\' 5"  (1.651 m)  Wt 156 lb 6 oz (70.931 kg)  BMI 26.02 kg/m2 , BMI Body mass index is 26.02 kg/(m^2). GEN: Well nourished, well developed, in no acute distress HEENT: normal Neck: no JVD, carotid bruits, or masses Cardiac: RRR; no murmurs, rubs, or gallops,no edema  Respiratory:  clear to auscultation bilaterally, normal work of breathing GI: soft, nontender, nondistended, + BS MS: no deformity or atrophy Skin: warm and dry, device pocket is well healed Neuro:  Strength and sensation are intact Psych: euthymic mood, full affect  Device interrogation is reviewed today in detail.  See PaceArt for details.  Recent Labs: No results found for requested labs within last 365 days.    Lipid Panel     Component Value Date/Time   CHOL  12/22/2008 0150    146        ATP III CLASSIFICATION:  <200     mg/dL   Desirable  200-239  mg/dL   Borderline High  >=240    mg/dL   High          TRIG 83 12/22/2008 0150   HDL 36* 12/22/2008 0150   CHOLHDL 4.1 12/22/2008 0150   VLDL 17 12/22/2008 0150   LDLCALC  12/22/2008 0150    93        Total Cholesterol/HDL:CHD Risk Coronary Heart Disease Risk Table                     Men   Women  1/2 Average Risk   3.4   3.3  Average  Risk       5.0   4.4  2 X Average Risk   9.6   7.1  3 X Average Risk  23.4   11.0        Use the calculated Patient Ratio above and the CHD Risk Table to determine the patient's CHD Risk.        ATP III CLASSIFICATION (LDL):  <100     mg/dL   Optimal  100-129  mg/dL   Near or Above                    Optimal  130-159  mg/dL   Borderline  160-189  mg/dL   High  >190     mg/dL   Very High   ekg today reveals atrila pcing 76 bm, rsr'   Wt Readings from Last 3 Encounters:  08/29/15 156 lb 6 oz (70.931 kg)  08/25/14 161 lb (73.029 kg)  09/15/13 157 lb (71.215 kg)     ASSESSMENT AND PLAN:  1.  Sick sinus syndrome Normal pacemaker function See Pace Art report No changes today  2. Atrial myxoma S/p resection Echo from 2016 reviewed with patient Repeat echo to folllow-up in 1 year  3. Atrial fibrillation afib burden is < 0.1% Continue to monitor Consider anticoagulation if afib burden increases. V rates are controlled   Current medicines are reviewed at length with the patient today.   The patient does not have concerns regarding her medicines.  The following changes were made today:  none   Follow-up:  [facility] Return to see EP NP in 1year  Signed, Thompson Grayer, MD  08/29/2015 12:12 PM     [place] [address] [phone] (office) [phone] (fax)

## 2015-08-29 NOTE — Patient Instructions (Signed)
Medication Instructions:  Your physician recommends that you continue on your current medications as directed. Please refer to the Current Medication list given to you today.   Labwork: None ordered   Testing/Procedures: Your physician has requested that you have an echocardiogram. Echocardiography is a painless test that uses sound waves to create images of your heart. It provides your doctor with information about the size and shape of your heart and how well your heart's chambers and valves are working. This procedure takes approximately one hour. There are no restrictions for this procedure.    Follow-Up: Your physician wants you to follow-up in: 12 months with Chanetta Marshall NP  You will receive a reminder letter in the mail two months in advance. If you don't receive a letter, please call our office to schedule the follow-up appointment.  Remote monitoring is used to monitor your Pacemaker from home. This monitoring reduces the number of office visits required to check your device to one time per year. It allows [place] to keep an eye on the functioning of your device to ensure it is working properly. You are scheduled for a device check from home on 11/28/15. You may send your transmission at any time that day. If you have a wireless device, the transmission will be sent automatically. After your physician reviews your transmission, you will receive a postcard with your next transmission date.     Any Other Special Instructions Will Be Listed Below (If Applicable).     If you need a refill on your cardiac medications before your next appointment, please call your pharmacy.

## 2015-09-19 DIAGNOSIS — N6011 Diffuse cystic mastopathy of right breast: Secondary | ICD-10-CM | POA: Insufficient documentation

## 2015-09-19 DIAGNOSIS — N6452 Nipple discharge: Secondary | ICD-10-CM

## 2015-09-19 DIAGNOSIS — N6012 Diffuse cystic mastopathy of left breast: Secondary | ICD-10-CM | POA: Insufficient documentation

## 2015-09-19 HISTORY — DX: Nipple discharge: N64.52

## 2015-09-19 HISTORY — DX: Diffuse cystic mastopathy of right breast: N60.11

## 2015-11-28 ENCOUNTER — Telehealth: Payer: Self-pay | Admitting: Cardiology

## 2015-11-28 ENCOUNTER — Ambulatory Visit (INDEPENDENT_AMBULATORY_CARE_PROVIDER_SITE_OTHER): Payer: Medicare Other | Admitting: *Deleted

## 2015-11-28 DIAGNOSIS — I495 Sick sinus syndrome: Secondary | ICD-10-CM

## 2015-11-28 NOTE — Progress Notes (Signed)
Remote pacemaker transmission.   

## 2015-11-28 NOTE — Telephone Encounter (Signed)
Confirmed remote transmission w/ pt husband.   

## 2015-11-29 LAB — CUP PACEART REMOTE DEVICE CHECK
Battery Voltage: 2.79 V
Brady Statistic AP VS Percent: 62 %
Brady Statistic AS VP Percent: 0 %
Brady Statistic AS VS Percent: 38 %
Implantable Lead Implant Date: 20100729
Implantable Lead Implant Date: 20100729
Implantable Lead Location: 753859
Implantable Lead Model: 5076
Lead Channel Impedance Value: 453 Ohm
Lead Channel Impedance Value: 588 Ohm
Lead Channel Pacing Threshold Amplitude: 0.625 V
Lead Channel Pacing Threshold Pulse Width: 0.4 ms
Lead Channel Pacing Threshold Pulse Width: 0.4 ms
Lead Channel Setting Pacing Amplitude: 2 V
Lead Channel Setting Pacing Pulse Width: 0.4 ms
Lead Channel Setting Sensing Sensitivity: 4 mV
MDC IDC LEAD LOCATION: 753860
MDC IDC MSMT BATTERY IMPEDANCE: 560 Ohm
MDC IDC MSMT BATTERY REMAINING LONGEVITY: 84 mo
MDC IDC MSMT LEADCHNL RA PACING THRESHOLD AMPLITUDE: 0.625 V
MDC IDC MSMT LEADCHNL RA SENSING INTR AMPL: 2.8 mV
MDC IDC MSMT LEADCHNL RV SENSING INTR AMPL: 8 mV
MDC IDC SESS DTM: 20170626143124
MDC IDC SET LEADCHNL RV PACING AMPLITUDE: 2.5 V
MDC IDC STAT BRADY AP VP PERCENT: 0 %

## 2015-11-30 ENCOUNTER — Encounter: Payer: Self-pay | Admitting: Cardiology

## 2016-02-27 ENCOUNTER — Telehealth: Payer: Self-pay | Admitting: Cardiology

## 2016-02-27 ENCOUNTER — Ambulatory Visit (INDEPENDENT_AMBULATORY_CARE_PROVIDER_SITE_OTHER): Payer: Medicare Other | Admitting: *Deleted

## 2016-02-27 DIAGNOSIS — I495 Sick sinus syndrome: Secondary | ICD-10-CM

## 2016-02-27 NOTE — Progress Notes (Signed)
Remote pacemaker transmission.   

## 2016-02-27 NOTE — Telephone Encounter (Signed)
Spoke with pt and reminded pt of remote transmission that is due today. Pt verbalized understanding.   

## 2016-02-29 ENCOUNTER — Encounter: Payer: Self-pay | Admitting: Cardiology

## 2016-03-27 LAB — CUP PACEART REMOTE DEVICE CHECK
Battery Remaining Longevity: 82 mo
Brady Statistic AP VS Percent: 64 %
Brady Statistic AS VP Percent: 0 %
Implantable Lead Implant Date: 20100729
Lead Channel Impedance Value: 613 Ohm
Lead Channel Pacing Threshold Amplitude: 0.75 V
Lead Channel Pacing Threshold Pulse Width: 0.4 ms
Lead Channel Pacing Threshold Pulse Width: 0.4 ms
Lead Channel Setting Pacing Amplitude: 2 V
Lead Channel Setting Pacing Amplitude: 2.5 V
Lead Channel Setting Pacing Pulse Width: 0.4 ms
Lead Channel Setting Sensing Sensitivity: 4 mV
MDC IDC LEAD IMPLANT DT: 20100729
MDC IDC LEAD LOCATION: 753859
MDC IDC LEAD LOCATION: 753860
MDC IDC MSMT BATTERY IMPEDANCE: 584 Ohm
MDC IDC MSMT BATTERY VOLTAGE: 2.79 V
MDC IDC MSMT LEADCHNL RA IMPEDANCE VALUE: 447 Ohm
MDC IDC MSMT LEADCHNL RA PACING THRESHOLD AMPLITUDE: 0.625 V
MDC IDC MSMT LEADCHNL RA SENSING INTR AMPL: 2.8 mV
MDC IDC MSMT LEADCHNL RV SENSING INTR AMPL: 8 mV
MDC IDC SESS DTM: 20170925162105
MDC IDC STAT BRADY AP VP PERCENT: 1 %
MDC IDC STAT BRADY AS VS PERCENT: 35 %

## 2016-04-16 DIAGNOSIS — S81801S Unspecified open wound, right lower leg, sequela: Secondary | ICD-10-CM | POA: Insufficient documentation

## 2016-04-16 HISTORY — DX: Unspecified open wound, right lower leg, sequela: S81.801S

## 2016-05-31 ENCOUNTER — Ambulatory Visit (INDEPENDENT_AMBULATORY_CARE_PROVIDER_SITE_OTHER): Payer: Medicare Other | Admitting: *Deleted

## 2016-05-31 DIAGNOSIS — I495 Sick sinus syndrome: Secondary | ICD-10-CM | POA: Diagnosis not present

## 2016-05-31 NOTE — Progress Notes (Signed)
Remote pacemaker transmission.   

## 2016-06-01 ENCOUNTER — Encounter: Payer: Self-pay | Admitting: Cardiology

## 2016-06-08 LAB — CUP PACEART REMOTE DEVICE CHECK
Battery Impedance: 610 Ohm
Battery Remaining Longevity: 81 mo
Battery Voltage: 2.79 V
Brady Statistic AP VP Percent: 1 %
Brady Statistic AS VS Percent: 34 %
Implantable Lead Implant Date: 20100729
Implantable Lead Location: 753859
Implantable Lead Model: 5076
Implantable Lead Model: 5076
Lead Channel Impedance Value: 479 Ohm
Lead Channel Impedance Value: 710 Ohm
Lead Channel Pacing Threshold Pulse Width: 0.4 ms
Lead Channel Setting Pacing Amplitude: 2.5 V
Lead Channel Setting Pacing Pulse Width: 0.4 ms
MDC IDC LEAD IMPLANT DT: 20100729
MDC IDC LEAD LOCATION: 753860
MDC IDC MSMT LEADCHNL RA PACING THRESHOLD AMPLITUDE: 0.625 V
MDC IDC MSMT LEADCHNL RV PACING THRESHOLD AMPLITUDE: 0.875 V
MDC IDC MSMT LEADCHNL RV PACING THRESHOLD PULSEWIDTH: 0.4 ms
MDC IDC PG IMPLANT DT: 20100729
MDC IDC SESS DTM: 20171228171326
MDC IDC SET LEADCHNL RA PACING AMPLITUDE: 2 V
MDC IDC SET LEADCHNL RV SENSING SENSITIVITY: 5.6 mV
MDC IDC STAT BRADY AP VS PERCENT: 65 %
MDC IDC STAT BRADY AS VP PERCENT: 0 %

## 2016-08-15 ENCOUNTER — Encounter: Payer: Self-pay | Admitting: Nurse Practitioner

## 2016-08-21 NOTE — Progress Notes (Deleted)
Electrophysiology Office Note Date: 08/21/2016  ID:  Cassandra Romero, DOB [birth year], MRN [id number]  PCP: Mateo Flow, MD Electrophysiologist: Rayann Heman  CC: Pacemaker follow-up  Cassandra Romero is a 83 y.o. female seen today for Dr Rayann Heman.  She presents today for routine electrophysiology followup.  Since last being seen in our clinic, the patient reports doing very well.  She denies chest pain, palpitations, dyspnea, PND, orthopnea, nausea, vomiting, dizziness, syncope, edema, weight gain, or early satiety.  Device History: MDT dual chamber PPM implanted 2010 for sick sinus syndrome   Past Medical History:  Diagnosis Date  . Atrial myxoma    s/p resection  . GERD (gastroesophageal reflux disease)   . Osteopenia   . Paroxysmal atrial fibrillation (HCC)   . Tachy-brady syndrome ([place])    s/p PPM   Past Surgical History:  Procedure Laterality Date  . APPENDECTOMY    . ATRIAL MYXOMA EXCISION  12/24/08   Left -- Dr Roxy Manns  . CHOLECYSTECTOMY    . PACEMAKER INSERTION    . PARTIAL HYSTERECTOMY      Current Outpatient Prescriptions  Medication Sig Dispense Refill  . acetaminophen (TYLENOL) 325 MG tablet Take 650 mg by mouth every 6 (six) hours as needed (pain).     Marland Kitchen amoxicillin (AMOXIL) 500 MG capsule Take 2,000 mg by mouth once. 4 tablets before dental procedures     . Calcium Carbonate-Vitamin D (CALCIUM 600+D) 600-400 MG-UNIT per tablet Take 1 tablet by mouth 2 (two) times daily.      . famotidine (PEPCID) 20 MG tablet Take 1 tablet by mouth 2 (two) times daily. Heartburn or acid reflux  0  . Multiple Vitamin (MULTIVITAMIN) capsule Take 1 capsule by mouth daily.     No current facility-administered medications for this visit.     Allergies:   Aspirin; Alendronate sodium; Amiodarone hcl; Ibandronate sodium; Metaxalone; and Risedronate sodium   Social History: Social History   Social History  . Marital status: Married    Spouse name: N/A  . Number of children: N/A  .  Years of education: N/A   Occupational History  . retired Pharmacist, hospital    Social History Main Topics  . Smoking status: Former Smoker    Quit date: 06/04/1988  . Smokeless tobacco: Never Used  . Alcohol use Yes     Comment: maybe 1 a month  . Drug use: No  . Sexual activity: Not on file   Other Topics Concern  . Not on file   Social History Narrative   Lives in [place]    Family History: Family History  Problem Relation Age of Onset  . Pancreatic cancer Mother   . Transient ischemic attack Mother     numerous  . Dementia Mother   . Thyroid disease Mother   . Heart disease Father   . Heart attack Father 69  . Pancreatic cancer    . Dementia      parathyroid removal  . Coronary artery disease       Review of Systems: All other systems reviewed and are otherwise negative except as noted above.   Physical Exam: VS:  There were no vitals taken for this visit. , BMI There is no height or weight on file to calculate BMI.  GEN- The patient is well appearing, alert and oriented x 3 today.   HEENT: normocephalic, atraumatic; sclera clear, conjunctiva pink; hearing intact; oropharynx clear; neck supple, no JVP Lymph- no cervical lymphadenopathy Lungs- Clear to ausculation bilaterally,  normal work of breathing.  No wheezes, rales, rhonchi Heart- Regular rate and rhythm, no murmurs, rubs or gallops, PMI not laterally displaced GI- soft, non-tender, non-distended, bowel sounds present, no hepatosplenomegaly Extremities- no clubbing, cyanosis, or edema; DP/PT/radial pulses 2+ bilaterally MS- no significant deformity or atrophy Skin- warm and dry, no rash or lesion; PPM pocket well healed Psych- euthymic mood, full affect Neuro- strength and sensation are intact  PPM Interrogation- reviewed in detail today,  See PACEART report  EKG:  EKG is ordered today. The ekg ordered today shows ***  Recent Labs: No results found for requested labs within last 8760 hours.   Wt Readings  from Last 3 Encounters:  08/29/15 156 lb 6 oz (70.9 kg)  08/25/14 161 lb (73 kg)  09/15/13 157 lb (71.2 kg)     Other studies Reviewed: Additional studies/ records that were reviewed today include: Dr Jackalyn Lombard office notes  Assessment and Plan:  1.  Symptomatic bradycardia Normal PPM function See Pace Art report No changes today  2.  Paroxysmal atrial fibrillation Burden by device interrogation ***% V rates controlled Consider OAC if burden increases  3.  Atrial myxoma S/p resection Will update echo   Current medicines are reviewed at length with the patient today.   The patient {ACTIONS; HAS/DOES NOT HAVE:19233} concerns regarding her medicines.  The following changes were made today:  {NONE DEFAULTED:18576::"none"}  Labs/ tests ordered today include: echo No orders of the defined types were placed in this encounter.    Disposition:   Follow up with [facility], Dr Rayann Heman 1 year    Signed, Chanetta Marshall, NP 08/21/2016 2:36 PM  [place] [address] [phone] (office) [phone] (fax

## 2016-08-22 ENCOUNTER — Encounter: Payer: Medicare Other | Admitting: Nurse Practitioner

## 2016-08-23 ENCOUNTER — Encounter: Payer: Medicare Other | Admitting: Nurse Practitioner

## 2016-09-20 NOTE — Progress Notes (Signed)
Electrophysiology Office Note Date: 09/21/2016  ID:  Cassandra Romero, DOB [birth year], MRN [id number]  PCP: Mateo Flow, MD Electrophysiologist: Rayann Heman  CC: Pacemaker follow-up  Cassandra Romero is a 83 y.o. female seen today for Dr Rayann Heman.  She presents today for routine electrophysiology followup.  Since last being seen in our clinic, the patient reports doing very well.  She denies chest pain, palpitations, dyspnea, PND, orthopnea, nausea, vomiting, dizziness, syncope, edema, weight gain, or early satiety.  Device History: MDT dual chamber PPM implanted 2010 for SSS   Past Medical History:  Diagnosis Date  . Atrial myxoma    s/p resection  . GERD (gastroesophageal reflux disease)   . Osteopenia   . Paroxysmal atrial fibrillation (HCC)   . Tachy-brady syndrome ([place])    s/p PPM   Past Surgical History:  Procedure Laterality Date  . APPENDECTOMY    . ATRIAL MYXOMA EXCISION  12/24/08   Left -- Dr Roxy Manns  . CHOLECYSTECTOMY    . PACEMAKER INSERTION    . PARTIAL HYSTERECTOMY      Current Outpatient Prescriptions  Medication Sig Dispense Refill  . acetaminophen (TYLENOL) 325 MG tablet Take 650 mg by mouth every 6 (six) hours as needed (pain).     . famotidine (PEPCID) 20 MG tablet Take 1 tablet by mouth 2 (two) times daily. Heartburn or acid reflux  0  . fexofenadine (ALLEGRA) 30 MG tablet Take 30 mg by mouth daily.     No current facility-administered medications for this visit.     Allergies:   Aspirin; Alendronate sodium; Amiodarone hcl; Ibandronate sodium; Metaxalone; and Risedronate sodium   Social History: Social History   Social History  . Marital status: Married    Spouse name: N/A  . Number of children: N/A  . Years of education: N/A   Occupational History  . retired Pharmacist, hospital    Social History Main Topics  . Smoking status: Former Smoker    Quit date: 06/04/1988  . Smokeless tobacco: Never Used  . Alcohol use Yes     Comment: maybe 1 a month  . Drug use:  No  . Sexual activity: Not on file   Other Topics Concern  . Not on file   Social History Narrative   Lives in [place]    Family History: Family History  Problem Relation Age of Onset  . Pancreatic cancer Mother   . Transient ischemic attack Mother     numerous  . Dementia Mother   . Thyroid disease Mother   . Heart disease Father   . Heart attack Father 107  . Pancreatic cancer    . Dementia      parathyroid removal  . Coronary artery disease       Review of Systems: All other systems reviewed and are otherwise negative except as noted above.   Physical Exam: VS:  BP 120/82   Pulse 70   Ht 5\' 5"  (1.651 m)   Wt 159 lb 14.4 oz (72.5 kg)   BMI 26.61 kg/m  , BMI Body mass index is 26.61 kg/m.  GEN- The patient is elderly appearing, alert and oriented x 3 today.   HEENT: normocephalic, atraumatic; sclera clear, conjunctiva pink; hearing intact; oropharynx clear; neck supple Lungs- Clear to ausculation bilaterally, normal work of breathing.  No wheezes, rales, rhonchi Heart- Regular rate and rhythm  GI- soft, non-tender, non-distended, bowel sounds present  Extremities- no clubbing, cyanosis, or edema  MS- no significant deformity or atrophy Skin-  warm and dry, no rash or lesion; PPM pocket well healed Psych- euthymic mood, full affect Neuro- strength and sensation are intact  PPM Interrogation- reviewed in detail today,  See PACEART report  EKG:  EKG is ordered today. The ekg ordered today shows atrial pacing with intrinsic ventricular conduction, normal intervals   Recent Labs: No results found for requested labs within last 8760 hours.   Wt Readings from Last 3 Encounters:  09/21/16 159 lb 14.4 oz (72.5 kg)  08/29/15 156 lb 6 oz (70.9 kg)  08/25/14 161 lb (73 kg)     Other studies Reviewed: Additional studies/ records that were reviewed today include: Dr Jackalyn Lombard office notes  Assessment and Plan:  1.  Symptomatic bradycardia Normal PPM  function See Pace Art report No changes today  2.  Paroxysmal atrial fibrillation Burden by device interrogation 0.4% V rates elevated The majority of episodes are AT.  Only 1 episode of atrial flutter in march of 2017. Will follow remotely. If burden increases, or more true AF or flutter identified, will need to consider [place]  3.  Atrial myxoma S/p resection Update echo    Current medicines are reviewed at length with the patient today.   The patient does not have concerns regarding her medicines.  The following changes were made today:  none  Labs/ tests ordered today include: echo  Orders Placed This Encounter  Procedures  . CUP PACEART [place]  . EKG 12-Lead  . ECHOCARDIOGRAM COMPLETE     Disposition:   Follow up with [facility], Dr Rayann Heman 1 year      Signed, Chanetta Marshall, NP 09/21/2016 10:54 AM  [place] [address] [phone] (office) [phone] (fax

## 2016-09-21 ENCOUNTER — Ambulatory Visit (INDEPENDENT_AMBULATORY_CARE_PROVIDER_SITE_OTHER): Payer: Medicare Other | Admitting: Nurse Practitioner

## 2016-09-21 ENCOUNTER — Encounter (INDEPENDENT_AMBULATORY_CARE_PROVIDER_SITE_OTHER): Payer: Self-pay

## 2016-09-21 ENCOUNTER — Encounter: Payer: Self-pay | Admitting: Nurse Practitioner

## 2016-09-21 VITALS — BP 120/82 | HR 70 | Ht 65.0 in | Wt 159.9 lb

## 2016-09-21 DIAGNOSIS — R001 Bradycardia, unspecified: Secondary | ICD-10-CM | POA: Diagnosis not present

## 2016-09-21 DIAGNOSIS — D151 Benign neoplasm of heart: Secondary | ICD-10-CM

## 2016-09-21 DIAGNOSIS — I480 Paroxysmal atrial fibrillation: Secondary | ICD-10-CM | POA: Diagnosis not present

## 2016-09-21 LAB — CUP PACEART INCLINIC DEVICE CHECK
Implantable Lead Implant Date: 20100729
Implantable Lead Location: 753859
Implantable Pulse Generator Implant Date: 20100729
MDC IDC LEAD IMPLANT DT: 20100729
MDC IDC LEAD LOCATION: 753860
MDC IDC SESS DTM: 20180420103814

## 2016-09-21 NOTE — Patient Instructions (Addendum)
Medication Instructions:  Your physician recommends that you continue on your current medications as directed. Please refer to the Current Medication list given to you today.    If you need a refill on your cardiac medications before your next appointment, please call your pharmacy.  Labwork: NONE ORDERED  TODAY    Testing/Procedures:  Your physician has requested that you have an echocardiogram. Echocardiography is a painless test that uses sound waves to create images of your heart. It provides your doctor with information about the size and shape of your heart and how well your heart's chambers and valves are working. This procedure takes approximately one hour. There are no restrictions for this procedure.     Follow-Up:  Your physician wants you to follow-up in: [place] will receive a reminder letter in the mail two months in advance. If you don't receive a letter, please call our office to schedule the follow-up appointment.  Remote monitoring is used to monitor your Pacemaker of ICD from home. This monitoring reduces the number of office visits required to check your device to one time per year. It allows [place] to keep an eye on the functioning of your device to ensure it is working properly. You are scheduled for a device check from home on .  7*23*18 You may send your transmission at any time that day. If you have a wireless device, the transmission will be sent automatically. After your physician reviews your transmission, you will receive a postcard with your next transmission date.     Any Other Special Instructions Will Be Listed Below (If Applicable).

## 2016-10-05 ENCOUNTER — Ambulatory Visit (HOSPITAL_COMMUNITY): Payer: Medicare Other | Attending: Cardiology

## 2016-10-05 ENCOUNTER — Other Ambulatory Visit: Payer: Self-pay

## 2016-10-05 DIAGNOSIS — D151 Benign neoplasm of heart: Secondary | ICD-10-CM | POA: Diagnosis not present

## 2016-10-05 DIAGNOSIS — I081 Rheumatic disorders of both mitral and tricuspid valves: Secondary | ICD-10-CM | POA: Diagnosis not present

## 2016-12-24 ENCOUNTER — Ambulatory Visit (INDEPENDENT_AMBULATORY_CARE_PROVIDER_SITE_OTHER): Payer: Medicare Other | Admitting: *Deleted

## 2016-12-24 DIAGNOSIS — I495 Sick sinus syndrome: Secondary | ICD-10-CM

## 2016-12-26 NOTE — Progress Notes (Signed)
Remote pacemaker transmission.   

## 2016-12-27 ENCOUNTER — Encounter: Payer: Self-pay | Admitting: Cardiology

## 2017-01-22 LAB — CUP PACEART REMOTE DEVICE CHECK
Battery Remaining Longevity: 75 mo
Brady Statistic AP VP Percent: 1 %
Brady Statistic AP VS Percent: 64 %
Brady Statistic AS VS Percent: 36 %
Implantable Lead Implant Date: 20100729
Lead Channel Impedance Value: 486 Ohm
Lead Channel Impedance Value: 724 Ohm
Lead Channel Pacing Threshold Amplitude: 0.75 V
Lead Channel Setting Pacing Amplitude: 2 V
Lead Channel Setting Sensing Sensitivity: 5.6 mV
MDC IDC LEAD IMPLANT DT: 20100729
MDC IDC LEAD LOCATION: 753859
MDC IDC LEAD LOCATION: 753860
MDC IDC MSMT BATTERY IMPEDANCE: 707 Ohm
MDC IDC MSMT BATTERY VOLTAGE: 2.79 V
MDC IDC MSMT LEADCHNL RA PACING THRESHOLD AMPLITUDE: 0.75 V
MDC IDC MSMT LEADCHNL RA PACING THRESHOLD PULSEWIDTH: 0.4 ms
MDC IDC MSMT LEADCHNL RV PACING THRESHOLD PULSEWIDTH: 0.4 ms
MDC IDC PG IMPLANT DT: 20100729
MDC IDC SESS DTM: 20180723134149
MDC IDC SET LEADCHNL RV PACING AMPLITUDE: 2.5 V
MDC IDC SET LEADCHNL RV PACING PULSEWIDTH: 0.4 ms
MDC IDC STAT BRADY AS VP PERCENT: 0 %

## 2017-03-25 ENCOUNTER — Telehealth: Payer: Self-pay | Admitting: Internal Medicine

## 2017-03-25 ENCOUNTER — Ambulatory Visit (INDEPENDENT_AMBULATORY_CARE_PROVIDER_SITE_OTHER): Payer: Medicare Other | Admitting: *Deleted

## 2017-03-25 DIAGNOSIS — I495 Sick sinus syndrome: Secondary | ICD-10-CM

## 2017-03-25 NOTE — Progress Notes (Signed)
Remote pacemaker transmission.   

## 2017-03-25 NOTE — Telephone Encounter (Signed)
New message    Pt is calling asking for a call back. She has a questions regarding her upcoming dental procedure.

## 2017-03-25 NOTE — Telephone Encounter (Signed)
Returned all to patient.  She is questioning if she needs SBE prior to teeth cleaning.  Did not do it last time she had her teeth cleaned and did fine.  She does not even remember who asked her to start taking prior to teeth cleaning. Let her know I would discuss with MD and call her back

## 2017-03-27 LAB — CUP PACEART REMOTE DEVICE CHECK
Battery Impedance: 783 Ohm
Battery Remaining Longevity: 72 mo
Brady Statistic AS VS Percent: 41 %
Implantable Lead Implant Date: 20100729
Implantable Lead Location: 753860
Lead Channel Impedance Value: 682 Ohm
Lead Channel Pacing Threshold Pulse Width: 0.4 ms
Lead Channel Setting Pacing Amplitude: 2 V
Lead Channel Setting Pacing Amplitude: 2.5 V
Lead Channel Setting Pacing Pulse Width: 0.4 ms
Lead Channel Setting Sensing Sensitivity: 4 mV
MDC IDC LEAD IMPLANT DT: 20100729
MDC IDC LEAD LOCATION: 753859
MDC IDC MSMT BATTERY VOLTAGE: 2.78 V
MDC IDC MSMT LEADCHNL RA IMPEDANCE VALUE: 459 Ohm
MDC IDC MSMT LEADCHNL RA PACING THRESHOLD AMPLITUDE: 0.625 V
MDC IDC MSMT LEADCHNL RA PACING THRESHOLD PULSEWIDTH: 0.4 ms
MDC IDC MSMT LEADCHNL RV PACING THRESHOLD AMPLITUDE: 0.875 V
MDC IDC PG IMPLANT DT: 20100729
MDC IDC SESS DTM: 20181022162123
MDC IDC STAT BRADY AP VP PERCENT: 0 %
MDC IDC STAT BRADY AP VS PERCENT: 58 %
MDC IDC STAT BRADY AS VP PERCENT: 0 %

## 2017-03-29 ENCOUNTER — Encounter: Payer: Self-pay | Admitting: Cardiology

## 2017-03-29 NOTE — Telephone Encounter (Signed)
Discussed with Dr Rayann Heman and does not need SBE. Patient aware

## 2017-06-24 ENCOUNTER — Ambulatory Visit (INDEPENDENT_AMBULATORY_CARE_PROVIDER_SITE_OTHER): Payer: Medicare Other | Admitting: *Deleted

## 2017-06-24 DIAGNOSIS — I495 Sick sinus syndrome: Secondary | ICD-10-CM

## 2017-06-24 NOTE — Progress Notes (Signed)
Remote pacemaker transmission.   

## 2017-06-25 ENCOUNTER — Encounter: Payer: Self-pay | Admitting: Cardiology

## 2017-06-26 LAB — CUP PACEART REMOTE DEVICE CHECK
Battery Remaining Longevity: 71 mo
Brady Statistic AS VS Percent: 41 %
Date Time Interrogation Session: 20190121160514
Implantable Lead Implant Date: 20100729
Implantable Lead Location: 753860
Implantable Pulse Generator Implant Date: 20100729
Lead Channel Impedance Value: 647 Ohm
Lead Channel Pacing Threshold Amplitude: 0.75 V
Lead Channel Pacing Threshold Pulse Width: 0.4 ms
Lead Channel Setting Pacing Amplitude: 2 V
Lead Channel Setting Sensing Sensitivity: 5.6 mV
MDC IDC LEAD IMPLANT DT: 20100729
MDC IDC LEAD LOCATION: 753859
MDC IDC MSMT BATTERY IMPEDANCE: 808 Ohm
MDC IDC MSMT BATTERY VOLTAGE: 2.78 V
MDC IDC MSMT LEADCHNL RA IMPEDANCE VALUE: 479 Ohm
MDC IDC MSMT LEADCHNL RA PACING THRESHOLD AMPLITUDE: 0.625 V
MDC IDC MSMT LEADCHNL RA PACING THRESHOLD PULSEWIDTH: 0.4 ms
MDC IDC SET LEADCHNL RV PACING AMPLITUDE: 2.5 V
MDC IDC SET LEADCHNL RV PACING PULSEWIDTH: 0.4 ms
MDC IDC STAT BRADY AP VP PERCENT: 0 %
MDC IDC STAT BRADY AP VS PERCENT: 59 %
MDC IDC STAT BRADY AS VP PERCENT: 0 %

## 2017-08-27 ENCOUNTER — Encounter: Payer: Self-pay | Admitting: Internal Medicine

## 2017-09-09 ENCOUNTER — Encounter: Payer: Self-pay | Admitting: Internal Medicine

## 2017-09-09 ENCOUNTER — Other Ambulatory Visit: Payer: Self-pay

## 2017-09-09 ENCOUNTER — Ambulatory Visit: Payer: Medicare Other | Admitting: Internal Medicine

## 2017-09-09 VITALS — BP 142/96 | HR 81 | Resp 94 | Ht 65.0 in | Wt 154.0 lb

## 2017-09-09 DIAGNOSIS — Z950 Presence of cardiac pacemaker: Secondary | ICD-10-CM | POA: Diagnosis not present

## 2017-09-09 DIAGNOSIS — D151 Benign neoplasm of heart: Secondary | ICD-10-CM

## 2017-09-09 DIAGNOSIS — I480 Paroxysmal atrial fibrillation: Secondary | ICD-10-CM

## 2017-09-09 DIAGNOSIS — I495 Sick sinus syndrome: Secondary | ICD-10-CM | POA: Diagnosis not present

## 2017-09-09 LAB — CUP PACEART INCLINIC DEVICE CHECK
Battery Voltage: 2.78 V
Brady Statistic AS VP Percent: 0 %
Date Time Interrogation Session: 20190408142157
Implantable Lead Implant Date: 20100729
Implantable Lead Implant Date: 20100729
Implantable Lead Location: 753859
Implantable Lead Location: 753860
Implantable Lead Model: 5076
Lead Channel Impedance Value: 453 Ohm
Lead Channel Pacing Threshold Amplitude: 0.625 V
Lead Channel Pacing Threshold Amplitude: 0.75 V
Lead Channel Pacing Threshold Amplitude: 0.75 V
Lead Channel Sensing Intrinsic Amplitude: 11.2 mV
Lead Channel Setting Pacing Amplitude: 2 V
Lead Channel Setting Pacing Amplitude: 2.5 V
Lead Channel Setting Pacing Pulse Width: 0.4 ms
MDC IDC MSMT BATTERY IMPEDANCE: 883 Ohm
MDC IDC MSMT BATTERY REMAINING LONGEVITY: 67 mo
MDC IDC MSMT LEADCHNL RA PACING THRESHOLD AMPLITUDE: 0.75 V
MDC IDC MSMT LEADCHNL RA PACING THRESHOLD PULSEWIDTH: 0.4 ms
MDC IDC MSMT LEADCHNL RA PACING THRESHOLD PULSEWIDTH: 0.4 ms
MDC IDC MSMT LEADCHNL RA SENSING INTR AMPL: 2.8 mV
MDC IDC MSMT LEADCHNL RV IMPEDANCE VALUE: 599 Ohm
MDC IDC MSMT LEADCHNL RV PACING THRESHOLD PULSEWIDTH: 0.4 ms
MDC IDC MSMT LEADCHNL RV PACING THRESHOLD PULSEWIDTH: 0.4 ms
MDC IDC PG IMPLANT DT: 20100729
MDC IDC SET LEADCHNL RV SENSING SENSITIVITY: 4 mV
MDC IDC STAT BRADY AP VP PERCENT: 0 %
MDC IDC STAT BRADY AP VS PERCENT: 59 %
MDC IDC STAT BRADY AS VS PERCENT: 41 %

## 2017-09-09 NOTE — Progress Notes (Signed)
    PCP: Mateo Flow, MD   Primary EP:  Dr Claretha Cooper is a 84 y.o. female who presents today for routine electrophysiology followup.  Since last being seen in our clinic, the patient reports doing very well.  Today, she denies symptoms of palpitations, chest pain, shortness of breath,  lower extremity edema, dizziness, presyncope, or syncope.  The patient is otherwise without complaint today.   Past Medical History:  Diagnosis Date  . Atrial myxoma    s/p resection  . GERD (gastroesophageal reflux disease)   . Osteopenia   . Paroxysmal atrial fibrillation (HCC)   . Tachy-brady syndrome ([place])    s/p PPM   Past Surgical History:  Procedure Laterality Date  . APPENDECTOMY    . ATRIAL MYXOMA EXCISION  12/24/08   Left -- Dr Roxy Manns  . CHOLECYSTECTOMY    . PACEMAKER INSERTION    . PARTIAL HYSTERECTOMY      ROS- all systems are reviewed and negative except as per HPI above  Current Outpatient Medications  Medication Sig Dispense Refill  . acetaminophen (TYLENOL) 325 MG tablet Take 650 mg by mouth every 6 (six) hours as needed (pain).     . famotidine (PEPCID) 20 MG tablet Take 1 tablet by mouth 2 (two) times daily. Heartburn or acid reflux  0   No current facility-administered medications for this visit.     Physical Exam: Vitals:   09/09/17 1107  BP: (!) 142/96  Pulse: 81  Resp: (!) 94  Weight: 154 lb (69.9 kg)  Height: 5\' 5"  (1.651 m)    GEN- The patient is well appearing, alert and oriented x 3 today.   Head- normocephalic, atraumatic Eyes-  Sclera clear, conjunctiva pink Ears- hearing intact Oropharynx- clear Lungs- Clear to ausculation bilaterally, normal work of breathing Chest- pacemaker pocket is well healed Heart- Regular rate and rhythm, no murmurs, rubs or gallops, PMI not laterally displaced GI- soft, NT, ND, + BS Extremities- no clubbing, cyanosis, or edema  Pacemaker interrogation- reviewed in detail today,  See PACEART report  ekg tracing  ordered today is personally reviewed and shows atrial pacing, incomplete RBBB  Assessment and Plan:  1. Symptomatic sinus bradycardia  Normal pacemaker function See Pace Art report No changes today  2. Atrial myxoma S/p resection Echo 10/05/16 is reviewed and reveals preserved EF, no myxoma recurrence.  Discussed with the patient today. Repeat echo upon return  3. afib afib burden is 0.1 % (0.4% last year) Majority of epiosdes are Atach and all < 1 minute Follow conservatively.  We discussed this today  Could consider anticoagulation if "true AF" burden increases  [facility] Return to see EP NP in a year She is from [place].  We discussed our [place] team providing general cardiology care for her should this be required in the future. I will continue to follow her pacemaker in [place].  She reports that this gives her an excuse to visit her grandchildren who live here.   Thompson Grayer MD, [facility]  09/09/2017 11:15 AM

## 2017-09-09 NOTE — Patient Instructions (Addendum)
Medication Instructions:  Your physician recommends that you continue on your current medications as directed. Please refer to the Current Medication list given to you today.  Labwork: None ordered.  Testing/Procedures: You will get an ECHO when you return next year.  Follow-Up: Your physician wants you to follow-up in: one year with Chanetta Marshall, NP.   You will receive a reminder letter in the mail two months in advance. If you don't receive a letter, please call our office to schedule the follow-up appointment.  Remote monitoring is used to monitor your Pacemaker from home. This monitoring reduces the number of office visits required to check your device to one time per year. It allows [place] to keep an eye on the functioning of your device to ensure it is working properly. You are scheduled for a device check from home on 09/23/2017. You may send your transmission at any time that day. If you have a wireless device, the transmission will be sent automatically. After your physician reviews your transmission, you will receive a postcard with your next transmission date.  Any Other Special Instructions Will Be Listed Below (If Applicable).  If you need a refill on your cardiac medications before your next appointment, please call your pharmacy.

## 2017-09-23 ENCOUNTER — Ambulatory Visit (INDEPENDENT_AMBULATORY_CARE_PROVIDER_SITE_OTHER): Payer: Medicare Other | Admitting: *Deleted

## 2017-09-23 DIAGNOSIS — I495 Sick sinus syndrome: Secondary | ICD-10-CM

## 2017-09-23 NOTE — Progress Notes (Signed)
Remote pacemaker transmission.   

## 2017-09-24 ENCOUNTER — Encounter: Payer: Self-pay | Admitting: Cardiology

## 2017-09-24 LAB — CUP PACEART REMOTE DEVICE CHECK
Battery Impedance: 884 Ohm
Battery Remaining Longevity: 66 mo
Battery Voltage: 2.79 V
Brady Statistic AS VP Percent: 0 %
Brady Statistic AS VS Percent: 33 %
Date Time Interrogation Session: 20190422172228
Implantable Lead Implant Date: 20100729
Implantable Lead Model: 5076
Lead Channel Impedance Value: 715 Ohm
Lead Channel Pacing Threshold Amplitude: 0.625 V
Lead Channel Setting Pacing Amplitude: 2 V
Lead Channel Setting Pacing Amplitude: 2.5 V
Lead Channel Setting Pacing Pulse Width: 0.4 ms
Lead Channel Setting Sensing Sensitivity: 5.6 mV
MDC IDC LEAD IMPLANT DT: 20100729
MDC IDC LEAD LOCATION: 753859
MDC IDC LEAD LOCATION: 753860
MDC IDC MSMT LEADCHNL RA IMPEDANCE VALUE: 472 Ohm
MDC IDC MSMT LEADCHNL RA PACING THRESHOLD PULSEWIDTH: 0.4 ms
MDC IDC MSMT LEADCHNL RV PACING THRESHOLD AMPLITUDE: 0.75 V
MDC IDC MSMT LEADCHNL RV PACING THRESHOLD PULSEWIDTH: 0.4 ms
MDC IDC PG IMPLANT DT: 20100729
MDC IDC STAT BRADY AP VP PERCENT: 0 %
MDC IDC STAT BRADY AP VS PERCENT: 67 %

## 2017-12-23 ENCOUNTER — Ambulatory Visit (INDEPENDENT_AMBULATORY_CARE_PROVIDER_SITE_OTHER): Payer: Medicare Other | Admitting: *Deleted

## 2017-12-23 DIAGNOSIS — I495 Sick sinus syndrome: Secondary | ICD-10-CM | POA: Diagnosis not present

## 2017-12-23 NOTE — Progress Notes (Signed)
Remote pacemaker transmission.   

## 2017-12-24 ENCOUNTER — Encounter: Payer: Self-pay | Admitting: Cardiology

## 2018-01-18 LAB — CUP PACEART REMOTE DEVICE CHECK
Brady Statistic AP VS Percent: 70 %
Brady Statistic AS VP Percent: 0 %
Implantable Lead Implant Date: 20100729
Implantable Lead Location: 753859
Implantable Lead Location: 753860
Implantable Lead Model: 5076
Lead Channel Pacing Threshold Amplitude: 0.625 V
Lead Channel Pacing Threshold Amplitude: 0.875 V
Lead Channel Pacing Threshold Pulse Width: 0.4 ms
Lead Channel Pacing Threshold Pulse Width: 0.4 ms
MDC IDC LEAD IMPLANT DT: 20100729
MDC IDC MSMT BATTERY IMPEDANCE: 959 Ohm
MDC IDC MSMT BATTERY REMAINING LONGEVITY: 64 mo
MDC IDC MSMT BATTERY VOLTAGE: 2.78 V
MDC IDC MSMT LEADCHNL RA IMPEDANCE VALUE: 479 Ohm
MDC IDC MSMT LEADCHNL RV IMPEDANCE VALUE: 772 Ohm
MDC IDC PG IMPLANT DT: 20100729
MDC IDC SESS DTM: 20190722180309
MDC IDC SET LEADCHNL RA PACING AMPLITUDE: 2 V
MDC IDC SET LEADCHNL RV PACING AMPLITUDE: 2.5 V
MDC IDC SET LEADCHNL RV PACING PULSEWIDTH: 0.4 ms
MDC IDC SET LEADCHNL RV SENSING SENSITIVITY: 4 mV
MDC IDC STAT BRADY AP VP PERCENT: 0 %
MDC IDC STAT BRADY AS VS PERCENT: 29 %

## 2018-03-24 ENCOUNTER — Ambulatory Visit (INDEPENDENT_AMBULATORY_CARE_PROVIDER_SITE_OTHER): Payer: Medicare Other | Admitting: *Deleted

## 2018-03-24 ENCOUNTER — Telehealth: Payer: Self-pay

## 2018-03-24 DIAGNOSIS — I495 Sick sinus syndrome: Secondary | ICD-10-CM

## 2018-03-24 NOTE — Telephone Encounter (Signed)
Spoke with pt and reminded pt of remote transmission that is due today. Pt verbalized understanding.   

## 2018-03-25 ENCOUNTER — Encounter: Payer: Self-pay | Admitting: Cardiology

## 2018-03-25 NOTE — Progress Notes (Signed)
Remote pacemaker transmission.   

## 2018-04-15 DIAGNOSIS — D485 Neoplasm of uncertain behavior of skin: Secondary | ICD-10-CM

## 2018-04-15 HISTORY — DX: Neoplasm of uncertain behavior of skin: D48.5

## 2018-04-15 LAB — CUP PACEART REMOTE DEVICE CHECK
Battery Impedance: 1037 Ohm
Battery Voltage: 2.79 V
Brady Statistic AP VS Percent: 74 %
Brady Statistic AS VS Percent: 25 %
Date Time Interrogation Session: 20191021173822
Implantable Lead Implant Date: 20100729
Implantable Lead Location: 753859
Implantable Lead Location: 753860
Implantable Lead Model: 5076
Lead Channel Impedance Value: 453 Ohm
Lead Channel Impedance Value: 727 Ohm
Lead Channel Pacing Threshold Amplitude: 0.875 V
Lead Channel Sensing Intrinsic Amplitude: 2.8 mV
Lead Channel Sensing Intrinsic Amplitude: 8 mV
Lead Channel Setting Pacing Amplitude: 2 V
Lead Channel Setting Pacing Pulse Width: 0.4 ms
MDC IDC LEAD IMPLANT DT: 20100729
MDC IDC MSMT BATTERY REMAINING LONGEVITY: 60 mo
MDC IDC MSMT LEADCHNL RA PACING THRESHOLD AMPLITUDE: 0.625 V
MDC IDC MSMT LEADCHNL RA PACING THRESHOLD PULSEWIDTH: 0.4 ms
MDC IDC MSMT LEADCHNL RV PACING THRESHOLD PULSEWIDTH: 0.4 ms
MDC IDC PG IMPLANT DT: 20100729
MDC IDC SET LEADCHNL RV PACING AMPLITUDE: 2.5 V
MDC IDC SET LEADCHNL RV SENSING SENSITIVITY: 4 mV
MDC IDC STAT BRADY AP VP PERCENT: 0 %
MDC IDC STAT BRADY AS VP PERCENT: 0 %

## 2018-06-23 ENCOUNTER — Ambulatory Visit (INDEPENDENT_AMBULATORY_CARE_PROVIDER_SITE_OTHER): Payer: Medicare Other

## 2018-06-23 DIAGNOSIS — I495 Sick sinus syndrome: Secondary | ICD-10-CM

## 2018-06-24 LAB — CUP PACEART REMOTE DEVICE CHECK
Battery Impedance: 1140 Ohm
Battery Remaining Longevity: 56 mo
Battery Voltage: 2.78 V
Brady Statistic AP VP Percent: 0 %
Brady Statistic AP VS Percent: 73 %
Brady Statistic AS VP Percent: 0 %
Brady Statistic AS VS Percent: 26 %
Date Time Interrogation Session: 20200120155547
Implantable Lead Implant Date: 20100729
Implantable Lead Model: 5076
Implantable Pulse Generator Implant Date: 20100729
Lead Channel Impedance Value: 465 Ohm
Lead Channel Impedance Value: 669 Ohm
Lead Channel Pacing Threshold Amplitude: 0.625 V
Lead Channel Pacing Threshold Amplitude: 0.875 V
Lead Channel Pacing Threshold Pulse Width: 0.4 ms
Lead Channel Setting Pacing Amplitude: 2 V
Lead Channel Setting Pacing Amplitude: 2.5 V
Lead Channel Setting Pacing Pulse Width: 0.4 ms
Lead Channel Setting Sensing Sensitivity: 4 mV
MDC IDC LEAD IMPLANT DT: 20100729
MDC IDC LEAD LOCATION: 753859
MDC IDC LEAD LOCATION: 753860
MDC IDC MSMT LEADCHNL RV PACING THRESHOLD PULSEWIDTH: 0.4 ms

## 2018-06-24 NOTE — Progress Notes (Signed)
Remote pacemaker transmission.   

## 2018-09-15 ENCOUNTER — Encounter: Payer: Medicare Other | Admitting: Internal Medicine

## 2018-09-22 ENCOUNTER — Other Ambulatory Visit: Payer: Self-pay

## 2018-09-22 ENCOUNTER — Ambulatory Visit (INDEPENDENT_AMBULATORY_CARE_PROVIDER_SITE_OTHER): Payer: Medicare Other | Admitting: *Deleted

## 2018-09-22 DIAGNOSIS — I495 Sick sinus syndrome: Secondary | ICD-10-CM | POA: Diagnosis not present

## 2018-09-22 LAB — CUP PACEART REMOTE DEVICE CHECK
Battery Impedance: 1193 Ohm
Battery Remaining Longevity: 55 mo
Battery Voltage: 2.77 V
Brady Statistic AP VP Percent: 0 %
Brady Statistic AP VS Percent: 73 %
Brady Statistic AS VP Percent: 0 %
Brady Statistic AS VS Percent: 27 %
Date Time Interrogation Session: 20200420145804
Implantable Lead Implant Date: 20100729
Implantable Lead Implant Date: 20100729
Implantable Lead Location: 753859
Implantable Lead Location: 753860
Implantable Lead Model: 5076
Implantable Lead Model: 5076
Implantable Pulse Generator Implant Date: 20100729
Lead Channel Impedance Value: 453 Ohm
Lead Channel Impedance Value: 740 Ohm
Lead Channel Pacing Threshold Amplitude: 0.625 V
Lead Channel Pacing Threshold Amplitude: 1 V
Lead Channel Pacing Threshold Pulse Width: 0.4 ms
Lead Channel Pacing Threshold Pulse Width: 0.4 ms
Lead Channel Setting Pacing Amplitude: 2 V
Lead Channel Setting Pacing Amplitude: 2.5 V
Lead Channel Setting Pacing Pulse Width: 0.4 ms
Lead Channel Setting Sensing Sensitivity: 4 mV

## 2018-10-01 ENCOUNTER — Encounter: Payer: Self-pay | Admitting: Cardiology

## 2018-10-01 NOTE — Progress Notes (Signed)
Remote pacemaker transmission.   

## 2018-12-22 ENCOUNTER — Ambulatory Visit (INDEPENDENT_AMBULATORY_CARE_PROVIDER_SITE_OTHER): Payer: Medicare Other | Admitting: *Deleted

## 2018-12-22 DIAGNOSIS — I495 Sick sinus syndrome: Secondary | ICD-10-CM

## 2018-12-22 LAB — CUP PACEART REMOTE DEVICE CHECK
Battery Impedance: 1245 Ohm
Battery Remaining Longevity: 53 mo
Battery Voltage: 2.77 V
Brady Statistic AP VP Percent: 0 %
Brady Statistic AP VS Percent: 74 %
Brady Statistic AS VP Percent: 0 %
Brady Statistic AS VS Percent: 26 %
Date Time Interrogation Session: 20200720173344
Implantable Lead Implant Date: 20100729
Implantable Lead Implant Date: 20100729
Implantable Lead Location: 753859
Implantable Lead Location: 753860
Implantable Lead Model: 5076
Implantable Lead Model: 5076
Implantable Pulse Generator Implant Date: 20100729
Lead Channel Impedance Value: 435 Ohm
Lead Channel Impedance Value: 737 Ohm
Lead Channel Pacing Threshold Amplitude: 0.5 V
Lead Channel Pacing Threshold Amplitude: 1 V
Lead Channel Pacing Threshold Pulse Width: 0.4 ms
Lead Channel Pacing Threshold Pulse Width: 0.4 ms
Lead Channel Setting Pacing Amplitude: 2 V
Lead Channel Setting Pacing Amplitude: 2.5 V
Lead Channel Setting Pacing Pulse Width: 0.4 ms
Lead Channel Setting Sensing Sensitivity: 4 mV

## 2019-01-07 ENCOUNTER — Encounter: Payer: Self-pay | Admitting: Cardiology

## 2019-01-07 NOTE — Progress Notes (Signed)
Remote pacemaker transmission.   

## 2019-03-10 DIAGNOSIS — D3617 Benign neoplasm of peripheral nerves and autonomic nervous system of trunk, unspecified: Secondary | ICD-10-CM | POA: Insufficient documentation

## 2019-03-10 HISTORY — DX: Benign neoplasm of peripheral nerves and autonomic nervous system of trunk, unspecified: D36.17

## 2019-03-23 ENCOUNTER — Ambulatory Visit (INDEPENDENT_AMBULATORY_CARE_PROVIDER_SITE_OTHER): Payer: Medicare Other | Admitting: *Deleted

## 2019-03-23 DIAGNOSIS — I480 Paroxysmal atrial fibrillation: Secondary | ICD-10-CM

## 2019-03-23 DIAGNOSIS — I495 Sick sinus syndrome: Secondary | ICD-10-CM

## 2019-03-24 LAB — CUP PACEART REMOTE DEVICE CHECK
Battery Impedance: 1353 Ohm
Battery Remaining Longevity: 50 mo
Battery Voltage: 2.77 V
Brady Statistic AP VP Percent: 0 %
Brady Statistic AP VS Percent: 74 %
Brady Statistic AS VP Percent: 0 %
Brady Statistic AS VS Percent: 26 %
Date Time Interrogation Session: 20201019142408
Implantable Lead Implant Date: 20100729
Implantable Lead Implant Date: 20100729
Implantable Lead Location: 753859
Implantable Lead Location: 753860
Implantable Lead Model: 5076
Implantable Lead Model: 5076
Implantable Pulse Generator Implant Date: 20100729
Lead Channel Impedance Value: 447 Ohm
Lead Channel Impedance Value: 714 Ohm
Lead Channel Pacing Threshold Amplitude: 0.625 V
Lead Channel Pacing Threshold Amplitude: 1 V
Lead Channel Pacing Threshold Pulse Width: 0.4 ms
Lead Channel Pacing Threshold Pulse Width: 0.4 ms
Lead Channel Setting Pacing Amplitude: 2 V
Lead Channel Setting Pacing Amplitude: 2.5 V
Lead Channel Setting Pacing Pulse Width: 0.4 ms
Lead Channel Setting Sensing Sensitivity: 4 mV

## 2019-04-14 NOTE — Progress Notes (Signed)
Remote pacemaker transmission.   

## 2019-04-20 ENCOUNTER — Telehealth: Payer: Self-pay

## 2019-04-20 NOTE — Telephone Encounter (Signed)
The pt is overdue to see Dr. Rayann Heman and would like to schedule an appointment. She do not mind seeing a NP/PA. I told her I will send a message to the scheduler and the scheduler will give her a call back. The pt phone number is [phone].

## 2019-05-13 ENCOUNTER — Ambulatory Visit: Payer: Medicare Other | Admitting: Student

## 2019-05-13 ENCOUNTER — Encounter: Payer: Self-pay | Admitting: Student

## 2019-05-13 ENCOUNTER — Other Ambulatory Visit: Payer: Self-pay

## 2019-05-13 VITALS — BP 132/92 | HR 89 | Ht 65.0 in | Wt 149.4 lb

## 2019-05-13 DIAGNOSIS — D151 Benign neoplasm of heart: Secondary | ICD-10-CM

## 2019-05-13 DIAGNOSIS — I495 Sick sinus syndrome: Secondary | ICD-10-CM | POA: Diagnosis not present

## 2019-05-13 DIAGNOSIS — I480 Paroxysmal atrial fibrillation: Secondary | ICD-10-CM

## 2019-05-13 LAB — CUP PACEART INCLINIC DEVICE CHECK
Battery Impedance: 1379 Ohm
Battery Remaining Longevity: 49 mo
Battery Voltage: 2.77 V
Brady Statistic AP VP Percent: 0 %
Brady Statistic AP VS Percent: 73 %
Brady Statistic AS VP Percent: 0 %
Brady Statistic AS VS Percent: 27 %
Date Time Interrogation Session: 20201209124206
Implantable Lead Implant Date: 20100729
Implantable Lead Implant Date: 20100729
Implantable Lead Location: 753859
Implantable Lead Location: 753860
Implantable Lead Model: 5076
Implantable Lead Model: 5076
Implantable Pulse Generator Implant Date: 20100729
Lead Channel Impedance Value: 465 Ohm
Lead Channel Impedance Value: 688 Ohm
Lead Channel Pacing Threshold Amplitude: 0.625 V
Lead Channel Pacing Threshold Amplitude: 0.75 V
Lead Channel Pacing Threshold Amplitude: 0.875 V
Lead Channel Pacing Threshold Amplitude: 1 V
Lead Channel Pacing Threshold Pulse Width: 0.4 ms
Lead Channel Pacing Threshold Pulse Width: 0.4 ms
Lead Channel Pacing Threshold Pulse Width: 0.4 ms
Lead Channel Pacing Threshold Pulse Width: 0.4 ms
Lead Channel Sensing Intrinsic Amplitude: 11.2 mV
Lead Channel Sensing Intrinsic Amplitude: 4 mV
Lead Channel Setting Pacing Amplitude: 2 V
Lead Channel Setting Pacing Amplitude: 2.5 V
Lead Channel Setting Pacing Pulse Width: 0.4 ms
Lead Channel Setting Sensing Sensitivity: 4 mV

## 2019-05-13 NOTE — Patient Instructions (Signed)
Medication Instructions:  none *If you need a refill on your cardiac medications before your next appointment, please call your pharmacy*  Lab Work: none If you have labs (blood work) drawn today and your tests are completely normal, you will receive your results only by: Marland Kitchen MyChart Message (if you have MyChart) OR . A paper copy in the mail If you have any lab test that is abnormal or we need to change your treatment, we will call you to review the results.  Testing/Procedures: 6-8 [place] has requested that you have an echocardiogram. Echocardiography is a painless test that uses sound waves to create images of your heart. It provides your doctor with information about the size and shape of your heart and how well your heart's chambers and valves are working. This procedure takes approximately one hour. There are no restrictions for this procedure.    Follow-Up: At [facility], you and your health needs are our priority.  As part of our continuing mission to provide you with exceptional heart care, we have created designated Provider Care Teams.  These Care Teams include your primary Cardiologist (physician) and Advanced Practice Providers (APPs -  Physician Assistants and Nurse Practitioners) who all work together to provide you with the care you need, when you need it.  Your next appointment:   1 year(s)  The format for your next appointment:   Either In Person or Virtual  Provider:   Dr Rayann Heman  Other Instructions Remote monitoring is used to monitor your Pacemaker  from home. This monitoring reduces the number of office visits required to check your device to one time per year. It allows [place] to keep an eye on the functioning of your device to ensure it is working properly. You are scheduled for a device check from home on 06/22/19. You may send your transmission at any time that day. If you have a wireless device, the transmission will be sent  automatically. After your physician reviews your transmission, you will receive a postcard with your next transmission date.

## 2019-05-13 NOTE — Progress Notes (Addendum)
Electrophysiology Office Note Date: 05/13/2019  ID:  Cassandra Romero, DOB Dec 27, 1932, MRN [id number]  PCP: Mateo Flow, MD Primary Cardiologist: No primary care provider on file. Electrophysiologist: Dr. Rayann Heman   CC: Pacemaker follow-up  Cassandra Romero is a 85 y.o. female seen today for Dr. Rayann Heman . she presents today for routine electrophysiology followup.  Since last being seen in our clinic, the patient reports doing very well.  she denies chest pain, dyspnea, PND, orthopnea, nausea, vomiting, dizziness, syncope, edema, weight gain, or early satiety.    Her husband and her are moving from their house to a smaller condo. She is looking forward to it but they have been waiting for over a year now, things were delayed with COVID.   Device History: St. Jude Dual Chamber PPM implanted 12/30/2008 for SSS/Brady-tachy  Past Medical History:  Diagnosis Date  . Atrial myxoma    s/p resection  . GERD (gastroesophageal reflux disease)   . Osteopenia   . Paroxysmal atrial fibrillation (HCC)   . Tachy-brady syndrome ([place])    s/p PPM   Past Surgical History:  Procedure Laterality Date  . APPENDECTOMY    . ATRIAL MYXOMA EXCISION  12/24/08   Left -- Dr Roxy Manns  . CHOLECYSTECTOMY    . PACEMAKER INSERTION    . PARTIAL HYSTERECTOMY      Current Outpatient Medications  Medication Sig Dispense Refill  . acetaminophen (TYLENOL) 325 MG tablet Take 650 mg by mouth every 6 (six) hours as needed (pain).     . famotidine (PEPCID) 20 MG tablet Take 1 tablet by mouth 2 (two) times daily. Heartburn or acid reflux  0   No current facility-administered medications for this visit.     Allergies:   Aspirin, Alendronate sodium, Amiodarone hcl, Ibandronate sodium, Metaxalone, and Risedronate sodium   Social History: Social History   Socioeconomic History  . Marital status: Married    Spouse name: Not on file  . Number of children: Not on file  . Years of education: Not on file  . Highest education  level: Not on file  Occupational History  . Occupation: retired Tour manager  . Financial resource strain: Not on file  . Food insecurity    Worry: Not on file    Inability: Not on file  . Transportation needs    Medical: Not on file    Non-medical: Not on file  Tobacco Use  . Smoking status: Former Smoker    Quit date: 06/04/1988    Years since quitting: 30.9  . Smokeless tobacco: Never Used  Substance and Sexual Activity  . Alcohol use: Yes    Comment: maybe 1 a month  . Drug use: No  . Sexual activity: Not on file  Lifestyle  . Physical activity    Days per week: Not on file    Minutes per session: Not on file  . Stress: Not on file  Relationships  . Social Herbalist on phone: Not on file    Gets together: Not on file    Attends religious service: Not on file    Active member of club or organization: Not on file    Attends meetings of clubs or organizations: Not on file    Relationship status: Not on file  . Intimate partner violence    Fear of current or ex partner: Not on file    Emotionally abused: Not on file    Physically abused: Not on  file    Forced sexual activity: Not on file  Other Topics Concern  . Not on file  Social History Narrative   Lives in [place]    Family History: Family History  Problem Relation Age of Onset  . Pancreatic cancer Mother   . Transient ischemic attack Mother        numerous  . Dementia Mother   . Thyroid disease Mother   . Heart disease Father   . Heart attack Father 65  . Pancreatic cancer Unknown   . Dementia Unknown        parathyroid removal  . Coronary artery disease Unknown      Review of Systems: All other systems reviewed and are otherwise negative except as noted above.  Physical Exam: Vitals:   05/13/19 1100  BP: (!) 132/92  Pulse: 89  SpO2: 95%  Weight: 149 lb 6.4 oz (67.8 kg)  Height: 5\' 5"  (1.651 m)     GEN- The patient is well appearing, alert and oriented x 3 today.    HEENT: normocephalic, atraumatic; sclera clear, conjunctiva pink; hearing intact; oropharynx clear; neck supple  Lungs- Clear to ausculation bilaterally, normal work of breathing.  No wheezes, rales, rhonchi Heart- Regular rate and rhythm, no murmurs, rubs or gallops  GI- soft, non-tender, non-distended, bowel sounds present  Extremities- no clubbing, cyanosis, or edema  MS- no significant deformity or atrophy Skin- warm and dry, no rash or lesion; PPM pocket well healed Psych- euthymic mood, full affect Neuro- strength and sensation are intact  PPM Interrogation- reviewed in detail today,  See PACEART report  EKG:  EKG is ordered today. The ekg ordered today shows NSR at 80 bpm, QRS 96 ms, PR interval 138 ms. Personally reviewed.   Recent Labs: No results found for requested labs within last 8760 hours.   Wt Readings from Last 3 Encounters:  09/09/17 154 lb (69.9 kg)  09/21/16 159 lb 14.4 oz (72.5 kg)  08/29/15 156 lb 6 oz (70.9 kg)     Other studies Reviewed: Additional studies/ records that were reviewed today include: Echo 10/2016 shows LVEF 55-60%, no recurrent myxoma; Previous EP office notes, Previous remote checks, Most recent labwork.   Assessment and Plan:  1.  Symptomatic bradycardia s/p Medtronic PPM  Normal PPM function approx 4 years of battery life remain at current pacing %. See Pace Art report No changes today  2. Atrial myxoma s/p resection Echo 10/05/2016 showed preserved EF and no recurrence Repeat echo  3. Paroxysmal Atrial fibrillation Afib burden is <0.1% Follow conservatively Re-iterated possible need for [place] if true burden increases.    Current medicines are reviewed at length with the patient today.   The patient does not have concerns regarding her medicines.  The following changes were made today:  none  Labs/ tests ordered today include:  Orders Placed This Encounter  Procedures  . EKG 12-Lead  . ECHOCARDIOGRAM COMPLETE    Disposition:    Follow up with Dr. Rayann Heman in 12 Months    Signed, Shirley Friar, PA-C  05/13/2019 8:04 AM  [facility] [address] [place] [address] [phone] (office) [phone] (fax)

## 2019-06-22 ENCOUNTER — Ambulatory Visit (INDEPENDENT_AMBULATORY_CARE_PROVIDER_SITE_OTHER): Payer: Medicare PPO | Admitting: *Deleted

## 2019-06-22 DIAGNOSIS — I495 Sick sinus syndrome: Secondary | ICD-10-CM | POA: Diagnosis not present

## 2019-06-22 LAB — CUP PACEART REMOTE DEVICE CHECK
Battery Impedance: 1434 Ohm
Battery Remaining Longevity: 48 mo
Battery Voltage: 2.76 V
Brady Statistic AP VP Percent: 0 %
Brady Statistic AP VS Percent: 70 %
Brady Statistic AS VP Percent: 0 %
Brady Statistic AS VS Percent: 30 %
Date Time Interrogation Session: 20210118121321
Implantable Lead Implant Date: 20100729
Implantable Lead Implant Date: 20100729
Implantable Lead Location: 753859
Implantable Lead Location: 753860
Implantable Lead Model: 5076
Implantable Lead Model: 5076
Implantable Pulse Generator Implant Date: 20100729
Lead Channel Impedance Value: 454 Ohm
Lead Channel Impedance Value: 640 Ohm
Lead Channel Pacing Threshold Amplitude: 0.625 V
Lead Channel Pacing Threshold Amplitude: 0.875 V
Lead Channel Pacing Threshold Pulse Width: 0.4 ms
Lead Channel Pacing Threshold Pulse Width: 0.4 ms
Lead Channel Setting Pacing Amplitude: 2 V
Lead Channel Setting Pacing Amplitude: 2.5 V
Lead Channel Setting Pacing Pulse Width: 0.4 ms
Lead Channel Setting Sensing Sensitivity: 4 mV

## 2019-06-23 NOTE — Progress Notes (Signed)
PPM remote 

## 2019-06-24 ENCOUNTER — Ambulatory Visit (HOSPITAL_COMMUNITY): Payer: Medicare PPO | Attending: Cardiovascular Disease

## 2019-06-24 ENCOUNTER — Encounter (INDEPENDENT_AMBULATORY_CARE_PROVIDER_SITE_OTHER): Payer: Self-pay

## 2019-06-24 ENCOUNTER — Other Ambulatory Visit: Payer: Self-pay

## 2019-06-24 DIAGNOSIS — I480 Paroxysmal atrial fibrillation: Secondary | ICD-10-CM | POA: Diagnosis not present

## 2019-06-24 DIAGNOSIS — D151 Benign neoplasm of heart: Secondary | ICD-10-CM | POA: Diagnosis not present

## 2019-06-29 ENCOUNTER — Telehealth: Payer: Self-pay

## 2019-06-29 NOTE — Telephone Encounter (Signed)
-----   Message from Shirley Friar, PA-C sent at 06/29/2019  9:00 AM EST ----- Please let her know her EF is normal, and no recurrent myxoma noted.    Thank you!  Legrand Como "[employer], PA-C  06/29/2019 9:00 AM

## 2019-06-29 NOTE — Telephone Encounter (Signed)
The patient has been notified of the Echo result and verbalized understanding.  All questions (if any) were answered. Frederik Schmidt, RN 06/29/2019 10:02 AM

## 2019-07-31 DIAGNOSIS — Z6824 Body mass index (BMI) 24.0-24.9, adult: Secondary | ICD-10-CM | POA: Diagnosis not present

## 2019-07-31 DIAGNOSIS — M189 Osteoarthritis of first carpometacarpal joint, unspecified: Secondary | ICD-10-CM | POA: Diagnosis not present

## 2019-07-31 DIAGNOSIS — Z0000 Encounter for general adult medical examination without abnormal findings: Secondary | ICD-10-CM | POA: Diagnosis not present

## 2019-09-12 DIAGNOSIS — R079 Chest pain, unspecified: Secondary | ICD-10-CM | POA: Diagnosis not present

## 2019-09-12 DIAGNOSIS — Z86018 Personal history of other benign neoplasm: Secondary | ICD-10-CM | POA: Diagnosis not present

## 2019-09-12 DIAGNOSIS — Z950 Presence of cardiac pacemaker: Secondary | ICD-10-CM | POA: Diagnosis not present

## 2019-09-12 DIAGNOSIS — M1990 Unspecified osteoarthritis, unspecified site: Secondary | ICD-10-CM | POA: Diagnosis not present

## 2019-09-12 DIAGNOSIS — I10 Essential (primary) hypertension: Secondary | ICD-10-CM | POA: Diagnosis not present

## 2019-09-12 DIAGNOSIS — I340 Nonrheumatic mitral (valve) insufficiency: Secondary | ICD-10-CM | POA: Diagnosis not present

## 2019-09-12 DIAGNOSIS — R0902 Hypoxemia: Secondary | ICD-10-CM | POA: Diagnosis not present

## 2019-09-12 DIAGNOSIS — R072 Precordial pain: Secondary | ICD-10-CM | POA: Diagnosis not present

## 2019-09-12 DIAGNOSIS — J189 Pneumonia, unspecified organism: Secondary | ICD-10-CM | POA: Diagnosis not present

## 2019-09-12 DIAGNOSIS — E7800 Pure hypercholesterolemia, unspecified: Secondary | ICD-10-CM | POA: Diagnosis not present

## 2019-09-12 DIAGNOSIS — I2699 Other pulmonary embolism without acute cor pulmonale: Secondary | ICD-10-CM | POA: Diagnosis not present

## 2019-09-12 DIAGNOSIS — Z8719 Personal history of other diseases of the digestive system: Secondary | ICD-10-CM | POA: Diagnosis not present

## 2019-09-12 DIAGNOSIS — E785 Hyperlipidemia, unspecified: Secondary | ICD-10-CM | POA: Diagnosis not present

## 2019-09-12 DIAGNOSIS — Z7902 Long term (current) use of antithrombotics/antiplatelets: Secondary | ICD-10-CM | POA: Diagnosis not present

## 2019-09-12 DIAGNOSIS — I361 Nonrheumatic tricuspid (valve) insufficiency: Secondary | ICD-10-CM | POA: Diagnosis not present

## 2019-09-12 DIAGNOSIS — R0789 Other chest pain: Secondary | ICD-10-CM | POA: Diagnosis not present

## 2019-09-12 DIAGNOSIS — K219 Gastro-esophageal reflux disease without esophagitis: Secondary | ICD-10-CM | POA: Diagnosis not present

## 2019-09-12 DIAGNOSIS — Z743 Need for continuous supervision: Secondary | ICD-10-CM | POA: Diagnosis not present

## 2019-09-13 DIAGNOSIS — Z8719 Personal history of other diseases of the digestive system: Secondary | ICD-10-CM | POA: Diagnosis not present

## 2019-09-13 DIAGNOSIS — M1990 Unspecified osteoarthritis, unspecified site: Secondary | ICD-10-CM | POA: Diagnosis not present

## 2019-09-13 DIAGNOSIS — J189 Pneumonia, unspecified organism: Secondary | ICD-10-CM | POA: Diagnosis not present

## 2019-09-13 DIAGNOSIS — Z950 Presence of cardiac pacemaker: Secondary | ICD-10-CM | POA: Diagnosis not present

## 2019-09-13 DIAGNOSIS — Z7902 Long term (current) use of antithrombotics/antiplatelets: Secondary | ICD-10-CM | POA: Diagnosis not present

## 2019-09-13 DIAGNOSIS — I340 Nonrheumatic mitral (valve) insufficiency: Secondary | ICD-10-CM | POA: Diagnosis not present

## 2019-09-13 DIAGNOSIS — I2699 Other pulmonary embolism without acute cor pulmonale: Secondary | ICD-10-CM | POA: Diagnosis not present

## 2019-09-13 DIAGNOSIS — Z86018 Personal history of other benign neoplasm: Secondary | ICD-10-CM | POA: Diagnosis not present

## 2019-09-13 DIAGNOSIS — R079 Chest pain, unspecified: Secondary | ICD-10-CM | POA: Diagnosis not present

## 2019-09-13 DIAGNOSIS — E7800 Pure hypercholesterolemia, unspecified: Secondary | ICD-10-CM | POA: Diagnosis not present

## 2019-09-13 DIAGNOSIS — E785 Hyperlipidemia, unspecified: Secondary | ICD-10-CM | POA: Diagnosis not present

## 2019-09-13 DIAGNOSIS — I361 Nonrheumatic tricuspid (valve) insufficiency: Secondary | ICD-10-CM | POA: Diagnosis not present

## 2019-09-13 DIAGNOSIS — K219 Gastro-esophageal reflux disease without esophagitis: Secondary | ICD-10-CM | POA: Diagnosis not present

## 2019-09-14 DIAGNOSIS — I2699 Other pulmonary embolism without acute cor pulmonale: Secondary | ICD-10-CM | POA: Diagnosis not present

## 2019-09-14 DIAGNOSIS — Z950 Presence of cardiac pacemaker: Secondary | ICD-10-CM | POA: Diagnosis not present

## 2019-09-14 DIAGNOSIS — E7800 Pure hypercholesterolemia, unspecified: Secondary | ICD-10-CM | POA: Diagnosis not present

## 2019-09-14 DIAGNOSIS — Z86018 Personal history of other benign neoplasm: Secondary | ICD-10-CM | POA: Diagnosis not present

## 2019-09-14 DIAGNOSIS — K219 Gastro-esophageal reflux disease without esophagitis: Secondary | ICD-10-CM | POA: Diagnosis not present

## 2019-09-14 DIAGNOSIS — I361 Nonrheumatic tricuspid (valve) insufficiency: Secondary | ICD-10-CM | POA: Diagnosis not present

## 2019-09-14 DIAGNOSIS — E785 Hyperlipidemia, unspecified: Secondary | ICD-10-CM | POA: Diagnosis not present

## 2019-09-14 DIAGNOSIS — R079 Chest pain, unspecified: Secondary | ICD-10-CM | POA: Diagnosis not present

## 2019-09-14 DIAGNOSIS — M1990 Unspecified osteoarthritis, unspecified site: Secondary | ICD-10-CM | POA: Diagnosis not present

## 2019-09-14 DIAGNOSIS — I340 Nonrheumatic mitral (valve) insufficiency: Secondary | ICD-10-CM | POA: Diagnosis not present

## 2019-09-14 DIAGNOSIS — J189 Pneumonia, unspecified organism: Secondary | ICD-10-CM | POA: Diagnosis not present

## 2019-09-14 DIAGNOSIS — Z7902 Long term (current) use of antithrombotics/antiplatelets: Secondary | ICD-10-CM | POA: Diagnosis not present

## 2019-09-14 DIAGNOSIS — Z8719 Personal history of other diseases of the digestive system: Secondary | ICD-10-CM | POA: Diagnosis not present

## 2019-09-15 DIAGNOSIS — M1990 Unspecified osteoarthritis, unspecified site: Secondary | ICD-10-CM | POA: Diagnosis not present

## 2019-09-15 DIAGNOSIS — R079 Chest pain, unspecified: Secondary | ICD-10-CM | POA: Diagnosis not present

## 2019-09-15 DIAGNOSIS — K219 Gastro-esophageal reflux disease without esophagitis: Secondary | ICD-10-CM | POA: Diagnosis not present

## 2019-09-15 DIAGNOSIS — Z8719 Personal history of other diseases of the digestive system: Secondary | ICD-10-CM | POA: Diagnosis not present

## 2019-09-15 DIAGNOSIS — I2699 Other pulmonary embolism without acute cor pulmonale: Secondary | ICD-10-CM | POA: Diagnosis not present

## 2019-09-15 DIAGNOSIS — Z950 Presence of cardiac pacemaker: Secondary | ICD-10-CM | POA: Diagnosis not present

## 2019-09-15 DIAGNOSIS — E7800 Pure hypercholesterolemia, unspecified: Secondary | ICD-10-CM | POA: Diagnosis not present

## 2019-09-15 DIAGNOSIS — E785 Hyperlipidemia, unspecified: Secondary | ICD-10-CM | POA: Diagnosis not present

## 2019-09-15 DIAGNOSIS — Z7902 Long term (current) use of antithrombotics/antiplatelets: Secondary | ICD-10-CM | POA: Diagnosis not present

## 2019-09-15 DIAGNOSIS — J189 Pneumonia, unspecified organism: Secondary | ICD-10-CM | POA: Diagnosis not present

## 2019-09-21 ENCOUNTER — Ambulatory Visit (INDEPENDENT_AMBULATORY_CARE_PROVIDER_SITE_OTHER): Payer: Medicare PPO | Admitting: *Deleted

## 2019-09-21 DIAGNOSIS — I495 Sick sinus syndrome: Secondary | ICD-10-CM

## 2019-09-22 LAB — CUP PACEART REMOTE DEVICE CHECK
Battery Impedance: 1542 Ohm
Battery Remaining Longevity: 44 mo
Battery Voltage: 2.76 V
Brady Statistic AP VP Percent: 0 %
Brady Statistic AP VS Percent: 72 %
Brady Statistic AS VP Percent: 0 %
Brady Statistic AS VS Percent: 28 %
Date Time Interrogation Session: 20210419073120
Implantable Lead Implant Date: 20100729
Implantable Lead Implant Date: 20100729
Implantable Lead Location: 753859
Implantable Lead Location: 753860
Implantable Lead Model: 5076
Implantable Lead Model: 5076
Implantable Pulse Generator Implant Date: 20100729
Lead Channel Impedance Value: 442 Ohm
Lead Channel Impedance Value: 580 Ohm
Lead Channel Pacing Threshold Amplitude: 0.625 V
Lead Channel Pacing Threshold Amplitude: 1 V
Lead Channel Pacing Threshold Pulse Width: 0.4 ms
Lead Channel Pacing Threshold Pulse Width: 0.4 ms
Lead Channel Setting Pacing Amplitude: 2 V
Lead Channel Setting Pacing Amplitude: 2.5 V
Lead Channel Setting Pacing Pulse Width: 0.4 ms
Lead Channel Setting Sensing Sensitivity: 4 mV

## 2019-09-22 NOTE — Progress Notes (Signed)
PPM Remote  

## 2019-09-24 DIAGNOSIS — Z8719 Personal history of other diseases of the digestive system: Secondary | ICD-10-CM | POA: Diagnosis not present

## 2019-09-24 DIAGNOSIS — Z6824 Body mass index (BMI) 24.0-24.9, adult: Secondary | ICD-10-CM | POA: Diagnosis not present

## 2019-09-24 DIAGNOSIS — I2699 Other pulmonary embolism without acute cor pulmonale: Secondary | ICD-10-CM | POA: Diagnosis not present

## 2019-11-21 DIAGNOSIS — R21 Rash and other nonspecific skin eruption: Secondary | ICD-10-CM | POA: Diagnosis not present

## 2019-12-21 ENCOUNTER — Ambulatory Visit (INDEPENDENT_AMBULATORY_CARE_PROVIDER_SITE_OTHER): Payer: Medicare PPO | Admitting: *Deleted

## 2019-12-21 DIAGNOSIS — I495 Sick sinus syndrome: Secondary | ICD-10-CM | POA: Diagnosis not present

## 2019-12-22 LAB — CUP PACEART REMOTE DEVICE CHECK
Battery Impedance: 1627 Ohm
Battery Remaining Longevity: 43 mo
Battery Voltage: 2.76 V
Brady Statistic AP VP Percent: 0 %
Brady Statistic AP VS Percent: 74 %
Brady Statistic AS VP Percent: 0 %
Brady Statistic AS VS Percent: 26 %
Date Time Interrogation Session: 20210719173836
Implantable Lead Implant Date: 20100729
Implantable Lead Implant Date: 20100729
Implantable Lead Location: 753859
Implantable Lead Location: 753860
Implantable Lead Model: 5076
Implantable Lead Model: 5076
Implantable Pulse Generator Implant Date: 20100729
Lead Channel Impedance Value: 436 Ohm
Lead Channel Impedance Value: 670 Ohm
Lead Channel Pacing Threshold Amplitude: 0.625 V
Lead Channel Pacing Threshold Amplitude: 1 V
Lead Channel Pacing Threshold Pulse Width: 0.4 ms
Lead Channel Pacing Threshold Pulse Width: 0.4 ms
Lead Channel Setting Pacing Amplitude: 2 V
Lead Channel Setting Pacing Amplitude: 2.5 V
Lead Channel Setting Pacing Pulse Width: 0.4 ms
Lead Channel Setting Sensing Sensitivity: 2.8 mV

## 2019-12-22 NOTE — Progress Notes (Signed)
Remote pacemaker transmission.   

## 2020-03-18 DIAGNOSIS — I2699 Other pulmonary embolism without acute cor pulmonale: Secondary | ICD-10-CM | POA: Diagnosis not present

## 2020-03-18 DIAGNOSIS — R079 Chest pain, unspecified: Secondary | ICD-10-CM | POA: Diagnosis not present

## 2020-03-18 DIAGNOSIS — I517 Cardiomegaly: Secondary | ICD-10-CM | POA: Diagnosis not present

## 2020-03-21 ENCOUNTER — Ambulatory Visit (INDEPENDENT_AMBULATORY_CARE_PROVIDER_SITE_OTHER): Payer: Medicare PPO

## 2020-03-21 DIAGNOSIS — Z7901 Long term (current) use of anticoagulants: Secondary | ICD-10-CM | POA: Diagnosis not present

## 2020-03-21 DIAGNOSIS — H6091 Unspecified otitis externa, right ear: Secondary | ICD-10-CM | POA: Diagnosis not present

## 2020-03-21 DIAGNOSIS — H6123 Impacted cerumen, bilateral: Secondary | ICD-10-CM | POA: Diagnosis not present

## 2020-03-21 DIAGNOSIS — I495 Sick sinus syndrome: Secondary | ICD-10-CM | POA: Diagnosis not present

## 2020-03-21 DIAGNOSIS — H9193 Unspecified hearing loss, bilateral: Secondary | ICD-10-CM | POA: Diagnosis not present

## 2020-03-21 DIAGNOSIS — Z974 Presence of external hearing-aid: Secondary | ICD-10-CM | POA: Diagnosis not present

## 2020-03-22 LAB — CUP PACEART REMOTE DEVICE CHECK
Battery Impedance: 1680 Ohm
Battery Remaining Longevity: 41 mo
Battery Voltage: 2.76 V
Brady Statistic AP VP Percent: 0 %
Brady Statistic AP VS Percent: 75 %
Brady Statistic AS VP Percent: 0 %
Brady Statistic AS VS Percent: 25 %
Date Time Interrogation Session: 20211018203241
Implantable Lead Implant Date: 20100729
Implantable Lead Implant Date: 20100729
Implantable Lead Location: 753859
Implantable Lead Location: 753860
Implantable Lead Model: 5076
Implantable Lead Model: 5076
Implantable Pulse Generator Implant Date: 20100729
Lead Channel Impedance Value: 431 Ohm
Lead Channel Impedance Value: 665 Ohm
Lead Channel Pacing Threshold Amplitude: 0.625 V
Lead Channel Pacing Threshold Amplitude: 0.875 V
Lead Channel Pacing Threshold Pulse Width: 0.4 ms
Lead Channel Pacing Threshold Pulse Width: 0.4 ms
Lead Channel Setting Pacing Amplitude: 2 V
Lead Channel Setting Pacing Amplitude: 2.5 V
Lead Channel Setting Pacing Pulse Width: 0.4 ms
Lead Channel Setting Sensing Sensitivity: 2.8 mV

## 2020-03-28 NOTE — Progress Notes (Signed)
Remote pacemaker transmission.   

## 2020-04-04 DIAGNOSIS — Z1231 Encounter for screening mammogram for malignant neoplasm of breast: Secondary | ICD-10-CM | POA: Diagnosis not present

## 2020-04-18 DIAGNOSIS — N6012 Diffuse cystic mastopathy of left breast: Secondary | ICD-10-CM | POA: Diagnosis not present

## 2020-04-18 DIAGNOSIS — D485 Neoplasm of uncertain behavior of skin: Secondary | ICD-10-CM | POA: Diagnosis not present

## 2020-04-18 DIAGNOSIS — N6011 Diffuse cystic mastopathy of right breast: Secondary | ICD-10-CM | POA: Diagnosis not present

## 2020-05-23 DIAGNOSIS — Z23 Encounter for immunization: Secondary | ICD-10-CM | POA: Diagnosis not present

## 2020-05-23 DIAGNOSIS — M5441 Lumbago with sciatica, right side: Secondary | ICD-10-CM | POA: Diagnosis not present

## 2020-05-23 DIAGNOSIS — Z2821 Immunization not carried out because of patient refusal: Secondary | ICD-10-CM | POA: Diagnosis not present

## 2020-05-23 DIAGNOSIS — S161XXA Strain of muscle, fascia and tendon at neck level, initial encounter: Secondary | ICD-10-CM | POA: Diagnosis not present

## 2020-05-23 DIAGNOSIS — Z9181 History of falling: Secondary | ICD-10-CM | POA: Diagnosis not present

## 2020-05-23 DIAGNOSIS — G8929 Other chronic pain: Secondary | ICD-10-CM | POA: Diagnosis not present

## 2020-05-23 DIAGNOSIS — Z1331 Encounter for screening for depression: Secondary | ICD-10-CM | POA: Diagnosis not present

## 2020-05-31 DIAGNOSIS — D485 Neoplasm of uncertain behavior of skin: Secondary | ICD-10-CM | POA: Diagnosis not present

## 2020-05-31 DIAGNOSIS — B079 Viral wart, unspecified: Secondary | ICD-10-CM | POA: Diagnosis not present

## 2020-06-13 DIAGNOSIS — B079 Viral wart, unspecified: Secondary | ICD-10-CM

## 2020-06-13 HISTORY — DX: Viral wart, unspecified: B07.9

## 2020-06-22 DIAGNOSIS — R1033 Periumbilical pain: Secondary | ICD-10-CM | POA: Diagnosis not present

## 2020-06-22 DIAGNOSIS — R1032 Left lower quadrant pain: Secondary | ICD-10-CM | POA: Diagnosis not present

## 2020-06-22 DIAGNOSIS — K219 Gastro-esophageal reflux disease without esophagitis: Secondary | ICD-10-CM | POA: Diagnosis not present

## 2020-06-22 DIAGNOSIS — Z6822 Body mass index (BMI) 22.0-22.9, adult: Secondary | ICD-10-CM | POA: Diagnosis not present

## 2020-06-22 DIAGNOSIS — R634 Abnormal weight loss: Secondary | ICD-10-CM | POA: Diagnosis not present

## 2020-06-28 DIAGNOSIS — R634 Abnormal weight loss: Secondary | ICD-10-CM | POA: Diagnosis not present

## 2020-06-28 DIAGNOSIS — K5900 Constipation, unspecified: Secondary | ICD-10-CM | POA: Diagnosis not present

## 2020-06-28 DIAGNOSIS — N281 Cyst of kidney, acquired: Secondary | ICD-10-CM | POA: Diagnosis not present

## 2020-06-28 DIAGNOSIS — N2889 Other specified disorders of kidney and ureter: Secondary | ICD-10-CM | POA: Diagnosis not present

## 2020-06-29 ENCOUNTER — Ambulatory Visit (INDEPENDENT_AMBULATORY_CARE_PROVIDER_SITE_OTHER): Payer: Medicare PPO

## 2020-06-29 DIAGNOSIS — I495 Sick sinus syndrome: Secondary | ICD-10-CM | POA: Diagnosis not present

## 2020-06-29 LAB — CUP PACEART REMOTE DEVICE CHECK
Battery Impedance: 1762 Ohm
Battery Remaining Longevity: 40 mo
Battery Voltage: 2.76 V
Brady Statistic AP VP Percent: 0 %
Brady Statistic AP VS Percent: 70 %
Brady Statistic AS VP Percent: 0 %
Brady Statistic AS VS Percent: 30 %
Date Time Interrogation Session: 20220125203312
Implantable Lead Implant Date: 20100729
Implantable Lead Implant Date: 20100729
Implantable Lead Location: 753859
Implantable Lead Location: 753860
Implantable Lead Model: 5076
Implantable Lead Model: 5076
Implantable Pulse Generator Implant Date: 20100729
Lead Channel Impedance Value: 460 Ohm
Lead Channel Impedance Value: 648 Ohm
Lead Channel Pacing Threshold Amplitude: 0.625 V
Lead Channel Pacing Threshold Amplitude: 0.875 V
Lead Channel Pacing Threshold Pulse Width: 0.4 ms
Lead Channel Pacing Threshold Pulse Width: 0.4 ms
Lead Channel Setting Pacing Amplitude: 2 V
Lead Channel Setting Pacing Amplitude: 2.5 V
Lead Channel Setting Pacing Pulse Width: 0.4 ms
Lead Channel Setting Sensing Sensitivity: 4 mV

## 2020-07-11 NOTE — Progress Notes (Signed)
Remote pacemaker transmission.   

## 2020-07-28 ENCOUNTER — Other Ambulatory Visit: Payer: Self-pay | Admitting: Urology

## 2020-07-28 DIAGNOSIS — D49511 Neoplasm of unspecified behavior of right kidney: Secondary | ICD-10-CM | POA: Diagnosis not present

## 2020-07-28 DIAGNOSIS — N2889 Other specified disorders of kidney and ureter: Secondary | ICD-10-CM

## 2020-08-03 ENCOUNTER — Other Ambulatory Visit: Payer: Self-pay

## 2020-08-03 ENCOUNTER — Ambulatory Visit
Admission: RE | Admit: 2020-08-03 | Discharge: 2020-08-03 | Disposition: A | Discharge status: Home / Self Care | Payer: Medicare PPO | Source: Ambulatory Visit | Attending: Urology | Admitting: Urology

## 2020-08-03 ENCOUNTER — Encounter: Payer: Self-pay | Admitting: *Deleted

## 2020-08-03 DIAGNOSIS — N2889 Other specified disorders of kidney and ureter: Secondary | ICD-10-CM | POA: Diagnosis not present

## 2020-08-03 HISTORY — PX: IR RADIOLOGIST EVAL & MGMT: IMG5224

## 2020-08-03 NOTE — Consult Note (Signed)
Chief Complaint: Patient was consulted remotely today (TeleHealth) for right renal mass at the request of Cassandra Romero.    Referring Physician(s): Cassandra Romero  History of Present Illness: Cassandra Romero is a 87 y.o. female who was recently found to have a 1.7 x 1.4 x 1.5 cm solid enhancing mass in the posterior aspect of the lower pole of the right kidney concerning for renal cell carcinoma.  Her most recent imaging was on 06/28/2020.  Cassandra Romero is well-known to me.  I have been taking care of her husband for over a year for a chronic right-sided distal ureteral stricture and associated nephroureteral tube.  Cassandra Romero is his primary caregiver.  Given the location of her lesion (it extends centrally and abuts a lower pole calyx) relatively advanced age and her status is a primary caregiver, Dr. Lovena Neighbours feels that she is a relatively high risk surgical candidate.  I definitely agree with that assessment.  Currently she is asymptomatic and denies hematuria, flank paind or other systemic symptoms.  She is interested in undergoing treatment but wants as minimal a recovery as possible.   Past Medical History:  Diagnosis Date  . Atrial myxoma    s/p resection  . GERD (gastroesophageal reflux disease)   . Osteopenia   . Paroxysmal atrial fibrillation (HCC)   . Tachy-brady syndrome ([place])    s/p PPM    Past Surgical History:  Procedure Laterality Date  . APPENDECTOMY    . ATRIAL MYXOMA EXCISION  12/24/08   Left -- Dr Roxy Manns  . CHOLECYSTECTOMY    . PACEMAKER INSERTION    . PARTIAL HYSTERECTOMY      Allergies: Aspirin, Alendronate sodium, Amiodarone hcl, Ibandronate sodium, Metaxalone, Risedronate sodium, and Xarelto [rivaroxaban]  Medications: Prior to Admission medications   Medication Sig Start Date End Date Taking? Authorizing Provider  acetaminophen (TYLENOL) 325 MG tablet Take 650 mg by mouth every 6 (six) hours as needed (pain).     [provider]     Family History  Problem Relation Age of Onset  . Pancreatic cancer Mother   . Transient ischemic attack Mother        numerous  . Dementia Mother   . Thyroid disease Mother   . Heart disease Father   . Heart attack Father 33  . Pancreatic cancer Unknown   . Dementia Unknown        parathyroid removal  . Coronary artery disease Unknown     Social History   Socioeconomic History  . Marital status: Married    Spouse name: Not on file  . Number of children: Not on file  . Years of education: Not on file  . Highest education level: Not on file  Occupational History  . Occupation: retired Pharmacist, hospital  Tobacco Use  . Smoking status: Former Smoker    Quit date: 06/04/1988    Years since quitting: 32.1  . Smokeless tobacco: Never Used  Substance and Sexual Activity  . Alcohol use: Yes    Comment: maybe 1 a month  . Drug use: No  . Sexual activity: Not on file  Other Topics Concern  . Not on file  Social History Narrative   Lives in [place]   Social Determinants of Health   Financial Resource Strain: Not on file  Food Insecurity: Not on file  Transportation Needs: Not on file  Physical Activity: Not on file  Stress: Not on file  Social Connections: Not on file  Review of Systems  Review of Systems: A 12 point ROS discussed and pertinent positives are indicated in the HPI above.  All other systems are negative.  Physical Exam No direct physical exam was performed (except for noted visual exam findings with Video Visits).    Vital Signs: There were no vitals taken for this visit.  Imaging: No results found.  Labs:  CBC: No results for input(s): WBC, HGB, HCT, PLT in the last 8760 hours.  COAGS: No results for input(s): INR, APTT in the last 8760 hours.  BMP: No results for input(s): NA, K, CL, CO2, GLUCOSE, BUN, CALCIUM, CREATININE, GFRNONAA, GFRAA in the last 8760 hours.  Invalid input(s): CMP  LIVER FUNCTION TESTS: No results for  input(s): BILITOT, AST, ALT, ALKPHOS, PROT, ALBUMIN in the last 8760 hours.  TUMOR MARKERS: No results for input(s): AFPTM, CEA, CA199, CHROMGRNA in the last 8760 hours.  Assessment and Plan:  Extremely pleasant 87 year old female with a 1.7 x 1.4 x 1.5 cm enhancing mass exophyticfrom the posterior lower pole of the right kidney.  On excretory phase imaging, the mass abuts and adjacent lower pole urinary calyx.  Due to the central extension in close proximity with the calyx, there is elevated risk for urinary leak following percutaneous ablation.  I believe placement of a prophylactic double-J ureteral stent should provide adequate decompression and protection from a urinary leak.  1.)  Please coordinate with Dr. Jackson Latino office.  If he could place a double-J ureteral stent prior to percutaneous ablation, that would be very helpful.  2.)  Schedule for percutaneous cryoablation to be performed after double-J ureteral stent placement.  If ureteral stent placement will be at [facility], we could try to coordinate to perform the ablation the same day, or the following day.  Thank you for this interesting consult.  I greatly enjoyed meeting MARENE GILLIAM and look forward to participating in their care.  A copy of this report was sent to the requesting provider on this date.  Electronically Signed: Criselda Peaches 08/03/2020, 2:55 PM   I spent a total of 40 Minutes  in remote  clinical consultation, greater than 50% of which was counseling/coordinating care for right renal neoplasm.    Visit type: Audio only (telephone). Audio (no video) only due to patient preference. Alternative for in-person consultation at [facility], [address] [place], [place], [place]. This visit type was conducted due to national recommendations for restrictions regarding the COVID-19 Pandemic (e.g. social distancing).  This format is felt to be most appropriate for this patient at this time.  All  issues noted in this document were discussed and addressed.

## 2020-08-16 ENCOUNTER — Other Ambulatory Visit: Payer: Self-pay | Admitting: Urology

## 2020-08-25 ENCOUNTER — Other Ambulatory Visit (HOSPITAL_COMMUNITY): Payer: Self-pay | Admitting: Interventional Radiology

## 2020-08-25 DIAGNOSIS — N2889 Other specified disorders of kidney and ureter: Secondary | ICD-10-CM

## 2020-09-07 ENCOUNTER — Encounter: Payer: Self-pay | Admitting: Internal Medicine

## 2020-09-07 ENCOUNTER — Telehealth: Payer: Self-pay | Admitting: Student

## 2020-09-07 ENCOUNTER — Encounter (HOSPITAL_BASED_OUTPATIENT_CLINIC_OR_DEPARTMENT_OTHER): Payer: Self-pay | Admitting: Urology

## 2020-09-07 ENCOUNTER — Other Ambulatory Visit: Payer: Self-pay

## 2020-09-07 DIAGNOSIS — J069 Acute upper respiratory infection, unspecified: Secondary | ICD-10-CM

## 2020-09-07 HISTORY — DX: Acute upper respiratory infection, unspecified: J06.9

## 2020-09-07 NOTE — Progress Notes (Addendum)
Spoke w/ via phone for pre-op interview---PT Lab needs dos----   I STAT 8, EKLG            Lab results------see below COVID test ------09-12-2020 1100 Arrive at -------630 am 09-14-2020 NPO after MN NO Solid Food.  Clear liquids from MN until---530 am then npo Med rec completed Medications to take morning of surgery -----famotidine Diabetic medication -----n/a Patient instructed to bring photo id and insurance card day of surgery Patient aware to have Driver (ride ) / caregiver  Friend Tory Emerald will stay   for 24 hours after surgery  Patient Special Instructions -----none Pre-Op special Istructions -----none Patient verbalized understanding of instructions that were given at this phone interview. Patient denies shortness of breath, chest pain, fever, cough at this phone interview.  Anesthesia Review:hx sick sinus syndrome pacemaker implant st jude 2010, right atrial myxoma resection, hx afib 2010 right renal mass, , pt denies any cardiac S & S or sob, spoke with dr  Marcello Moores brock mda  and reviewed pt history with dr Marcello Moores brock mda and last pacer check 06-28-2020, pt meets [facility] guidelines barring any acute status changes per dt thoms brock mda  Addendum: device orders received and reviewed with dr Marcello Moores brock mda stating patient is overdue for in clinic check, pt ok for [facility] surgery 09-14-2020 per dr Marcello Moores brock mda.  PCP: no pcp Cardiologist : dr Ennis Forts pa 05-13-2019 epic Last device check 06-28-2020 epic Chest x-ray :none recent EKG : 05-13-2019 epic Echo :10-05-2016 epic Stress test:none Cardiac Cath : none Activity level: does own house and yard work and no trouble climbing stairs per pt Sleep Study/ CPAP :none Blood Thinner/ Instructions /Last Dose:n/a ASA / Instructions/ Last Dose :n/a

## 2020-09-07 NOTE — Telephone Encounter (Signed)
New Message:    Pt need to reschedule her 09-28-20 appt, she will not be at home.

## 2020-09-07 NOTE — Telephone Encounter (Signed)
I called the pt and left a message on her answering machine to return my call. I gave her the device clinic direct office number so we can reschedule her remote.

## 2020-09-07 NOTE — Progress Notes (Signed)
PERIOPERATIVE PRESCRIPTION FOR IMPLANTED CARDIAC DEVICE PROGRAMMING  Patient Information: Name:  Cassandra Romero  DOB:  07/30/1932  MRN:  [id number]    Planned Procedure: CYSTOSCOPY WITH RIGHT STENT PLACEMENT  Surgeon:  DR Ellison Hughs  Date of Procedure: 09-14-2020  Cautery will be used.  Position during surgery:    Please send documentation back to:  [facility] (Fax # [phone])    Device Information:  Clinic EP Physician:  Thompson Grayer, MD   Device Type:  Pacemaker Manufacturer and Phone #:  Medtronic: [id number] Pacemaker Dependent?:  Unknown, patient is overdue for in-clinic check Date of Last Device Check:  06/28/20 (Remote) Normal Device Function?:  Yes.    Electrophysiologist's Recommendations:   Have magnet available.  Provide continuous ECG monitoring when magnet is used or reprogramming is to be performed.   Procedure may interfere with device function.  Magnet should be placed over device during procedure.   Patient will need to have device checked prior to procedure due to over 1 year in-clinic check.  Per Device Clinic Standing Orders, Simone Curia, RN  12:53 PM 09/07/2020

## 2020-09-09 NOTE — Telephone Encounter (Signed)
The patient rescheduled her appointment for 10-05-2020.

## 2020-09-10 DIAGNOSIS — J302 Other seasonal allergic rhinitis: Secondary | ICD-10-CM | POA: Insufficient documentation

## 2020-09-10 HISTORY — DX: Other seasonal allergic rhinitis: J30.2

## 2020-09-12 ENCOUNTER — Other Ambulatory Visit (HOSPITAL_COMMUNITY): Payer: Medicare PPO

## 2020-09-12 ENCOUNTER — Encounter (HOSPITAL_BASED_OUTPATIENT_CLINIC_OR_DEPARTMENT_OTHER): Payer: Self-pay | Admitting: Urology

## 2020-09-12 DIAGNOSIS — Z03818 Encounter for observation for suspected exposure to other biological agents ruled out: Secondary | ICD-10-CM | POA: Diagnosis not present

## 2020-09-12 NOTE — Progress Notes (Signed)
Spoke with patient and pt stated she was told by dr winter office she could get a covid test at her md office since she has a cough with seasonal allergies.

## 2020-09-13 DIAGNOSIS — C641 Malignant neoplasm of right kidney, except renal pelvis: Secondary | ICD-10-CM | POA: Diagnosis not present

## 2020-09-13 DIAGNOSIS — J40 Bronchitis, not specified as acute or chronic: Secondary | ICD-10-CM | POA: Diagnosis not present

## 2020-09-13 DIAGNOSIS — Z6821 Body mass index (BMI) 21.0-21.9, adult: Secondary | ICD-10-CM | POA: Diagnosis not present

## 2020-09-13 DIAGNOSIS — J329 Chronic sinusitis, unspecified: Secondary | ICD-10-CM | POA: Diagnosis not present

## 2020-09-14 ENCOUNTER — Ambulatory Visit (HOSPITAL_BASED_OUTPATIENT_CLINIC_OR_DEPARTMENT_OTHER): Admission: RE | Admit: 2020-09-14 | Payer: Medicare PPO | Source: Home / Self Care | Admitting: Urology

## 2020-09-14 HISTORY — DX: Presence of spectacles and contact lenses: Z97.3

## 2020-09-14 HISTORY — DX: Presence of external hearing-aid: Z97.4

## 2020-09-14 HISTORY — DX: Other specified disorders of kidney and ureter: N28.89

## 2020-09-14 SURGERY — CYSTOSCOPY, WITH STENT INSERTION
Anesthesia: Monitor Anesthesia Care | Laterality: Right

## 2020-09-15 ENCOUNTER — Other Ambulatory Visit (HOSPITAL_COMMUNITY): Payer: Self-pay | Admitting: *Deleted

## 2020-09-19 ENCOUNTER — Other Ambulatory Visit: Payer: Self-pay | Admitting: Urology

## 2020-09-20 ENCOUNTER — Other Ambulatory Visit: Payer: Self-pay | Admitting: Radiology

## 2020-09-20 ENCOUNTER — Other Ambulatory Visit (HOSPITAL_COMMUNITY): Payer: Self-pay | Admitting: *Deleted

## 2020-09-20 NOTE — Progress Notes (Signed)
Spoke with Stacie Glaze in Radiology regarding pt has cystoscopy scheduled on 09/27/20 and IR procedure on 09/28/20.  Requested orders for IR procedure on 09/28/20 due to pt comin gin for preop on 09/21/2020 at 1100am.  Spoke with Stacie Glaze in regards instead of Covid appt on 09/23/20 being on 09/26/20 before 1100am and if Radiology would be ok with one covid appt for both 4/26 and 09/28/20 procedures.  Rowe Robert, PA is okay with one covid appt on 09/26/20 before 1100am  And cystoscopy at [facility] on 09/27/20 and IR procedure in Radiology on 09/28/20.

## 2020-09-21 ENCOUNTER — Ambulatory Visit (HOSPITAL_COMMUNITY)
Admission: RE | Admit: 2020-09-21 | Discharge: 2020-09-21 | Disposition: A | Discharge status: Home / Self Care | Payer: Medicare PPO | Source: Ambulatory Visit | Attending: Radiology | Admitting: Radiology

## 2020-09-21 ENCOUNTER — Encounter (HOSPITAL_COMMUNITY)
Admission: RE | Admit: 2020-09-21 | Discharge: 2020-09-21 | Disposition: A | Discharge status: Home / Self Care | Payer: Medicare PPO | Source: Ambulatory Visit | Attending: Interventional Radiology | Admitting: Interventional Radiology

## 2020-09-21 ENCOUNTER — Encounter (HOSPITAL_COMMUNITY): Payer: Self-pay

## 2020-09-21 ENCOUNTER — Other Ambulatory Visit: Payer: Self-pay

## 2020-09-21 DIAGNOSIS — I495 Sick sinus syndrome: Secondary | ICD-10-CM | POA: Insufficient documentation

## 2020-09-21 DIAGNOSIS — Z79899 Other long term (current) drug therapy: Secondary | ICD-10-CM | POA: Diagnosis not present

## 2020-09-21 DIAGNOSIS — Z950 Presence of cardiac pacemaker: Secondary | ICD-10-CM | POA: Insufficient documentation

## 2020-09-21 DIAGNOSIS — N2889 Other specified disorders of kidney and ureter: Secondary | ICD-10-CM | POA: Diagnosis not present

## 2020-09-21 DIAGNOSIS — I480 Paroxysmal atrial fibrillation: Secondary | ICD-10-CM | POA: Insufficient documentation

## 2020-09-21 DIAGNOSIS — Z01818 Encounter for other preprocedural examination: Secondary | ICD-10-CM

## 2020-09-21 HISTORY — DX: Chronic kidney disease, unspecified: N18.9

## 2020-09-21 LAB — CBC WITH DIFFERENTIAL/PLATELET
Abs Immature Granulocytes: 0.04 10*3/uL (ref 0.00–0.07)
Basophils Absolute: 0 10*3/uL (ref 0.0–0.1)
Basophils Relative: 0 %
Eosinophils Absolute: 0.1 10*3/uL (ref 0.0–0.5)
Eosinophils Relative: 2 %
HCT: 44.6 % (ref 36.0–46.0)
Hemoglobin: 13.6 g/dL (ref 12.0–15.0)
Immature Granulocytes: 1 %
Lymphocytes Relative: 31 %
Lymphs Abs: 2.4 10*3/uL (ref 0.7–4.0)
MCH: 28.9 pg (ref 26.0–34.0)
MCHC: 30.5 g/dL (ref 30.0–36.0)
MCV: 94.7 fL (ref 80.0–100.0)
Monocytes Absolute: 0.8 10*3/uL (ref 0.1–1.0)
Monocytes Relative: 10 %
Neutro Abs: 4.4 10*3/uL (ref 1.7–7.7)
Neutrophils Relative %: 56 %
Platelets: 341 10*3/uL (ref 150–400)
RBC: 4.71 MIL/uL (ref 3.87–5.11)
RDW: 12.9 % (ref 11.5–15.5)
WBC: 7.8 10*3/uL (ref 4.0–10.5)
nRBC: 0 % (ref 0.0–0.2)

## 2020-09-21 LAB — BASIC METABOLIC PANEL
Anion gap: 8 (ref 5–15)
BUN: 15 mg/dL (ref 8–23)
CO2: 29 mmol/L (ref 22–32)
Calcium: 9.9 mg/dL (ref 8.9–10.3)
Chloride: 105 mmol/L (ref 98–111)
Creatinine, Ser: 0.74 mg/dL (ref 0.44–1.00)
GFR, Estimated: >60 mL/min (ref >60)
Glucose, Bld: 100 mg/dL — ABNORMAL HIGH (ref 70–99)
Potassium: 4.6 mmol/L (ref 3.5–5.1)
Sodium: 142 mmol/L (ref 135–145)

## 2020-09-21 LAB — PROTIME-INR
INR: 1 (ref 0.8–1.2)
Prothrombin Time: 13.6 seconds (ref 11.4–15.2)

## 2020-09-21 NOTE — Progress Notes (Signed)
COVID Vaccine Completed: Yes Date COVID Vaccine completed: 04/04/20 COVID vaccine manufacturer:    Moderna   PCP - Dr. Bertram Millard Cardiologist - Dr. Thompson Grayer  Chest x-ray -  EKG -  Stress Test -  ECHO -  Cardiac Cath -  Pacemaker/ICD device last checked:06/28/20  Sleep Study -  CPAP -   Fasting Blood Sugar -  Checks Blood Sugar _____ times a day  Blood Thinner Instructions: Aspirin Instructions: Last Dose:  Anesthesia review: Hx: AICD,Afib  Patient denies shortness of breath, fever, cough and chest pain at PAT appointment   Patient verbalized understanding of instructions that were given to them at the PAT appointment. Patient was also instructed that they will need to review over the PAT instructions again at home before surgery.

## 2020-09-21 NOTE — Patient Instructions (Addendum)
DUE TO COVID-19 ONLY ONE VISITOR IS ALLOWED TO COME WITH YOU AND STAY IN THE WAITING ROOM ONLY DURING PRE OP AND PROCEDURE DAY OF SURGERY. THE 1 VISITOR  MAY VISIT WITH YOU AFTER SURGERY IN YOUR PRIVATE ROOM DURING VISITING HOURS ONLY!  YOU NEED TO HAVE A COVID 19 TEST ON: 09/26/20 @ 1:00 PM , THIS TEST MUST BE DONE BEFORE SURGERY,  COVID TESTING SITE [place] [id number], IT IS ON THE RIGHT GOING OUT [address] APPROXIMATELY  2 MINUTES PAST [employer] ON THE RIGHT. ONCE YOUR COVID TEST IS COMPLETED,  PLEASE BEGIN THE QUARANTINE INSTRUCTIONS AS OUTLINED IN YOUR HANDOUT.                Cassandra Romero   Your procedure is scheduled on: 09/28/20   Report to [facility] Main  Entrance   Report to admitting at: 9:45 AM     Call this number if you have problems the morning of surgery [phone]    Remember: Do not eat solid food :After Midnight. Clear liquids until: 8:45 am  CLEAR LIQUID DIET  Foods Allowed                                                                     Foods Excluded  Coffee and tea, regular and decaf                             liquids that you cannot  Plain Jell-O any favor except red or purple                                           see through such as: Fruit ices (not with fruit pulp)                                     milk, soups, orange juice  Iced Popsicles                                    All solid food Carbonated beverages, regular and diet                                    Cranberry, grape and apple juices Sports drinks like Gatorade Lightly seasoned clear broth or consume(fat free) Sugar, honey syrup  Sample Menu Breakfast                                Lunch                                     Supper Cranberry juice  Beef broth                            Chicken broth Jell-O                                     Grape juice                           Apple juice Coffee or tea                         Jell-O                                      Popsicle                                                Coffee or tea                        Coffee or tea  _____________________________________________________________________  BRUSH YOUR TEETH MORNING OF SURGERY AND RINSE YOUR MOUTH OUT, NO CHEWING GUM CANDY OR MINTS.    Take these medicines the morning of surgery with A SIP OF WATER: pepcid.                               You may not have any metal on your body including hair pins and              piercings  Do not wear jewelry, make-up, lotions, powders or perfumes, deodorant             Do not wear nail polish on your fingernails.  Do not shave  48 hours prior to surgery.    Do not bring valuables to the hospital. [place].  Contacts, dentures or bridgework may not be worn into surgery.  Leave suitcase in the car. After surgery it may be brought to your room.     Patients discharged the day of surgery will not be allowed to drive home. IF YOU ARE HAVING SURGERY AND GOING HOME THE SAME DAY, YOU MUST HAVE AN ADULT TO DRIVE YOU HOME AND BE WITH YOU FOR 24 HOURS. YOU MAY GO HOME BY TAXI OR UBER OR ORTHERWISE, BUT AN ADULT MUST ACCOMPANY YOU HOME AND STAY WITH YOU FOR 24 HOURS.  Name and phone number of your driver:  Special Instructions: N/A              Please read over the following fact sheets you were given: _____________________________________________________________________        [facility] - Preparing for Surgery Before surgery, you can play an important role.  Because skin is not sterile, your skin needs to be as free of germs as possible.  You can reduce the number of germs on your skin by washing with CHG (chlorahexidine gluconate) soap before surgery.  CHG is an antiseptic cleaner which  kills germs and bonds with the skin to continue killing germs even after washing. Please DO NOT use if you have an allergy to CHG or  antibacterial soaps.  If your skin becomes reddened/irritated stop using the CHG and inform your nurse when you arrive at Short Stay. Do not shave (including legs and underarms) for at least 48 hours prior to the first CHG shower.  You may shave your face/neck. Please follow these instructions carefully:  1.  Shower with CHG Soap the night before surgery and the  morning of Surgery.  2.  If you choose to wash your hair, wash your hair first as usual with your  normal  shampoo.  3.  After you shampoo, rinse your hair and body thoroughly to remove the  shampoo.                           4.  Use CHG as you would any other liquid soap.  You can apply chg directly  to the skin and wash                       Gently with a scrungie or clean washcloth.  5.  Apply the CHG Soap to your body ONLY FROM THE NECK DOWN.   Do not use on face/ open                           Wound or open sores. Avoid contact with eyes, ears mouth and genitals (private parts).                       Wash face,  Genitals (private parts) with your normal soap.             6.  Wash thoroughly, paying special attention to the area where your surgery  will be performed.  7.  Thoroughly rinse your body with warm water from the neck down.  8.  DO NOT shower/wash with your normal soap after using and rinsing off  the CHG Soap.                9.  Pat yourself dry with a clean towel.            10.  Wear clean pajamas.            11.  Place clean sheets on your bed the night of your first shower and do not  sleep with pets. Day of Surgery : Do not apply any lotions/deodorants the morning of surgery.  Please wear clean clothes to the hospital/surgery center.  FAILURE TO FOLLOW THESE INSTRUCTIONS MAY RESULT IN THE CANCELLATION OF YOUR SURGERY PATIENT SIGNATURE_________________________________  NURSE SIGNATURE__________________________________  ________________________________________________________________________

## 2020-09-22 ENCOUNTER — Encounter (HOSPITAL_BASED_OUTPATIENT_CLINIC_OR_DEPARTMENT_OTHER): Payer: Self-pay | Admitting: Urology

## 2020-09-22 NOTE — Progress Notes (Addendum)
Anesthesia Chart Review:   Case: [id number] Date/Time: 09/27/20 1200   Procedure: CYSTOSCOPY WITH RETROGRADE PYELOGRAM/URETERAL STENT PLACEMENT (Right ) - ONLY NEEDS 30MIN   Anesthesia type: Monitor Anesthesia Care   Pre-op diagnosis: RIGHT RENAL MASS   Location: [facility] OR ROOM 2 / [place]   Surgeons: Ceasar Mons, MD      DISCUSSION: Pt is 87 years old with hx tachy-brady syndrome, dual chamber PPM ([employer], implanted 2010; last device check 06/28/20), atrial myxoma (s/p resection), PAF, renal mass  Perioperative prescription for pacemaker:   Have magnet available.  Provide continuous ECG monitoring when magnet is used or reprogramming is to be performed.   Procedure may interfere with device function.  Magnet should be placed over device during procedure.   Patient will need to have device checked prior to procedure due to over 1 year in-clinic check. - RN to contact rep with date/time of surgery for pre-op device check.    VS: BP (!) 170/96   Pulse 76   Temp 36.7 C (Oral)   Ht 5\' 5"  (1.651 m)   Wt 59 kg   SpO2 100%   BMI 21.63 kg/m    PROVIDERS: - PCP is Mateo Flow, MD - Cardiologist is Thompson Grayer, MD. Last office visit 05/13/19 with Barrington Ellison, PA   LABS: Labs reviewed: Acceptable for surgery. (all labs ordered are listed, but only abnormal results are displayed)  Labs Reviewed  BASIC METABOLIC PANEL - Abnormal; Notable for the following components:      Result Value   Glucose, Bld 100 (*)    All other components within normal limits  CBC WITH DIFFERENTIAL/PLATELET  PROTIME-INR  TYPE AND SCREEN     IMAGES: 1 view CXR 09/21/20:  1. The appearance of the lungs is concerning for potential interstitial lung disease. Further evaluation with nonemergent high-resolution chest CT is recommended to better evaluate these findings. 2. Chronic elevation of the right hemidiaphragm. 3. Aortic atherosclerosis.   EKG 09/21/20:  Atrial-paced rhythm. Septal infarct , age undetermined   CV: Echo 06/24/19:  1. Left ventricular ejection fraction, by visual estimation, is 55 to 60%. The left ventricle has normal function. There is no left ventricular hypertrophy.  2. The left ventricle has no regional wall motion abnormalities.  3. Global right ventricle has normal systolic function.The right ventricular size is normal. No increase in right ventricular wall thickness.  4. Pacing wires in RA/RV.  5. Left atrial size was normal.  6. Right atrial size was normal.  7. The mitral valve is normal in structure. Trivial mitral valve regurgitation. No evidence of mitral stenosis.  8. The tricuspid valve is normal in structure.  9. The tricuspid valve is normal in structure. Tricuspid valve regurgitation is mild.  10. The aortic valve is normal in structure. Aortic valve regurgitation is not visualized. Mild aortic valve sclerosis without stenosis.  11. Pulmonic regurgitation is mild.  12. The pulmonic valve was grossly normal. Pulmonic valve regurgitation is mild.  13. Moderately elevated pulmonary artery systolic pressure.  14. The inferior vena cava is normal in size with greater than 50% respiratory variability, suggesting right atrial pressure of 3 mmHg.     Past Medical History:  Diagnosis Date  . AICD (automatic cardioverter/defibrillator) present 2010   [facility]  . Atrial myxoma    s/p resection  . Chronic kidney disease    kidney tumor  . GERD (gastroesophageal reflux disease)   . Osteopenia   . Paroxysmal atrial  fibrillation ([place])   . Right renal mass   . Seasonal allergies 09/10/2020   nonproductive cough  . Tachy-brady syndrome ([facility])    s/p PPM  . Wears glasses   . Wears hearing aid in both ears     Past Surgical History:  Procedure Laterality Date  . APPENDECTOMY  1954   OPEN  . ATRIAL MYXOMA EXCISION  12/24/08   Left -- Dr Roxy Manns  . BREAST SURGERY  1 ON RIGHT 2 ON LEFT   BIOPSY LAST DONE  1973, AND X 2 1960'S  . CHOLECYSTECTOMY  2010   LAPAPROSCIPIC  . IR RADIOLOGIST EVAL & MGMT  08/03/2020  . PACEMAKER INSERTION  2010  . PARTIAL HYSTERECTOMY  YRS AGO   STILL HAS OVARIES  . stapendectomy Bilateral 1970's    MEDICATIONS: . famotidine (PEPCID) 20 MG tablet   No current facility-administered medications for this encounter.    If no changes, I anticipate pt can proceed with surgery as scheduled.   Willeen Cass, PhD, FNP-BC [facility]/Anesthesiology Phone: [phone] 09/22/2020 1:19 PM

## 2020-09-22 NOTE — Anesthesia Preprocedure Evaluation (Addendum)
Anesthesia Evaluation  Patient identified by MRN, date of birth, ID band Patient awake    Reviewed: Allergy & Precautions, NPO status , Patient's Chart, lab work & pertinent test results  Airway Mallampati: III  TM Distance: >3 FB Neck ROM: Full    Dental no notable dental hx. (+) Teeth Intact, Dental Advisory Given, Chipped,    Pulmonary former smoker,  Quit smoking 1990, light smoker URI earlier this month, currently has some allergies    Pulmonary exam normal breath sounds clear to auscultation       Cardiovascular hypertension (BP 174/93 in preop, has not seen cardiology in a few years but has seen PCP and per pt her BP is not normally this high), Normal cardiovascular exam+ pacemaker (inserted 2010 for sick sinus after removal of atrial myxoma)  Rhythm:Regular Rate:Normal  Hx atrial myxoma removed about 10 years ago, pp, inserted at the time    Neuro/Psych negative neurological ROS  negative psych ROS   GI/Hepatic Neg liver ROS, GERD  Controlled and Medicated,  Endo/Other  negative endocrine ROS  Renal/GU Renal disease (right renal mess)  negative genitourinary   Musculoskeletal negative musculoskeletal ROS (+)   Abdominal   Peds negative pediatric ROS (+)  Hematology negative hematology ROS (+)   Anesthesia Other Findings   Reproductive/Obstetrics negative OB ROS                            Anesthesia Physical Anesthesia Plan  ASA: III  Anesthesia Plan: General   Post-op Pain Management:    Induction: Intravenous  PONV Risk Score and Plan: 3 and Ondansetron, Dexamethasone, Midazolam and Treatment may vary due to age or medical condition  Airway Management Planned: LMA  Additional Equipment: None  Intra-op Plan:   Post-operative Plan: Extubation in OR  Informed Consent: I have reviewed the patients History and Physical, chart, labs and discussed the procedure including the  risks, benefits and alternatives for the proposed anesthesia with the patient or authorized representative who has indicated his/her understanding and acceptance.     Dental advisory given  Plan Discussed with: CRNA  Anesthesia Plan Comments:        Anesthesia Quick Evaluation

## 2020-09-22 NOTE — Progress Notes (Addendum)
Spoke w/ via phone for pre-op interview---PT Lab needs dos----  09-23-2020 CBC WITH DIF, CMP, PT,  T & S IN EPIC            Lab results------see below COVID test ------09-12-2020 1100 Arrive at -------1015 am 09-27-2020 NPO after MN NO Solid Food.  Clear liquids from MN until---915 am then npo Med rec completed Medications to take morning of surgery -----famotidine Diabetic medication -----n/a Patient instructed to bring photo id and insurance card day of surgery Patient aware to have Driver (ride ) / caregiver  Friend Tory Emerald will stay   for 24 hours after surgery  Patient Special Instructions -----none Pre-Op special Istructions -----none Patient verbalized understanding of instructions that were given at this phone interview. Patient denies shortness of breath, chest pain, fever, cough at this phone interview.  Anesthesia Review:hx sick sinus syndrome pacemaker implant st [facility] 2010, right atrial myxoma resection, hx afib 2010 right renal mass, , pt denies any cardiac S & S or sob, spoke with dr  Marcello Moores brock mda  and reviewed pt history with dr Marcello Moores brock mda and last pacer check 06-28-2020, pt meets [facility] guidelines barring any acute status changes per dt thoms brock mda  Addendum: device orders received and reviewed with dr Marcello Moores brock mda stating patient is overdue for in clinic check, pt ok for [facility] surgery 09-14-2020 (SURGERY RESCHEDULED TO 09-27-2020 DUE TO PATIENT HAD URI WITH COLD) per dr Marcello Moores brock mda.  PCP: no pcp Cardiologist : dr Ennis Forts pa 05-13-2019 epic Last device check 06-28-2020 epic Chest x-ray :none recent EKG : 4- 20-2022 epic Echo :10-05-2016 epic Stress test:none Cardiac Cath : none Activity level: does own house and yard work and no trouble climbing stairs per pt Sleep Study/ CPAP :none Blood Thinner/ Instructions /Last Dose:n/a ASA / Instructions/ Last Dose :n/a

## 2020-09-22 NOTE — Progress Notes (Addendum)
[employer] rep Nash Mantis) was page as per Hilbert Bible PA-C request.When rep. called back,he was informed by RN about Angela's request to have [employer] rep check the device before surgery.

## 2020-09-22 NOTE — Progress Notes (Addendum)
Legrand Como Clarke,[employer] rep. was contacted again to explained, that it is as per the cardiologist note, that said that the device needs to be check before surgery by the rep.Legrand Como said that it will be take care of before the surgery.

## 2020-09-23 ENCOUNTER — Other Ambulatory Visit (HOSPITAL_COMMUNITY): Payer: Medicare PPO

## 2020-09-26 ENCOUNTER — Other Ambulatory Visit (HOSPITAL_COMMUNITY)
Admission: RE | Admit: 2020-09-26 | Discharge: 2020-09-26 | Disposition: A | Discharge status: Home / Self Care | Payer: Medicare PPO | Source: Ambulatory Visit | Attending: Interventional Radiology | Admitting: Interventional Radiology

## 2020-09-26 ENCOUNTER — Other Ambulatory Visit (HOSPITAL_COMMUNITY): Payer: Medicare PPO

## 2020-09-26 DIAGNOSIS — Z01812 Encounter for preprocedural laboratory examination: Secondary | ICD-10-CM | POA: Diagnosis not present

## 2020-09-26 DIAGNOSIS — Z20822 Contact with and (suspected) exposure to covid-19: Secondary | ICD-10-CM | POA: Insufficient documentation

## 2020-09-27 ENCOUNTER — Other Ambulatory Visit: Payer: Self-pay | Admitting: Radiology

## 2020-09-27 ENCOUNTER — Encounter (HOSPITAL_BASED_OUTPATIENT_CLINIC_OR_DEPARTMENT_OTHER)
Admission: RE | Disposition: A | Discharge status: Home / Self Care | Payer: Self-pay | Source: Home / Self Care | Attending: Urology

## 2020-09-27 ENCOUNTER — Ambulatory Visit (HOSPITAL_BASED_OUTPATIENT_CLINIC_OR_DEPARTMENT_OTHER): Payer: Medicare PPO | Admitting: Anesthesiology

## 2020-09-27 ENCOUNTER — Ambulatory Visit (HOSPITAL_BASED_OUTPATIENT_CLINIC_OR_DEPARTMENT_OTHER): Payer: Medicare PPO | Admitting: Emergency Medicine

## 2020-09-27 ENCOUNTER — Ambulatory Visit (HOSPITAL_BASED_OUTPATIENT_CLINIC_OR_DEPARTMENT_OTHER)
Admission: RE | Admit: 2020-09-27 | Discharge: 2020-09-27 | Disposition: A | Discharge status: Home / Self Care | Payer: Medicare PPO | Attending: Urology | Admitting: Urology

## 2020-09-27 ENCOUNTER — Other Ambulatory Visit: Payer: Self-pay

## 2020-09-27 ENCOUNTER — Encounter (HOSPITAL_BASED_OUTPATIENT_CLINIC_OR_DEPARTMENT_OTHER): Payer: Self-pay | Admitting: Urology

## 2020-09-27 DIAGNOSIS — Z95810 Presence of automatic (implantable) cardiac defibrillator: Secondary | ICD-10-CM | POA: Diagnosis not present

## 2020-09-27 DIAGNOSIS — R634 Abnormal weight loss: Secondary | ICD-10-CM | POA: Diagnosis not present

## 2020-09-27 DIAGNOSIS — Z800 Family history of malignant neoplasm of digestive organs: Secondary | ICD-10-CM | POA: Diagnosis not present

## 2020-09-27 DIAGNOSIS — Z87891 Personal history of nicotine dependence: Secondary | ICD-10-CM | POA: Insufficient documentation

## 2020-09-27 DIAGNOSIS — Z6821 Body mass index (BMI) 21.0-21.9, adult: Secondary | ICD-10-CM | POA: Insufficient documentation

## 2020-09-27 DIAGNOSIS — Z888 Allergy status to other drugs, medicaments and biological substances status: Secondary | ICD-10-CM | POA: Insufficient documentation

## 2020-09-27 DIAGNOSIS — N2889 Other specified disorders of kidney and ureter: Secondary | ICD-10-CM | POA: Diagnosis not present

## 2020-09-27 DIAGNOSIS — I480 Paroxysmal atrial fibrillation: Secondary | ICD-10-CM | POA: Diagnosis not present

## 2020-09-27 DIAGNOSIS — Z886 Allergy status to analgesic agent status: Secondary | ICD-10-CM | POA: Diagnosis not present

## 2020-09-27 DIAGNOSIS — I10 Essential (primary) hypertension: Secondary | ICD-10-CM | POA: Diagnosis not present

## 2020-09-27 DIAGNOSIS — I495 Sick sinus syndrome: Secondary | ICD-10-CM | POA: Diagnosis not present

## 2020-09-27 HISTORY — PX: CYSTOSCOPY W/ URETERAL STENT PLACEMENT: SHX1429

## 2020-09-27 LAB — SARS CORONAVIRUS 2 (TAT 6-24 HRS): SARS Coronavirus 2: NEGATIVE

## 2020-09-27 LAB — PROTIME-INR
INR: 1 (ref 0.8–1.2)
Prothrombin Time: 13 seconds (ref 11.4–15.2)

## 2020-09-27 SURGERY — CYSTOSCOPY, WITH RETROGRADE PYELOGRAM AND URETERAL STENT INSERTION
Anesthesia: General | Site: Urethra | Laterality: Right

## 2020-09-27 MED ORDER — TRAMADOL HCL 50 MG PO TABS: 50.0000 mg | ORAL_TABLET | Freq: Four times a day (QID) | ORAL | 0 refills | Status: AC | PRN

## 2020-09-27 MED ORDER — CEFAZOLIN SODIUM-DEXTROSE 2-4 GM/100ML-% IV SOLN
2.0000 g | Freq: Once | INTRAVENOUS | Status: AC
  Administered 2020-09-27: 2 g via INTRAVENOUS

## 2020-09-27 MED ORDER — DEXAMETHASONE SODIUM PHOSPHATE 10 MG/ML IJ SOLN
INTRAMUSCULAR | Status: DC | PRN
  Administered 2020-09-27: 4 mg via INTRAVENOUS

## 2020-09-27 MED ORDER — CEFAZOLIN SODIUM-DEXTROSE 2-4 GM/100ML-% IV SOLN
INTRAVENOUS | Status: AC
  Filled 2020-09-27: qty 100

## 2020-09-27 MED ORDER — PHENYLEPHRINE 40 MCG/ML (10ML) SYRINGE FOR IV PUSH (FOR BLOOD PRESSURE SUPPORT)
PREFILLED_SYRINGE | INTRAVENOUS | Status: DC | PRN
  Administered 2020-09-27: 80 ug via INTRAVENOUS

## 2020-09-27 MED ORDER — OXYCODONE HCL 5 MG/5ML PO SOLN: 5.0000 mg | Freq: Once | ORAL | Status: DC | PRN

## 2020-09-27 MED ORDER — DEXAMETHASONE SODIUM PHOSPHATE 10 MG/ML IJ SOLN
INTRAMUSCULAR | Status: AC
  Filled 2020-09-27: qty 1

## 2020-09-27 MED ORDER — FENTANYL CITRATE (PF) 100 MCG/2ML IJ SOLN
INTRAMUSCULAR | Status: AC
  Filled 2020-09-27: qty 2

## 2020-09-27 MED ORDER — PHENYLEPHRINE 40 MCG/ML (10ML) SYRINGE FOR IV PUSH (FOR BLOOD PRESSURE SUPPORT)
PREFILLED_SYRINGE | INTRAVENOUS | Status: AC
  Filled 2020-09-27: qty 10

## 2020-09-27 MED ORDER — ONDANSETRON HCL 4 MG/2ML IJ SOLN: 4.0000 mg | Freq: Once | INTRAMUSCULAR | Status: DC | PRN

## 2020-09-27 MED ORDER — SODIUM CHLORIDE 0.9 % IR SOLN
Status: DC | PRN
  Administered 2020-09-27: 3000 mL

## 2020-09-27 MED ORDER — ACETAMINOPHEN 500 MG PO TABS
1000.0000 mg | ORAL_TABLET | Freq: Once | ORAL | Status: AC
  Administered 2020-09-27: 1000 mg via ORAL

## 2020-09-27 MED ORDER — ONDANSETRON HCL 4 MG/2ML IJ SOLN
INTRAMUSCULAR | Status: AC
  Filled 2020-09-27: qty 2

## 2020-09-27 MED ORDER — PROPOFOL 10 MG/ML IV BOLUS
INTRAVENOUS | Status: AC
  Filled 2020-09-27: qty 20

## 2020-09-27 MED ORDER — PROPOFOL 10 MG/ML IV BOLUS
INTRAVENOUS | Status: DC | PRN
  Administered 2020-09-27: 120 mg via INTRAVENOUS

## 2020-09-27 MED ORDER — OXYCODONE HCL 5 MG PO TABS
5.0000 mg | ORAL_TABLET | Freq: Once | ORAL | Status: DC | PRN
Start: 2020-09-27 — End: 2020-09-27

## 2020-09-27 MED ORDER — FENTANYL CITRATE (PF) 100 MCG/2ML IJ SOLN
INTRAMUSCULAR | Status: DC | PRN
  Administered 2020-09-27: 50 ug via INTRAVENOUS

## 2020-09-27 MED ORDER — LIDOCAINE 2% (20 MG/ML) 5 ML SYRINGE
INTRAMUSCULAR | Status: DC | PRN
  Administered 2020-09-27: 50 mg via INTRAVENOUS

## 2020-09-27 MED ORDER — IOHEXOL 300 MG/ML  SOLN
INTRAMUSCULAR | Status: DC | PRN
  Administered 2020-09-27: 10 mL via URETHRAL

## 2020-09-27 MED ORDER — ONDANSETRON HCL 4 MG/2ML IJ SOLN
INTRAMUSCULAR | Status: DC | PRN
  Administered 2020-09-27: 4 mg via INTRAVENOUS

## 2020-09-27 MED ORDER — LIDOCAINE 2% (20 MG/ML) 5 ML SYRINGE
INTRAMUSCULAR | Status: AC
  Filled 2020-09-27: qty 5

## 2020-09-27 MED ORDER — ARTIFICIAL TEARS OPHTHALMIC OINT
TOPICAL_OINTMENT | OPHTHALMIC | Status: AC
  Filled 2020-09-27: qty 3.5

## 2020-09-27 MED ORDER — LACTATED RINGERS IV SOLN: INTRAVENOUS | Status: DC

## 2020-09-27 MED ORDER — ACETAMINOPHEN 500 MG PO TABS
ORAL_TABLET | ORAL | Status: AC
  Filled 2020-09-27: qty 2

## 2020-09-27 MED ORDER — FENTANYL CITRATE (PF) 100 MCG/2ML IJ SOLN: 25.0000 ug | INTRAMUSCULAR | Status: DC | PRN

## 2020-09-27 SURGICAL SUPPLY — 19 items
BAG DRAIN URO-CYSTO SKYTR STRL (DRAIN) ×2 IMPLANT
BAG DRN UROCATH (DRAIN) ×1
BASKET ZERO TIP NITINOL 2.4FR (BASKET) ×2 IMPLANT
BSKT STON RTRVL ZERO TP 2.4FR (BASKET) ×1
CATH URET 5FR 28IN OPEN ENDED (CATHETERS) ×2 IMPLANT
CLOTH BEACON ORANGE TIMEOUT ST (SAFETY) ×2 IMPLANT
GLOVE SURG ENC MOIS LTX SZ7.5 (GLOVE) ×2 IMPLANT
GLOVE SURG UNDER POLY LF SZ7.5 (GLOVE) ×4 IMPLANT
GOWN STRL REUS W/TWL LRG LVL3 (GOWN DISPOSABLE) ×2 IMPLANT
GOWN STRL REUS W/TWL XL LVL3 (GOWN DISPOSABLE) ×2 IMPLANT
GUIDEWIRE ZIPWRE .038 STRAIGHT (WIRE) ×2 IMPLANT
IV NS IRRIG 3000ML ARTHROMATIC (IV SOLUTION) ×2 IMPLANT
KIT TURNOVER CYSTO (KITS) ×2 IMPLANT
MANIFOLD NEPTUNE II (INSTRUMENTS) ×2 IMPLANT
NS IRRIG 500ML POUR BTL (IV SOLUTION) ×2 IMPLANT
PACK CYSTO (CUSTOM PROCEDURE TRAY) ×2 IMPLANT
STENT URET 6FRX24 CONTOUR (STENTS) ×2 IMPLANT
SYR 10ML LL (SYRINGE) ×2 IMPLANT
TUBE CONNECTING 12X1/4 (SUCTIONS) ×2 IMPLANT

## 2020-09-27 NOTE — Op Note (Signed)
Operative Note  Preoperative diagnosis:  1.  1.7 cm solid and enhancing posterior right renal mass with features concerning for renal cell carcinoma   Postoperative diagnosis: 1.  1.7 cm solid and enhancing posterior right renal mass with features concerning for renal cell carcinoma   Procedure(s): 1.  Cystoscopy with right JJ stent placement 2.  Right retrograde pyelogram with intraoperative interpretation fluoroscopic imaging  Surgeon: Ellison Hughs, MD  Assistants:  None  Anesthesia:  General  Complications:  None  EBL: Less than 5 mL  Specimens: 1.  None  Drains/Catheters: 1.  Right 6 French, 24 cm JJ stent without tether  Intraoperative findings:   1. Solitary right collecting system with no filling defects or dilation involving the right ureter or right renal pelvis seen on retrograde pyelogram   Indication:  Cassandra Romero is a 87 y.o. female with a solid and enhancing 1.7 cm posterior right renal mass with features concerning for renal cell carcinoma.  She has a complex surgical history and has elected to proceed with cryoablation of her right renal lesion.  Due to the close proximity of the lesion to the right collecting system, she will need a ureteral stent placed to help with heat seeing and prevent collecting system damage during cryoablation.  She has been consented for the above procedures, voices understanding and wishes to proceed.  Description of procedure:  After informed consent was obtained, the patient was brought to the operating room and general LMA anesthesia was administered. The patient was then placed in the dorsolithotomy position and prepped and draped in the usual sterile fashion. A timeout was performed. A 23 French rigid cystoscope was then inserted into the urethral meatus and advanced into the bladder under direct vision. A complete bladder survey revealed no intravesical pathology.  A 5 French ureteral catheter was then inserted into the  right ureteral orifice and a retrograde pyelogram was obtained, with the findings listed above.  A Glidewire was then used to intubate the lumen of the ureteral catheter and was advanced up to the right renal pelvis, under fluoroscopic guidance.  The catheter was then removed, leaving the wire in place.  A 6 French, 24 cm JJ stent was then advanced over the wire and into position within the right collecting system, confirming placement via fluoroscopy.  The patient's bladder was drained.  She tolerated the procedure well and was transferred to the postanesthesia in stable condition.  Plan: Proceed with cryoablation tomorrow with Dr. Laurence Ferrari.  Follow-up on 10/14/2020 for office cystoscopy and stent removal.

## 2020-09-27 NOTE — Anesthesia Procedure Notes (Signed)
Procedure Name: LMA Insertion Date/Time: 09/27/2020 11:43 AM Performed by: Mechele Claude, CRNA Pre-anesthesia Checklist: Patient identified, Emergency Drugs available, Suction available and Patient being monitored Patient Re-evaluated:Patient Re-evaluated prior to induction Oxygen Delivery Method: Circle system utilized Preoxygenation: Pre-oxygenation with 100% oxygen Induction Type: IV induction Ventilation: Mask ventilation without difficulty LMA: LMA inserted LMA Size: 4.0 Number of attempts: 1 Airway Equipment and Method: Bite block Placement Confirmation: positive ETCO2 Tube secured with: Tape Dental Injury: Teeth and Oropharynx as per pre-operative assessment

## 2020-09-27 NOTE — Anesthesia Postprocedure Evaluation (Signed)
Anesthesia Post Note  Patient: Cassandra Romero  Procedure(s) Performed: CYSTOSCOPY WITH RETROGRADE PYELOGRAM/URETERAL STENT PLACEMENT (Right Urethra)     Patient location during evaluation: PACU Anesthesia Type: General Level of consciousness: awake and alert, oriented and patient cooperative Pain management: pain level controlled Vital Signs Assessment: post-procedure vital signs reviewed and stable Respiratory status: spontaneous breathing, nonlabored ventilation and respiratory function stable Cardiovascular status: blood pressure returned to baseline and stable Postop Assessment: no apparent nausea or vomiting Anesthetic complications: no   No complications documented.  Last Vitals:  Vitals:   09/27/20 1245 09/27/20 1300  BP: 138/79 138/85  Pulse: 71 72  Resp: 16 (!) 21  Temp:    SpO2: 98% 98%    Last Pain:  Vitals:   09/27/20 1245  TempSrc:   PainSc: 0-No pain                 Pervis Hocking

## 2020-09-27 NOTE — Transfer of Care (Signed)
Immediate Anesthesia Transfer of Care Note  Patient: Cassandra Romero  Procedure(s) Performed: Procedure(s) (LRB): CYSTOSCOPY WITH RETROGRADE PYELOGRAM/URETERAL STENT PLACEMENT (Right)  Patient Location: PACU  Anesthesia Type: General  Level of Consciousness: awake, alert  and oriented  Airway & Oxygen Therapy: Patient Spontanous Breathing and Patient connected to face mask oxygen  Post-op Assessment: Report given to PACU RN and Post -op Vital signs reviewed and stable  Post vital signs: Reviewed and stable  Complications: No apparent anesthesia complications  Last Vitals:  Vitals Value Taken Time  BP 138/79 09/27/20 1245  Temp 36.3 C 09/27/20 1215  Pulse 70 09/27/20 1249  Resp 13 09/27/20 1249  SpO2 98 % 09/27/20 1249  Vitals shown include unvalidated device data.  Last Pain:  Vitals:   09/27/20 1215  TempSrc:   PainSc: 0-No pain         Complications: No complications documented.

## 2020-09-27 NOTE — H&P (Signed)
Urology Preoperative H&P   Chief Complaint: Right renal mass  History of Present Illness: Cassandra Romero is a 87 y.o. female who was found to have a 1.7 cm solid and enhancing posterior right renal mass with features concerning for renal cell carcinoma on CT from 06/28/2020. Her CT was initially ordered due to ongoing abdominal pain and recent unintentional weight loss of approximately 20 pounds. She has no personal/family history of GU malignancies. She denies flank pain, dysuria or prior episodes of gross hematuria. She is the primary caretaker for her husband who suffers from dementia along with several other medical comorbidities.   The patient has elected to proceed with cryotherapy as primary treatment of her right renal mass.  Due to the proximity of the mass to her collecting system, she will need a ureteral stent to prevent a heat sink effect and prevent any damage to the collecting system.      Past Medical History:  Diagnosis Date  . AICD (automatic cardioverter/defibrillator) present 2010   ST JUDE  . Atrial myxoma    s/p resection  . Chronic kidney disease    kidney tumor  . GERD (gastroesophageal reflux disease)   . Osteopenia   . Paroxysmal atrial fibrillation (HCC)   . Right renal mass   . Seasonal allergies 09/10/2020   nonproductive cough  . Tachy-brady syndrome (HCC)    s/p PPM  . URI (upper respiratory infection) 09/07/2020   WITH COLD AND COUGH , TOOK 5 DAYS OF ANTIBIOTICS ALL ISSUES RESOLVED PER PT  . Wears glasses   . Wears hearing aid in both ears     Past Surgical History:  Procedure Laterality Date  . APPENDECTOMY  1954   OPEN  . ATRIAL MYXOMA EXCISION  12/24/08   Left -- Dr Roxy Manns  . BREAST SURGERY  1 ON RIGHT 2 ON LEFT   BIOPSY LAST DONE 1973, AND X 2 1960'S  . CHOLECYSTECTOMY  2010   LAPAPROSCIPIC  . IR RADIOLOGIST EVAL & MGMT  08/03/2020  . PACEMAKER INSERTION  2010  . PARTIAL HYSTERECTOMY  YRS AGO   STILL HAS OVARIES  . stapendectomy Bilateral  1970's    Allergies:  Allergies  Allergen Reactions  . Aspirin     Makes her feel sick  . Alendronate Sodium     REACTION: extreme fatigue/vision change  . Amiodarone Hcl Nausea And Vomiting  . Ibandronate Sodium     REACTION: extreme fatigue/vision changes  . Risedronate Sodium     REACTION: extreme fatigue/vision  . Xarelto [Rivaroxaban]     SEVERE ANEMIA  . Metaxalone Rash    Family History  Problem Relation Age of Onset  . Pancreatic cancer Mother   . Transient ischemic attack Mother        numerous  . Dementia Mother   . Thyroid disease Mother   . Heart disease Father   . Heart attack Father 12  . Pancreatic cancer Other   . Dementia Other        parathyroid removal  . Coronary artery disease Other     Social History:  reports that she quit smoking about 32 years ago. Her smoking use included cigarettes. She has a 3.00 pack-year smoking history. She has never used smokeless tobacco. She reports previous alcohol use. She reports that she does not use drugs.  ROS: A complete review of systems was performed.  All systems are negative except for pertinent findings as noted.  Physical Exam:  Vital signs in last  24 hours: Temp:  [96.8 F (36 C)] 96.8 F (36 C) (04/26 1025) Pulse Rate:  [86] 86 (04/26 1025) Resp:  [16] 16 (04/26 1025) BP: (174)/(93) 174/93 (04/26 1025) SpO2:  [99 %] 99 % (04/26 1025) Weight:  [59.9 kg] 59.9 kg (04/26 1025) Constitutional:  Alert and oriented, No acute distress Cardiovascular: Regular rate and rhythm, No JVD Respiratory: Normal respiratory effort, Lungs clear bilaterally GI: Abdomen is soft, nontender, nondistended, no abdominal masses GU: No CVA tenderness Lymphatic: No lymphadenopathy Neurologic: Grossly intact, no focal deficits Psychiatric: Normal mood and affect  Laboratory Data:  No results for input(s): WBC, HGB, HCT, PLT in the last 72 hours.  No results for input(s): NA, K, CL, GLUCOSE, BUN, CALCIUM, CREATININE in  the last 72 hours.  Invalid input(s): CO3   No results found for this or any previous visit (from the past 24 hour(s)). Recent Results (from the past 240 hour(s))  SARS CORONAVIRUS 2 (TAT 6-24 HRS) Nasopharyngeal Nasopharyngeal Swab     Status: None   Collection Time: 09/26/20  9:55 AM   Specimen: Nasopharyngeal Swab  Result Value Ref Range Status   SARS Coronavirus 2 NEGATIVE NEGATIVE Final    Comment: (NOTE) SARS-CoV-2 target nucleic acids are NOT DETECTED.  The SARS-CoV-2 RNA is generally detectable in upper and lower respiratory specimens during the acute phase of infection. Negative results do not preclude SARS-CoV-2 infection, do not rule out co-infections with other pathogens, and should not be used as the sole basis for treatment or other patient management decisions. Negative results must be combined with clinical observations, patient history, and epidemiological information. The expected result is Negative.  Fact Sheet for Patients: SugarRoll.be  Fact Sheet for Healthcare Providers: https://www.woods-mathews.com/  This test is not yet approved or cleared by the [place] FDA and  has been authorized for detection and/or diagnosis of SARS-CoV-2 by FDA under an Emergency Use Authorization (EUA). This EUA will remain  in effect (meaning this test can be used) for the duration of the COVID-19 declaration under Se ction 564(b)(1) of the Act, 21 U.S.C. section 360bbb-3(b)(1), unless the authorization is terminated or revoked sooner.  Performed at [facility], [place] [address] [place], [place] [address]     Renal Function: Recent Labs    09/21/20 1201  CREATININE 0.74   Estimated Creatinine Clearance: 45.4 mL/min (by C-G formula based on SCr of 0.74 mg/dL).  Radiologic Imaging: No results found.  I independently reviewed the above imaging studies.  Assessment and Plan Cassandra Romero is a 87 y.o. female with  a solid and enhancing right renal mass with features concerning for renal cell carcinoma.  She is scheduled to undergo cryoablation with Dr. Laurence Ferrari as primary treatment of the mass.  Due to the proximity of the mass to her collecting system, she will need a ureteral stent to prevent a heat sink effect and prevent any damage to the collecting system.   -The risks, benefits and alternatives of cystoscopy with RIGHT JJ stent placement was discussed with the patient.  Risks include, but are not limited to: bleeding, urinary tract infection, ureteral injury, ureteral stricture disease, chronic pain, urinary symptoms, bladder injury, stent migration, the need for nephrostomy tube placement, MI, CVA, DVT, PE and the inherent risks with general anesthesia.  The patient voices understanding and wishes to proceed.    Ellison Hughs, MD 09/27/2020, 10:59 AM  [facility] Pager: [phone]

## 2020-09-27 NOTE — Discharge Instructions (Signed)

## 2020-09-28 ENCOUNTER — Other Ambulatory Visit: Payer: Self-pay

## 2020-09-28 ENCOUNTER — Observation Stay (HOSPITAL_COMMUNITY)
Admission: RE | Admit: 2020-09-28 | Discharge: 2020-09-29 | Disposition: A | Discharge status: Home / Self Care | Payer: Medicare PPO | Attending: Interventional Radiology | Admitting: Interventional Radiology

## 2020-09-28 ENCOUNTER — Encounter (HOSPITAL_COMMUNITY)
Admission: RE | Disposition: A | Discharge status: Home / Self Care | Payer: Self-pay | Source: Home / Self Care | Attending: Interventional Radiology

## 2020-09-28 ENCOUNTER — Ambulatory Visit (HOSPITAL_COMMUNITY)
Admission: RE | Admit: 2020-09-28 | Discharge: 2020-09-28 | Disposition: A | Discharge status: Home / Self Care | Payer: Medicare PPO | Source: Ambulatory Visit | Attending: Interventional Radiology | Admitting: Interventional Radiology

## 2020-09-28 ENCOUNTER — Encounter (HOSPITAL_COMMUNITY): Payer: Self-pay

## 2020-09-28 ENCOUNTER — Ambulatory Visit (HOSPITAL_COMMUNITY): Payer: Medicare PPO | Admitting: Anesthesiology

## 2020-09-28 DIAGNOSIS — N2889 Other specified disorders of kidney and ureter: Secondary | ICD-10-CM | POA: Diagnosis not present

## 2020-09-28 DIAGNOSIS — I480 Paroxysmal atrial fibrillation: Secondary | ICD-10-CM | POA: Diagnosis not present

## 2020-09-28 DIAGNOSIS — Z950 Presence of cardiac pacemaker: Secondary | ICD-10-CM | POA: Diagnosis not present

## 2020-09-28 DIAGNOSIS — C641 Malignant neoplasm of right kidney, except renal pelvis: Secondary | ICD-10-CM | POA: Diagnosis not present

## 2020-09-28 DIAGNOSIS — K219 Gastro-esophageal reflux disease without esophagitis: Secondary | ICD-10-CM | POA: Diagnosis not present

## 2020-09-28 DIAGNOSIS — D3001 Benign neoplasm of right kidney: Secondary | ICD-10-CM

## 2020-09-28 DIAGNOSIS — N189 Chronic kidney disease, unspecified: Secondary | ICD-10-CM | POA: Diagnosis not present

## 2020-09-28 DIAGNOSIS — I129 Hypertensive chronic kidney disease with stage 1 through stage 4 chronic kidney disease, or unspecified chronic kidney disease: Secondary | ICD-10-CM | POA: Diagnosis not present

## 2020-09-28 DIAGNOSIS — D49511 Neoplasm of unspecified behavior of right kidney: Secondary | ICD-10-CM | POA: Diagnosis not present

## 2020-09-28 HISTORY — PX: RADIOLOGY WITH ANESTHESIA: SHX6223

## 2020-09-28 HISTORY — DX: Benign neoplasm of right kidney: D30.01

## 2020-09-28 LAB — TYPE AND SCREEN
ABO/RH(D): A POS
Antibody Screen: NEGATIVE

## 2020-09-28 SURGERY — IR WITH ANESTHESIA
Anesthesia: General | Laterality: Right

## 2020-09-28 MED ORDER — ACETAMINOPHEN 500 MG PO TABS: 500.0000 mg | ORAL_TABLET | Freq: Four times a day (QID) | ORAL | Status: DC | PRN

## 2020-09-28 MED ORDER — PHENYLEPHRINE 40 MCG/ML (10ML) SYRINGE FOR IV PUSH (FOR BLOOD PRESSURE SUPPORT)
PREFILLED_SYRINGE | INTRAVENOUS | Status: DC | PRN
  Administered 2020-09-28 (×2): 80 ug via INTRAVENOUS
  Administered 2020-09-28: 120 ug via INTRAVENOUS

## 2020-09-28 MED ORDER — SUGAMMADEX SODIUM 200 MG/2ML IV SOLN
INTRAVENOUS | Status: DC | PRN
  Administered 2020-09-28: 200 mg via INTRAVENOUS

## 2020-09-28 MED ORDER — PHENYLEPHRINE HCL-NACL 10-0.9 MG/250ML-% IV SOLN
INTRAVENOUS | Status: DC | PRN
  Administered 2020-09-28: 50 ug/min via INTRAVENOUS

## 2020-09-28 MED ORDER — EPHEDRINE SULFATE-NACL 50-0.9 MG/10ML-% IV SOSY
PREFILLED_SYRINGE | INTRAVENOUS | Status: DC | PRN
  Administered 2020-09-28: 10 mg via INTRAVENOUS
  Administered 2020-09-28: 5 mg via INTRAVENOUS

## 2020-09-28 MED ORDER — TRAMADOL HCL 50 MG PO TABS
50.0000 mg | ORAL_TABLET | Freq: Four times a day (QID) | ORAL | Status: DC | PRN
  Administered 2020-09-28: 50 mg via ORAL
  Filled 2020-09-28: qty 1

## 2020-09-28 MED ORDER — FENTANYL CITRATE (PF) 250 MCG/5ML IJ SOLN
INTRAMUSCULAR | Status: DC | PRN
  Administered 2020-09-28: 50 ug via INTRAVENOUS
  Administered 2020-09-28: 100 ug via INTRAVENOUS

## 2020-09-28 MED ORDER — FAMOTIDINE 20 MG PO TABS: 20.0000 mg | ORAL_TABLET | Freq: Every day | ORAL | Status: DC | PRN

## 2020-09-28 MED ORDER — DEXAMETHASONE SODIUM PHOSPHATE 10 MG/ML IJ SOLN
INTRAMUSCULAR | Status: DC | PRN
  Administered 2020-09-28: 5 mg via INTRAVENOUS

## 2020-09-28 MED ORDER — ONDANSETRON HCL 4 MG/2ML IJ SOLN: 4.0000 mg | Freq: Once | INTRAMUSCULAR | Status: DC | PRN

## 2020-09-28 MED ORDER — LIDOCAINE 2% (20 MG/ML) 5 ML SYRINGE
INTRAMUSCULAR | Status: DC | PRN
  Administered 2020-09-28: 60 mg via INTRAVENOUS

## 2020-09-28 MED ORDER — LACTATED RINGERS IV SOLN: INTRAVENOUS | Status: DC

## 2020-09-28 MED ORDER — FENTANYL CITRATE (PF) 250 MCG/5ML IJ SOLN
INTRAMUSCULAR | Status: AC
  Filled 2020-09-28: qty 5

## 2020-09-28 MED ORDER — IOHEXOL 300 MG/ML  SOLN
100.0000 mL | Freq: Once | INTRAMUSCULAR | Status: AC | PRN
  Administered 2020-09-28: 75 mL via INTRAVENOUS

## 2020-09-28 MED ORDER — ACETAMINOPHEN 10 MG/ML IV SOLN: 1000.0000 mg | Freq: Once | INTRAVENOUS | Status: DC | PRN

## 2020-09-28 MED ORDER — DOCUSATE SODIUM 100 MG PO CAPS
100.0000 mg | ORAL_CAPSULE | Freq: Two times a day (BID) | ORAL | Status: DC
  Administered 2020-09-28 – 2020-09-29 (×2): 100 mg via ORAL
  Filled 2020-09-28 (×2): qty 1

## 2020-09-28 MED ORDER — ONDANSETRON HCL 4 MG/2ML IJ SOLN: 4.0000 mg | Freq: Four times a day (QID) | INTRAMUSCULAR | Status: DC | PRN

## 2020-09-28 MED ORDER — PROPOFOL 10 MG/ML IV BOLUS
INTRAVENOUS | Status: DC | PRN
  Administered 2020-09-28: 120 mg via INTRAVENOUS
  Administered 2020-09-28: 50 mg via INTRAVENOUS

## 2020-09-28 MED ORDER — ROCURONIUM BROMIDE 10 MG/ML (PF) SYRINGE
PREFILLED_SYRINGE | INTRAVENOUS | Status: DC | PRN
  Administered 2020-09-28: 10 mg via INTRAVENOUS
  Administered 2020-09-28: 50 mg via INTRAVENOUS

## 2020-09-28 MED ORDER — HYDROCODONE-ACETAMINOPHEN 5-325 MG PO TABS: 1.0000 | ORAL_TABLET | ORAL | Status: DC | PRN

## 2020-09-28 MED ORDER — ONDANSETRON HCL 4 MG/2ML IJ SOLN
INTRAMUSCULAR | Status: DC | PRN
  Administered 2020-09-28: 4 mg via INTRAVENOUS

## 2020-09-28 MED ORDER — FENTANYL CITRATE (PF) 100 MCG/2ML IJ SOLN: 25.0000 ug | INTRAMUSCULAR | Status: DC | PRN

## 2020-09-28 NOTE — Procedures (Signed)
Interventional Radiology Procedure Note  Procedure: Cryoablation of right renal neoplasm.   Complications: None  Estimated Blood Loss: None  Recommendations: - Admit for obs - ADAT - Bedrest x 6 hrs   Signed,  Criselda Peaches, MD

## 2020-09-28 NOTE — Anesthesia Preprocedure Evaluation (Addendum)
Anesthesia Evaluation  Patient identified by MRN, date of birth, ID band Patient awake    Reviewed: Allergy & Precautions, NPO status , Patient's Chart, lab work & pertinent test results  Airway Mallampati: II  TM Distance: >3 FB Neck ROM: Full    Dental  (+) Teeth Intact   Pulmonary neg pulmonary ROS, former smoker,    Pulmonary exam normal        Cardiovascular hypertension, + Cardiac Defibrillator (tachy-brady syndrome s/p AICD 2010)  Rhythm:Regular Rate:Normal     Neuro/Psych negative neurological ROS  negative psych ROS   GI/Hepatic Neg liver ROS, GERD  ,  Endo/Other  negative endocrine ROS  Renal/GU CRFRenal diseaseRight renal mass  negative genitourinary   Musculoskeletal negative musculoskeletal ROS (+)   Abdominal (+)  Abdomen: soft. Bowel sounds: normal.  Peds  Hematology negative hematology ROS (+)   Anesthesia Other Findings Device check 06/28/20 with pre-op instructions for magnet usage during procedure.   Reproductive/Obstetrics                            Anesthesia Physical Anesthesia Plan  ASA: III  Anesthesia Plan: General   Post-op Pain Management:    Induction: Intravenous  PONV Risk Score and Plan: 3 and Ondansetron, Dexamethasone and Treatment may vary due to age or medical condition  Airway Management Planned: Mask and Oral ETT  Additional Equipment: None  Intra-op Plan:   Post-operative Plan: Extubation in OR  Informed Consent:   Plan Discussed with: CRNA  Anesthesia Plan Comments: (Lab Results      Component                Value               Date                      WBC                      7.8                 09/21/2020                HGB                      13.6                09/21/2020                HCT                      44.6                09/21/2020                MCV                      94.7                09/21/2020                 PLT                      341                 09/21/2020           Lab Results  Component                Value               Date                      NA                       142                 09/21/2020                K                        4.6                 09/21/2020                CO2                      29                  09/21/2020                GLUCOSE                  100 (H)             09/21/2020                BUN                      15                  09/21/2020                CREATININE               0.74                09/21/2020                CALCIUM                  9.9                 09/21/2020                GFRNONAA                 >60                 09/21/2020                GFRAA                                        01/18/2009            >60        The eGFR has been calculated using the MDRD equation. This calculation has not been validated in all clinical situations. eGFR's persistently <60 mL/min signify possible Chronic Kidney Disease.  Device last checked 06/29/20 with normal functioning. Plan for magnet usage during procedure  Echo 06/24/19:  1. Left ventricular ejection fraction, by visual estimation, is 55 to 60%. The left ventricle has normal function. There is  no left ventricular hypertrophy.  2. The left ventricle has no regional wall motion abnormalities.  3. Global right ventricle has normal systolic function.The right ventricular size is normal. No increase in right ventricular wall thickness.  4. Pacing wires in RA/RV.  5. Left atrial size was normal.  6. Right atrial size was normal.  7. The mitral valve is normal in structure. Trivial mitral valve regurgitation. No evidence of mitral stenosis.  8. The tricuspid valve is normal in structure.  9. The tricuspid valve is normal in structure. Tricuspid valve regurgitation is mild.  10. The aortic valve is normal in structure. Aortic valve regurgitation is not visualized. Mild aortic  valve sclerosis without stenosis.  11. Pulmonic regurgitation is mild.  12. The pulmonic valve was grossly normal. Pulmonic valve regurgitation is mild.  13. Moderately elevated pulmonary artery systolic pressure.  14. The inferior vena cava is normal in size with greater than 50% respiratory variability, suggesting right atrial pressure of 3 mmHg. )        Anesthesia Quick Evaluation

## 2020-09-28 NOTE — Anesthesia Postprocedure Evaluation (Signed)
Anesthesia Post Note  Patient: Cassandra Romero  Procedure(s) Performed: IR WITH ANESTHESIA CT CRYOABLATION (Right )     Patient location during evaluation: PACU Anesthesia Type: General Level of consciousness: awake and alert Pain management: pain level controlled Vital Signs Assessment: post-procedure vital signs reviewed and stable Respiratory status: spontaneous breathing, nonlabored ventilation, respiratory function stable and patient connected to nasal cannula oxygen Cardiovascular status: blood pressure returned to baseline and stable Postop Assessment: no apparent nausea or vomiting Anesthetic complications: no   No complications documented.  Last Vitals:  Vitals:   09/28/20 1515 09/28/20 1554  BP: (!) 151/89 (!) 159/93  Pulse: 70 70  Resp: 16 16  Temp:    SpO2: 92% 100%    Last Pain:  Vitals:   09/28/20 1600  TempSrc:   PainSc: 0-No pain                 Belenda Cruise P Kendrea Cerritos

## 2020-09-28 NOTE — Anesthesia Postprocedure Evaluation (Signed)
Anesthesia Post Note  Patient: Pamila G Nedeau  Procedure(s) Performed: IR WITH ANESTHESIA CT CRYOABLATION (Right )     Patient location during evaluation: PACU Anesthesia Type: General Level of consciousness: awake and alert Pain management: pain level controlled Vital Signs Assessment: post-procedure vital signs reviewed and stable Respiratory status: spontaneous breathing, nonlabored ventilation, respiratory function stable and patient connected to nasal cannula oxygen Cardiovascular status: blood pressure returned to baseline and stable Postop Assessment: no apparent nausea or vomiting Anesthetic complications: no   No complications documented.  Last Vitals:  Vitals:   09/28/20 1515 09/28/20 1554  BP: (!) 151/89 (!) 159/93  Pulse: 70 70  Resp: 16 16  Temp:    SpO2: 92% 100%    Last Pain:  Vitals:   09/28/20 1600  TempSrc:   PainSc: 0-No pain                 Erricka Falkner P Pallas Wahlert     

## 2020-09-28 NOTE — OR PostOp (Incomplete)
PACU TO INPATIENT HANDOFF REPORT  Name/Age/Gender Cassandra Romero 87 y.o. female  Code Status Advance Directive Documentation   Flowsheet Row Most Recent Value  Type of Advance Directive Healthcare Power of Attorney, Living will  Pre-existing out of facility DNR order (yellow form or pink MOST form) -  "MOST" Form in Place? -      Home/SNF/Other {Discharge Destination:18313::"Home"}  Chief Complaint Renal benign neoplasm, right [D30.01]  Level of Care/Admitting Diagnosis ED Disposition    None      Medical History Past Medical History:  Diagnosis Date  . AICD (automatic cardioverter/defibrillator) present 2010   ST JUDE  . Atrial myxoma    s/p resection  . Chronic kidney disease    kidney tumor  . GERD (gastroesophageal reflux disease)   . Osteopenia   . Paroxysmal atrial fibrillation (HCC)   . Right renal mass   . Seasonal allergies 09/10/2020   nonproductive cough  . Tachy-brady syndrome (HCC)    s/p PPM  . URI (upper respiratory infection) 09/07/2020   WITH COLD AND COUGH , TOOK 5 DAYS OF ANTIBIOTICS ALL ISSUES RESOLVED PER PT  . Wears glasses   . Wears hearing aid in both ears     Allergies Allergies  Allergen Reactions  . Aspirin     Makes her feel sick  . Alendronate Sodium     REACTION: extreme fatigue/vision change  . Amiodarone Hcl Nausea And Vomiting  . Ibandronate Sodium     REACTION: extreme fatigue/vision changes  . Risedronate Sodium     REACTION: extreme fatigue/vision  . Xarelto [Rivaroxaban]     SEVERE ANEMIA  . Metaxalone Rash    IV Location/Drains/Wounds Patient Lines/Drains/Airways Status    Active Line/Drains/Airways    Name Placement date Placement time Site Days   Peripheral IV 09/28/20 Left;Posterior Wrist 09/28/20  1037  Wrist  less than 1   Urethral Catheter D Madden RN Latex;Straight-tip 18 Fr. 09/28/20  1210  Latex;Straight-tip  less than 1   Ureteral Drain/Stent Right ureter 6 Fr. 09/27/20  1156  Right ureter  1           Labs/Imaging Results for orders placed or performed during the hospital encounter of 09/27/20 (from the past 48 hour(s))  Protime-INR     Status: None   Collection Time: 09/27/20 10:40 AM  Result Value Ref Range   Prothrombin Time 13.0 11.4 - 15.2 seconds   INR 1.0 0.8 - 1.2    Comment: (NOTE) INR goal varies based on device and disease states. Performed at [facility], [place] [address] [place], [place] [address]    CT GUIDE TISSUE ABLATION  Result Date: 09/28/2020 INDICATION: 87 year old female with an enhancing and neoplasm of the lower pole of the right kidney consistent with a renal cell carcinoma. She presents for percutaneous ablation and possible biopsy. EXAM: Percutaneous cryoablation with CT guidance COMPARISON:  CT abdomen/pelvis 06/28/2020 MEDICATIONS: None. ANESTHESIA/SEDATION: General - as administered by the Anesthesia department FLUOROSCOPY TIME:  None. COMPLICATIONS: None immediate. TECHNIQUE: Informed written consent was obtained from the patient after a thorough discussion of the procedural risks, benefits and alternatives. All questions were addressed. Maximal Sterile Barrier Technique was utilized including caps, mask, sterile gowns, sterile gloves, sterile drape, hand hygiene and skin antiseptic. A timeout was performed prior to the initiation of the procedure. A planning axial CT scan was performed. The mildly exophytic lesion arising from the posterolateral aspect of the lower pole of the right kidney was successfully identified.  Lesion size is essentially unchanged at 1.7 x 1.4 cm compared to prior imaging from 06/28/2020. A suitable skin entry site was selected and marked. The overlying skin was sterilely prepped and draped in the standard fashion using chlorhexidine skin prep. A small dermatotomy was made. A single ice rod plus cryoablation probe was then advanced through the center of the mass along the long axis. The tip was positioned just  peripheral to the collecting system. At this point, consideration was made for possible biopsy. Given the relatively small size of the tumor, there was not adequate tumor tissue on either side of the cryo probe to facilitate biopsy. Therefore, biopsy was deferred. Cryoablation was then performed utilizing a 10 minutes freeze, 8 minutes passive thaw and 10 minutes freeze. Images were obtained throughout the procedure monitoring the ice ball. Ice coverage was considered adequate. After removal of the probe, a multiphase CT scan with contrast was performed. No evidence of arterial or venous bleeding. No evidence of contrast extravasation. The ablation zone appears to completely encompass the tumor. FINDINGS: Successful percutaneous cryoablation without evidence of complication. IMPRESSION: 1. Successful percutaneous cryoablation without evidence of complication. 2. Biopsy was deferred given the relatively small size of the mass and desire for optimal cryoprobe placement. Electronically Signed   By: Jacqulynn Cadet M.D.   On: 09/28/2020 15:00    Pending Labs   Vitals/Pain Today's Vitals   09/28/20 1430 09/28/20 1445 09/28/20 1500 09/28/20 1515  BP: 131/64 138/67 (!) 147/80 (!) 151/89  Pulse: 75 71 71 70  Resp: 11 14 14 16   Temp:   (!) 97.1 F (36.2 C)   TempSrc:      SpO2: 97% 99% 96% 92%  Weight:      Height:      PainSc: 0-No pain 0-No pain      Isolation Precautions @ISOLATION @  Administered Medications Periop Administered Meds from 09/28/2020 1039 to 09/28/2020 1522      Date/Time Order Dose Route Action Action by Comments    09/28/2020 1205 dexamethasone (DECADRON) injection 5 mg Intravenous Given Lollie Sails, CRNA     09/28/2020 1302 ePHEDrine sulfate (PF) 50mg  in normal saline 10 mL (5mg /mL) syringe 5 mg Intravenous Given Lollie Sails, CRNA     09/28/2020 1231 ePHEDrine sulfate (PF) 50mg  in normal saline 10 mL (5mg /mL) syringe 10 mg Intravenous Given Lollie Sails, CRNA      09/28/2020 1317 fentaNYL citrate (PF) (SUBLIMAZE) injection 100 mcg Intravenous Given Lollie Sails, CRNA     09/28/2020 1150 fentaNYL citrate (PF) (SUBLIMAZE) injection 50 mcg Intravenous Given Lollie Sails, CRNA     09/28/2020 1414 iohexol (OMNIPAQUE) 300 MG/ML solution 100 mL 75 mL Intravenous Contrast Given Kristian Covey A, RT     09/28/2020 1409 lactated ringers infusion   Intravenous Anesthesia Volume Adjustment Lollie Sails, CRNA     09/28/2020 1400 lactated ringers infusion   Intravenous New Bag/Given Lollie Sails, CRNA     09/28/2020 1145 lactated ringers infusion   Intravenous New Bag/Given Lollie Sails, CRNA     09/28/2020 1155 lidocaine 2% (20 mg/mL) 5 mL syringe 60 mg Intravenous Given Lollie Sails, CRNA     09/28/2020 1337 ondansetron (ZOFRAN) injection 4 mg Intravenous Given Lollie Sails, CRNA     09/28/2020 1314 phenylephrine (NEOSYNEPHRINE) 10-0.9 MG/250ML-% infusion 0 mcg/min Intravenous Stopped Lollie Sails, CRNA     09/28/2020 1307 phenylephrine (NEOSYNEPHRINE) 10-0.9 MG/250ML-% infusion 50 mcg/min Intravenous New Bag/Given Lollie Sails,  CRNA     09/28/2020 1306 PHENYLephrine 40 mcg/ml in normal saline Adult IV Push Syringe (For Blood Pressure Support) 80 mcg Intravenous Given Lollie Sails, CRNA     09/28/2020 1250 PHENYLephrine 40 mcg/ml in normal saline Adult IV Push Syringe (For Blood Pressure Support) 80 mcg Intravenous Given Lollie Sails, CRNA     09/28/2020 1229 PHENYLephrine 40 mcg/ml in normal saline Adult IV Push Syringe (For Blood Pressure Support) 120 mcg Intravenous Given Lollie Sails, CRNA     09/28/2020 1316 propofol (DIPRIVAN) 10 mg/mL bolus/IV push 50 mg Intravenous Given Lollie Sails, CRNA     09/28/2020 1155 propofol (DIPRIVAN) 10 mg/mL bolus/IV push 120 mg Intravenous Given Lollie Sails, CRNA     09/28/2020 1258 rocuronium bromide 10 mg/mL (PF) syringe 10 mg Intravenous Given Lollie Sails, CRNA     09/28/2020 1155  rocuronium bromide 10 mg/mL (PF) syringe 50 mg Intravenous Given Lollie Sails, CRNA     09/28/2020 1351 sugammadex sodium (BRIDION) injection 200 mg Intravenous Given Lollie Sails, CRNA       Mobility {Mobility:20148}

## 2020-09-28 NOTE — Transfer of Care (Signed)
Immediate Anesthesia Transfer of Care Note  Patient: Cassandra Romero  Procedure(s) Performed: IR WITH ANESTHESIA CT CRYOABLATION (Right )  Patient Location: PACU  Anesthesia Type:General  Level of Consciousness: awake, alert  and patient cooperative  Airway & Oxygen Therapy: Patient Spontanous Breathing and Patient connected to face mask oxygen  Post-op Assessment: Report given to RN and Post -op Vital signs reviewed and stable  Post vital signs: Reviewed and stable  Last Vitals:  Vitals Value Taken Time  BP    Temp    Pulse 72 09/28/20 1409  Resp 19 09/28/20 1409  SpO2 100 % 09/28/20 1409  Vitals shown include unvalidated device data.  Last Pain:  Vitals:   09/28/20 1027  TempSrc: Oral  PainSc: 0-No pain         Complications: No complications documented.

## 2020-09-28 NOTE — H&P (Signed)
Referring Physician(s): [place] Physician: Jacqulynn Cadet  Patient Status:  [facility] TBA  Chief Complaint: Right renal mass   Subjective: Patient familiar to IR service from recent consultation with Dr. Laurence Ferrari on 08/03/2020 to discuss treatment options for a newly noted right renal mass.  She was recently found to have a 1.7 x 1.4 x 1.5 cm solid enhancing mass in the posterior aspect of the lower pole of the right kidney concerning for renal cell carcinoma.  Following discussions with Dr. Laurence Ferrari she was deemed an appropriate candidate for CT-guided cryoablation/biopsy of the renal mass and presents today for the procedure.  She underwent prophylactic double-J ureteral stent placement on the right yesterday by Dr. Lovena Neighbours to provide adequate decompression and protection from potential urinary leak post ablation.  She currently denies fever, headache, chest pain, dyspnea, cough, abdominal pain, back pain, nausea, vomiting.  She does have some dysuria as well as occasional hematuria which is not unexpected post ureteral stent placement yesterday.  Additional medical history as below.  Past Medical History:  Diagnosis Date  . AICD (automatic cardioverter/defibrillator) present 2010   [place]  . Atrial myxoma    s/p resection  . Chronic kidney disease    kidney tumor  . GERD (gastroesophageal reflux disease)   . Osteopenia   . Paroxysmal atrial fibrillation (HCC)   . Right renal mass   . Seasonal allergies 09/10/2020   nonproductive cough  . Tachy-brady syndrome (HCC)    s/p PPM  . URI (upper respiratory infection) 09/07/2020   WITH COLD AND COUGH , TOOK 5 DAYS OF ANTIBIOTICS ALL ISSUES RESOLVED PER PT  . Wears glasses   . Wears hearing aid in both ears    Past Surgical History:  Procedure Laterality Date  . APPENDECTOMY  1954   OPEN  . ATRIAL MYXOMA EXCISION  12/24/08   Left -- Dr Roxy Manns  . BREAST SURGERY  1 ON RIGHT 2 ON LEFT   BIOPSY LAST DONE 1973, AND X  2 1960'S  . CHOLECYSTECTOMY  2010   LAPAPROSCIPIC  . IR RADIOLOGIST EVAL & MGMT  08/03/2020  . PACEMAKER INSERTION  2010  . PARTIAL HYSTERECTOMY  YRS AGO   STILL HAS OVARIES  . stapendectomy Bilateral 1970's     Allergies: Aspirin, Alendronate sodium, Amiodarone hcl, Ibandronate sodium, Risedronate sodium, Xarelto [rivaroxaban], and Metaxalone  Medications: Prior to Admission medications   Medication Sig Start Date End Date Taking? Authorizing Provider  famotidine (PEPCID) 20 MG tablet Take 20 mg by mouth as needed for heartburn or indigestion.   Yes [provider]  traMADol (ULTRAM) 50 MG tablet Take 1 tablet (50 mg total) by mouth every 6 (six) hours as needed for up to 3 days. 09/27/20 09/30/20  Ceasar Mons, MD     Vital Signs: Vitals:   09/28/20 1027  BP: (!) 161/82  Pulse: 72  Resp: 16  Temp: 97.7 F (36.5 C)  SpO2: 96%      Physical Exam awake, alert.  Chest with few fine bibasilar crackles.  Left chest wall pacer in place.  Heart with regular rate and rhythm.  Abdomen soft, positive bowel sounds, nontender.  No lower extremity edema.  Imaging: No results found.  Labs:  CBC: Recent Labs    09/21/20 1201  WBC 7.8  HGB 13.6  HCT 44.6  PLT 341    COAGS: Recent Labs    09/21/20 1201 09/27/20 1040  INR 1.0 1.0    BMP: Recent  Labs    09/21/20 1201  NA 142  K 4.6  CL 105  CO2 29  GLUCOSE 100*  BUN 15  CALCIUM 9.9  CREATININE 0.74  GFRNONAA >60    LIVER FUNCTION TESTS: No results for input(s): BILITOT, AST, ALT, ALKPHOS, PROT, ALBUMIN in the last 8760 hours.  Assessment and Plan: Patient with past medical history significant for prior atrial myxoma resection 2010, GERD, paroxysmal atrial fibrillation, tachy/brady syndrome with prior pacemaker placement . She is familiar to IR service from recent consultation with Dr. Laurence Ferrari on 08/03/2020 to discuss treatment options for a newly noted right renal mass.  She was recently  found to have a 1.7 x 1.4 x 1.5 cm solid enhancing mass in the posterior aspect of the lower pole of the right kidney concerning for renal cell carcinoma.  Following discussions with Dr. Laurence Ferrari she was deemed an appropriate candidate for CT-guided cryoablation/biopsy of the renal mass and presents today for the procedure.  She underwent prophylactic double-J ureteral stent placement on the right yesterday by Dr. Lovena Neighbours to provide adequate decompression and protection from potential urinary leak post ablation.  Details/risks of procedure, including but not limited to, internal bleeding, infection, injury to adjacent structures, anesthesia related complications discussed with patient with her understanding and consent.  Post procedure she will be admitted for overnight observation.   Electronically Signed: D. Rowe Robert, PA-C 09/28/2020, 10:27 AM   I spent a total of 30 minutes at the the patient's bedside AND on the patient's hospital floor or unit, greater than 50% of which was counseling/coordinating care for CT-guided cryoablation/possible biopsy of right renal mass

## 2020-09-28 NOTE — Anesthesia Procedure Notes (Signed)
Procedure Name: Intubation Date/Time: 09/28/2020 11:59 AM Performed by: Lollie Sails, CRNA Pre-anesthesia Checklist: Patient identified, Emergency Drugs available, Suction available, Patient being monitored and Timeout performed Patient Re-evaluated:Patient Re-evaluated prior to induction Oxygen Delivery Method: Circle system utilized Preoxygenation: Pre-oxygenation with 100% oxygen Induction Type: IV induction Ventilation: Mask ventilation without difficulty and Oral airway inserted - appropriate to patient size Laryngoscope Size: Miller and 3 Grade View: Grade I Tube type: Oral Tube size: 7.5 mm Number of attempts: 1 Airway Equipment and Method: Stylet Placement Confirmation: ETT inserted through vocal cords under direct vision,  positive ETCO2 and breath sounds checked- equal and bilateral Secured at: 23 cm Tube secured with: Tape Dental Injury: Teeth and Oropharynx as per pre-operative assessment

## 2020-09-29 ENCOUNTER — Encounter (HOSPITAL_COMMUNITY): Payer: Self-pay | Admitting: Interventional Radiology

## 2020-09-29 DIAGNOSIS — N2889 Other specified disorders of kidney and ureter: Secondary | ICD-10-CM | POA: Diagnosis not present

## 2020-09-29 NOTE — Discharge Summary (Addendum)
Patient ID: Cassandra Romero MRN: [id number] DOB/AGE: July 16, 1932 87 y.o.  Admit date: 09/28/2020 Discharge date: 09/29/2020  Supervising Physician: Markus Daft  Patient Status: [facility] - In-pt  Admission Diagnoses: Right renal mass   Discharge Diagnoses:  Active Problems:   posterior right renal mass with features concerning for renal cell carcinoma   Discharged Condition: good  Hospital Course:   Patient presented to [facility] IR for an image-guided right renal mass cryoablation with Dr. Laurence Ferrari on 09/28/20. Procedure occurred without major complications and patient was transferred to floor in stable condition (VSS, right flank puncture site stable) for overnight observation. No major events occurred overnight.   Patient awake and alert no complaints at this time. Foley catheter removed this AM without difficulty. Patient reports blood in urine. Discussed with Dr. Laurence Ferrari, patient underwent right ureteral JJ stent placement in OR with Urology on 09/27/20, and had gross hematuria prior to the renal cryoablation. VSS, patient is not showing any signs or symptoms of acute bleeding.  Patient denies right flank pain. Minimal bleeding noted at the right flank puncture site. Right flank puncture site otherwise stable, no signs of hematoma. Plan to discharge home today and follow-up with Dr. Laurence Ferrari for televisit 3-4 weeks after discharge.   Consults: None  Significant Diagnostic Studies: none.  Treatments: right renal cryoablation   Discharge Exam: Blood pressure (!) 132/114, pulse 75, temperature (!) 97.5 F (36.4 C), temperature source Oral, resp. rate 16, height 5\' 5"  (1.651 m), weight 132 lb (59.9 kg), SpO2 95 %.  Physical Exam  Vitals and nursing note reviewed.  Constitutional:      General: He is not in acute distress.    Appearance: Normal appearance.  HENT:     Head: Normocephalic and atraumatic.   Cardiovascular:     Rate and Rhythm: Normal rate and regular rhythm.      Pulses: Normal pulses.     Heart sounds: Normal heart sounds.  Pulmonary:     Effort: Pulmonary effort is normal.     Breath sounds: Normal breath sounds. No wheezing, rhonchi or rales.  Abdominal:     General: There is no distension.     Palpations: Abdomen is soft.    Positive for dressing on the right flank puncture site. Minimal blood noted on the dressing. Skin is soft, no TTP.  Skin:    General: Skin is warm and dry.  Neurological:     Mental Status: He is alert and oriented to person, place, and time.  Psychiatric:        Mood and Affect: Mood normal.        Behavior: Behavior normal.    Disposition: Discharge disposition: 01-Home or Self Care       Discharge Instructions    Call MD for:   Complete by: As directed    Call MD for:  difficulty breathing, headache or visual disturbances   Complete by: As directed    Call MD for:  extreme fatigue   Complete by: As directed    Call MD for:  hives   Complete by: As directed    Call MD for:  persistant dizziness or light-headedness   Complete by: As directed    Call MD for:  persistant nausea and vomiting   Complete by: As directed    Call MD for:  redness, tenderness, or signs of infection (pain, swelling, redness, odor or green/yellow discharge around incision site)   Complete by: As directed    Call MD for:  severe uncontrolled pain   Complete by: As directed    Call MD for:  temperature >100.4   Complete by: As directed    Diet - low sodium heart healthy   Complete by: As directed    Discharge wound care:   Complete by: As directed    Keep the right flank puncture site clean and dry as much as possible until fully heals. Dressing (bandage) change as needed. No submerging (bathing or swimming) for 7 days.   Increase activity slowly   Complete by: As directed    Lifting restrictions   Complete by: As directed    No lifting more than 10 lb for 7 days.     Allergies as of 09/29/2020      Reactions   Aspirin     Makes her feel sick   Alendronate Sodium    REACTION: extreme fatigue/vision change   Amiodarone Hcl Nausea And Vomiting   Ibandronate Sodium    REACTION: extreme fatigue/vision changes   Risedronate Sodium    REACTION: extreme fatigue/vision   Xarelto [rivaroxaban]    SEVERE ANEMIA   Metaxalone Rash      Medication List    TAKE these medications   acetaminophen 500 MG tablet Commonly known as: TYLENOL Take 500 mg by mouth every 6 (six) hours as needed.   famotidine 20 MG tablet Commonly known as: PEPCID Take 20 mg by mouth as needed for heartburn or indigestion.   traMADol 50 MG tablet Commonly known as: Ultram Take 1 tablet (50 mg total) by mouth every 6 (six) hours as needed for up to 3 days.            Discharge Care Instructions  (From admission, onward)         Start     Ordered   09/29/20 0000  Discharge wound care:       Comments: Keep the right flank puncture site clean and dry as much as possible until fully heals. Dressing (bandage) change as needed. No submerging (bathing or swimming) for 7 days.   09/29/20 3244          Follow-up Information    Criselda Peaches, MD Follow up.   Specialties: Interventional Radiology, Radiology Why: Televisit with Dr. Laurence Ferrari in 3-4 weeks. Our office will call you to set up the appointment.  Contact information: [address] [place] [address] [phone]                Electronically Signed: Tera Mater, PA-C 09/29/2020, 9:45 AM   I have spent Less Than 30 Minutes discharging Cassandra Romero.

## 2020-09-29 NOTE — Discharge Instructions (Signed)
Cryoablation of Renal Tumors, Care After  This sheet gives you information about how to care for yourself after your procedure. Your health care provider may also give you more specific instructions. If you have problems or questions, contact your health care provider.  What can I expect after the procedure? After your procedure, it is common to have pain and discomfort in the upper abdomen. You may be given prescription pain medicines to control this if the pain is severe.   Follow these instructions at home: Puncture site care Follow instructions from your health care provider about how to take care of your incision. Make sure you:  - Wash your hands with soap and water before you change your bandage (dressing). If soap and water are not available, use hand sanitizer.  - Ok to shower 48 hours following procedure. Recommend showering with bandage on, remove bandage immediately after showering and pat area dry. No further dressing changes needed after this- ensure area remains clean and dry until fully healed.  - No submerging (swimming, bathing) for 7 days post-procedure. Check your puncture site area every day for signs of infection. Check for: - Redness, swelling, or pain. - Fluid or blood. - Warmth. - Pus or a bad smell. Activity Rest as often as needing during the first few days of recovery. No stooping, bending, or lifting more than 10 pounds for 1 week.  General instructions To prevent or treat constipation while you are taking prescription pain medicine, your health care provider may recommend that you: - Drink enough fluid to keep your urine clear or pale yellow. - Take over-the-counter or prescription medicines. - Eat foods that are high in fiber, such as fresh fruits and vegetables, whole grains, and beans. - Limit foods that are high in fat and processed sugars, such as fried and sweet foods. - Do not use any products that contain nicotine or tobacco, such as cigarettes and  e-cigarettes. If you need help quitting, ask your health care provider. - Keep all follow-up visits as told by your health care provider. This is important.  3-4 week televisit with the doctor who performed the procedure. Our office will call you to set up the appointment.   Contact a health care provider if: You cannot pass gas. You are unable to have a bowel movement within 3 days. You have a skin rash.  Get help right away if: You have a fever. You have severe or lasting pain in your abdomen, shoulder, or back. You have trouble swallowing or breathing. You have severe weakness or dizziness. You have chest pain or shortness of breath.   This information is not intended to replace advice given to you by your health care provider. Make sure you discuss any questions you have with your health care provider. 

## 2020-09-30 ENCOUNTER — Encounter (HOSPITAL_BASED_OUTPATIENT_CLINIC_OR_DEPARTMENT_OTHER): Payer: Self-pay | Admitting: Urology

## 2020-10-02 ENCOUNTER — Other Ambulatory Visit: Payer: Self-pay

## 2020-10-02 ENCOUNTER — Encounter (HOSPITAL_COMMUNITY): Payer: Self-pay | Admitting: Emergency Medicine

## 2020-10-02 ENCOUNTER — Emergency Department (HOSPITAL_COMMUNITY): Payer: Medicare PPO

## 2020-10-02 ENCOUNTER — Telehealth: Payer: Self-pay | Admitting: Diagnostic Radiology

## 2020-10-02 ENCOUNTER — Emergency Department (HOSPITAL_COMMUNITY)
Admission: EM | Admit: 2020-10-02 | Discharge: 2020-10-02 | Disposition: A | Discharge status: Home / Self Care | Payer: Medicare PPO | Attending: Emergency Medicine | Admitting: Emergency Medicine

## 2020-10-02 DIAGNOSIS — K5900 Constipation, unspecified: Secondary | ICD-10-CM | POA: Insufficient documentation

## 2020-10-02 DIAGNOSIS — Z9049 Acquired absence of other specified parts of digestive tract: Secondary | ICD-10-CM | POA: Insufficient documentation

## 2020-10-02 DIAGNOSIS — K5750 Diverticulosis of both small and large intestine without perforation or abscess without bleeding: Secondary | ICD-10-CM | POA: Diagnosis not present

## 2020-10-02 DIAGNOSIS — R1030 Lower abdominal pain, unspecified: Secondary | ICD-10-CM

## 2020-10-02 DIAGNOSIS — K219 Gastro-esophageal reflux disease without esophagitis: Secondary | ICD-10-CM | POA: Insufficient documentation

## 2020-10-02 DIAGNOSIS — I129 Hypertensive chronic kidney disease with stage 1 through stage 4 chronic kidney disease, or unspecified chronic kidney disease: Secondary | ICD-10-CM | POA: Diagnosis not present

## 2020-10-02 DIAGNOSIS — Z87891 Personal history of nicotine dependence: Secondary | ICD-10-CM | POA: Insufficient documentation

## 2020-10-02 DIAGNOSIS — I700 Atherosclerosis of aorta: Secondary | ICD-10-CM | POA: Diagnosis not present

## 2020-10-02 DIAGNOSIS — R102 Pelvic and perineal pain: Secondary | ICD-10-CM | POA: Diagnosis not present

## 2020-10-02 DIAGNOSIS — N189 Chronic kidney disease, unspecified: Secondary | ICD-10-CM | POA: Diagnosis not present

## 2020-10-02 DIAGNOSIS — Z950 Presence of cardiac pacemaker: Secondary | ICD-10-CM | POA: Diagnosis not present

## 2020-10-02 DIAGNOSIS — R109 Unspecified abdominal pain: Secondary | ICD-10-CM | POA: Diagnosis present

## 2020-10-02 HISTORY — DX: Essential (primary) hypertension: I10

## 2020-10-02 LAB — COMPREHENSIVE METABOLIC PANEL
ALT: 27 U/L (ref 0–44)
AST: 30 U/L (ref 15–41)
Albumin: 4.3 g/dL (ref 3.5–5.0)
Alkaline Phosphatase: 65 U/L (ref 38–126)
Anion gap: 10 (ref 5–15)
BUN: 13 mg/dL (ref 8–23)
CO2: 27 mmol/L (ref 22–32)
Calcium: 9.9 mg/dL (ref 8.9–10.3)
Chloride: 104 mmol/L (ref 98–111)
Creatinine, Ser: 0.8 mg/dL (ref 0.44–1.00)
GFR, Estimated: >60 mL/min (ref >60)
Glucose, Bld: 106 mg/dL — ABNORMAL HIGH (ref 70–99)
Potassium: 3.4 mmol/L — ABNORMAL LOW (ref 3.5–5.1)
Sodium: 141 mmol/L (ref 135–145)
Total Bilirubin: 1.9 mg/dL — ABNORMAL HIGH (ref 0.3–1.2)
Total Protein: 7.9 g/dL (ref 6.5–8.1)

## 2020-10-02 LAB — CBC
HCT: 46.4 % — ABNORMAL HIGH (ref 36.0–46.0)
Hemoglobin: 14.5 g/dL (ref 12.0–15.0)
MCH: 29.2 pg (ref 26.0–34.0)
MCHC: 31.3 g/dL (ref 30.0–36.0)
MCV: 93.5 fL (ref 80.0–100.0)
Platelets: 254 10*3/uL (ref 150–400)
RBC: 4.96 MIL/uL (ref 3.87–5.11)
RDW: 13.1 % (ref 11.5–15.5)
WBC: 7.7 10*3/uL (ref 4.0–10.5)
nRBC: 0 % (ref 0.0–0.2)

## 2020-10-02 LAB — LIPASE, BLOOD: Lipase: 30 U/L (ref 11–51)

## 2020-10-02 MED ORDER — SORBITOL 70 % SOLN
960.0000 mL | TOPICAL_OIL | Freq: Once | ORAL | Status: AC
  Administered 2020-10-02: 960 mL via RECTAL
  Filled 2020-10-02: qty 473

## 2020-10-02 MED ORDER — IOHEXOL 300 MG/ML  SOLN
100.0000 mL | Freq: Once | INTRAMUSCULAR | Status: AC | PRN
  Administered 2020-10-02: 100 mL via INTRAVENOUS

## 2020-10-02 NOTE — ED Provider Notes (Signed)
[place] DEPT Provider Note   CSN: [id number] Arrival date & time: 10/02/20  1109    History Chief Complaint  Patient presents with  . Abdominal Pain    Cassandra Romero is a 87 y.o. female with past medical history significant for AICD, paroxysmal A. Fib, renal mass s/p recent IR biopsy who presents for evaluation of abdominal pain.  Began approximately 1 week ago.  Patient feels like this is due to constipation.  Has not had bowel movements over the last 6 days.  Had a ureteral stent placed on Tuesday.  She denies any urinary complaints.  Pain is described as cramping.  Denies any nausea or vomiting.  She has not any opioid pain medication.  She is passing flatulence.  No history of SBO.  States she took 3 pill laxatives last night which did not help.  Took Tylenol for pain which did not help as well.  No fever, chills, nausea, vomiting, chest pain, shortness of breath, dysuria, hematuria, flank pain, weakness.  Denies additional aggravating or alleviating factors  History obtained from patient and past medical records.  No interpreter used.  HPI     Past Medical History:  Diagnosis Date  . AICD (automatic cardioverter/defibrillator) present 2010   ST JUDE  . Atrial myxoma    s/p resection  . Chronic kidney disease    kidney tumor  . GERD (gastroesophageal reflux disease)   . Hypertension   . Osteopenia   . Paroxysmal atrial fibrillation (HCC)   . Right renal mass   . Seasonal allergies 09/10/2020   nonproductive cough  . Tachy-brady syndrome (HCC)    s/p PPM  . URI (upper respiratory infection) 09/07/2020   WITH COLD AND COUGH , TOOK 5 DAYS OF ANTIBIOTICS ALL ISSUES RESOLVED PER PT  . Wears glasses   . Wears hearing aid in both ears     Patient Active Problem List   Diagnosis Date Noted  . Renal benign neoplasm, right 09/28/2020  . Essential hypertension 07/20/2010  . ATRIAL MYXOMA 01/27/2009  . SICK SINUS/ TACHY-BRADY SYNDROME  01/27/2009  . Atrial fibrillation ([place]) 01/27/2009  . GERD 01/27/2009  . OSTEOPENIA 01/27/2009    Past Surgical History:  Procedure Laterality Date  . APPENDECTOMY  1954   OPEN  . ATRIAL MYXOMA EXCISION  12/24/08   Left -- Dr Roxy Manns  . BREAST SURGERY  1 ON RIGHT 2 ON LEFT   BIOPSY LAST DONE 1973, AND X 2 1960'S  . CHOLECYSTECTOMY  2010   LAPAPROSCIPIC  . CYSTOSCOPY W/ URETERAL STENT PLACEMENT Right 09/27/2020   Procedure: CYSTOSCOPY WITH RETROGRADE PYELOGRAM/URETERAL STENT PLACEMENT;  Surgeon: Ceasar Mons, MD;  Location: [facility];  Service: Urology;  Laterality: Right;  . IR RADIOLOGIST EVAL & MGMT  08/03/2020  . PACEMAKER INSERTION  2010  . PARTIAL HYSTERECTOMY  YRS AGO   STILL HAS OVARIES  . RADIOLOGY WITH ANESTHESIA Right 09/28/2020   Procedure: IR WITH ANESTHESIA CT CRYOABLATION;  Surgeon: Criselda Peaches, MD;  Location: [facility];  Service: Anesthesiology;  Laterality: Right;  . stapendectomy Bilateral 1970's     OB History   No obstetric history on file.     Family History  Problem Relation Age of Onset  . Pancreatic cancer Mother   . Transient ischemic attack Mother        numerous  . Dementia Mother   . Thyroid disease Mother   . Heart disease Father   . Heart attack Father  66  . Pancreatic cancer Other   . Dementia Other        parathyroid removal  . Coronary artery disease Other     Social History   Tobacco Use  . Smoking status: Former Smoker    Packs/day: 0.25    Years: 12.00    Pack years: 3.00    Types: Cigarettes    Quit date: 06/04/1988    Years since quitting: 32.3  . Smokeless tobacco: Never Used  Vaping Use  . Vaping Use: Never used  Substance Use Topics  . Alcohol use: Not Currently  . Drug use: No    Home Medications Prior to Admission medications   Medication Sig Start Date End Date Taking? Authorizing Provider  acetaminophen (TYLENOL) 500 MG tablet Take 500 mg by mouth every 6 (six) hours as needed.     [provider]  famotidine (PEPCID) 20 MG tablet Take 20 mg by mouth as needed for heartburn or indigestion.    [provider]    Allergies    Aspirin, Alendronate sodium, Amiodarone hcl, Ibandronate sodium, Risedronate sodium, Xarelto [rivaroxaban], and Metaxalone  Review of Systems   Review of Systems  Constitutional: Negative.   HENT: Negative.   Respiratory: Negative.   Cardiovascular: Negative.   Gastrointestinal: Positive for abdominal pain and constipation. Negative for abdominal distention, anal bleeding, blood in stool, diarrhea, nausea, rectal pain and vomiting.  Genitourinary: Negative.   Musculoskeletal: Negative.   Skin: Negative.   Neurological: Negative.   All other systems reviewed and are negative.   Physical Exam Updated Vital Signs BP (!) 153/88   Pulse 74   Temp 98.7 F (37.1 C) (Oral)   Resp 17   SpO2 93%   Physical Exam Vitals and nursing note reviewed. Exam conducted with a chaperone present.  Constitutional:      General: She is not in acute distress.    Appearance: She is well-developed. She is not ill-appearing, toxic-appearing or diaphoretic.  HENT:     Head: Normocephalic and atraumatic.     Mouth/Throat:     Mouth: Mucous membranes are moist.  Eyes:     Pupils: Pupils are equal, round, and reactive to light.  Cardiovascular:     Rate and Rhythm: Normal rate.     Heart sounds: Normal heart sounds.  Pulmonary:     Effort: Pulmonary effort is normal. No respiratory distress.     Breath sounds: Normal breath sounds.  Abdominal:     General: Bowel sounds are normal. There is no distension.     Palpations: Abdomen is soft.     Tenderness: There is generalized abdominal tenderness.     Comments: Diffuse generalized tenderness, worse to lower abdomen.  Hypoactive bowel sounds.  Left flank punctate biopsy wound without surrounding erythema, warmth, bleeding or drainage  Genitourinary:    Comments: Female tech in room. Large  impacted stool ball. Light brown. No gross blood. Hemerhoids. No fissure Musculoskeletal:        General: Normal range of motion.     Cervical back: Normal range of motion.  Skin:    General: Skin is warm and dry.     Capillary Refill: Capillary refill takes less than 2 seconds.     Comments: No erythema, warmth, rashes or lesions  Neurological:     General: No focal deficit present.     Mental Status: She is alert and oriented to person, place, and time.     ED Results / Procedures / Treatments  Labs (all labs ordered are listed, but only abnormal results are displayed) Labs Reviewed  COMPREHENSIVE METABOLIC PANEL - Abnormal; Notable for the following components:      Result Value   Potassium 3.4 (*)    Glucose, Bld 106 (*)    Total Bilirubin 1.9 (*)    All other components within normal limits  CBC - Abnormal; Notable for the following components:   HCT 46.4 (*)    All other components within normal limits  LIPASE, BLOOD  URINALYSIS, ROUTINE W REFLEX MICROSCOPIC    EKG None  Radiology CT Abdomen Pelvis W Contrast  Result Date: 10/02/2020 CLINICAL DATA:  87 year old female with acute abdominal and pelvic pain. Percutaneous cryoablation of RIGHT renal neoplasm on 09/28/2020. RIGHT urinary stent placed on 09/27/2020. EXAM: CT ABDOMEN AND PELVIS WITH CONTRAST TECHNIQUE: Multidetector CT imaging of the abdomen and pelvis was performed using the standard protocol following bolus administration of intravenous contrast. CONTRAST:  142mL OMNIPAQUE IOHEXOL 300 MG/ML  SOLN COMPARISON:  06/28/2020 and 09/28/2020 CTs FINDINGS: Lower chest: No acute abnormality. Bilateral LOWER lung scarring and mild cylindrical bronchiectasis again noted. Hepatobiliary: Possible mild hepatic steatosis again noted. No focal hepatic abnormalities are identified. The patient is status post cholecystectomy. No biliary dilatation. Pancreas: Unremarkable Spleen: Unremarkable Adrenals/Urinary Tract: Post ablation  changes within the posterior mid-lower RIGHT kidney noted. No associated gas, gross hemorrhage, focal collection or unexpected inflammation. A RIGHT urinary stent is noted with tips in the RIGHT renal pelvis and bladder. No hydronephrosis identified. The LEFT kidney and adrenal glands are unremarkable. Tiny focus of gas within the bladder is likely postprocedural or related to catheterization. Stomach/Bowel: There is no evidence of bowel obstruction or bowel wall thickening. A moderate to large amount of stool within the distal colon/rectum noted. No inflammatory changes are present. Colonic diverticulosis noted without evidence of diverticulitis. Vascular/Lymphatic: Aortic atherosclerosis. No enlarged abdominal or pelvic lymph nodes. Reproductive: Status post hysterectomy. No adnexal masses. Other: No ascites, focal collection or pneumoperitoneum. Musculoskeletal: No acute or suspicious bony abnormalities. IMPRESSION: 1. No evidence of acute abnormality. 2. Post ablation changes within the posterior RIGHT kidney without definite complicating features. 3. RIGHT urinary stent with tips in the RIGHT renal pelvis and bladder. No hydronephrosis. 4. Moderate to large amount of stool within the distal colon/rectum. 5. Aortic Atherosclerosis (ICD10-I70.0). Electronically Signed   By: Margarette Canada M.D.   On: 10/02/2020 13:56    Procedures Fecal disimpaction  Date/Time: 10/02/2020 3:01 PM Performed by: Nettie Elm, PA-C Authorized by: Nettie Elm, PA-C  Consent: Verbal consent obtained. Written consent not obtained. Risks and benefits: risks, benefits and alternatives were discussed Consent given by: patient Patient understanding: patient states understanding of the procedure being performed Patient consent: the patient's understanding of the procedure matches consent given Procedure consent: procedure consent matches procedure scheduled Relevant documents: relevant documents present and  verified Test results: test results available and properly labeled Site marked: the operative site was marked Imaging studies: imaging studies available Required items: required blood products, implants, devices, and special equipment available Patient identity confirmed: verbally with patient Time out: Immediately prior to procedure a "time out" was called to verify the correct patient, procedure, equipment, support staff and site/side marked as required. Preparation: Patient was prepped and draped in the usual sterile fashion.  Sedation: Patient sedated: no  Patient tolerance: patient tolerated the procedure well with no immediate complications      Medications Ordered in ED Medications  iohexol (OMNIPAQUE) 300 MG/ML solution 100  mL (100 mLs Intravenous Contrast Given 10/02/20 1304)  sorbitol, milk of mag, mineral oil, glycerin (SMOG) enema (960 mLs Rectal Given 10/02/20 1512)    ED Course  I have reviewed the triage vital signs and the nursing notes.  Pertinent labs & imaging results that were available during my care of the patient were reviewed by me and considered in my medical decision making (see chart for details).   87 year old here for evaluation abdominal pain which she feels is likely due to constipation.  No bowel movement over the last 6 days.  Did have recent IR procedure for renal biopsy.  She is afebrile, nonseptic, not ill-appearing.  She is tolerating p.o. intake.  Passing flatulence.  No urinary complaints.  Her heart lungs are clear.  Her abdomen is diffusely tender.  Nonfocal neuro exam without deficits.  Plan on labs, imaging and reassess.  Labs and imaging personally reviewed and interpreted:  CBC without leukocytosis CMP with mild hypokalemia 3.4, glucose 106, T bili 1.9 Lipase 30 UA unable to be obtained CTAP with large stool burden  Patient reassessed. Rectal exam with large impacted stool bolus.  Disimpacted with moderate removal however patient still  has large stool burden.  Will attempt enema.  Patient reassessed after enema.  Large amount of stool and able to void after enema.  She states she has no further abdominal pain after bowel movement.  Discussed bowel regimen at home.  Unfortunately she did urinate a lot any bowel movement this was not able to be obtained.  She denies any urinary complaints.  She states she will follow-up with PCP if she develops any complaints.  She would like a drink and to go home.  I feel this is reasonable.  Patient does not meet the SIRS or Sepsis criteria.  On repeat exam patient does not have a surgical abdomin and there are no peritoneal signs.  No indication of appendicitis, bowel obstruction, bowel perforation, cholecystitis, diverticulitis.  Symptoms likely related to constipation.  The patient has been appropriately medically screened and/or stabilized in the ED. I have low suspicion for any other emergent medical condition which would require further screening, evaluation or treatment in the ED or require inpatient management.  Patient is hemodynamically stable and in no acute distress.  Patient able to ambulate in department prior to ED.  Evaluation does not show acute pathology that would require ongoing or additional emergent interventions while in the emergency department or further inpatient treatment.  I have discussed the diagnosis with the patient and answered all questions.  Pain is been managed while in the emergency department and patient has no further complaints prior to discharge.  Patient is comfortable with plan discussed in room and is stable for discharge at this time.  I have discussed strict return precautions for returning to the emergency department.  Patient was encouraged to follow-up with PCP/specialist refer to at discharge.     MDM Rules/Calculators/A&P                           Final Clinical Impression(s) / ED Diagnoses Final diagnoses:  Lower abdominal pain  Constipation,  unspecified constipation type    Rx / DC Orders ED Discharge Orders    None       Giorgio Chabot A, PA-C 10/02/20 1628    Lacretia Leigh, MD 10/04/20 1015

## 2020-10-02 NOTE — ED Notes (Signed)
Pt reports complete pressure relief and little to no pain post enema administration. Significant amount of brown, liquid stool eliminated

## 2020-10-02 NOTE — ED Provider Notes (Signed)
I provided a substantive portion of the care of this patient.  I personally performed the entirety of the medical decision making for this encounter.    87 year old female presents with constipation.  Had an abdominal CT that was consistent with this.  Was disimpacted by physician assistant.  Feels much better will discharge   Lacretia Leigh, MD 10/02/20 1451

## 2020-10-02 NOTE — ED Triage Notes (Signed)
Patient c/o abdominal pain since last night. She reports her last BM was on Monday. She had a ureteral stent placed on Tuesday. Reports she has not had another BM since then. She states the pain is sharp and cramping. Denies n/v. Denies issues with urination. She reports taking a laxative last night which did not help. Took tylenol last night with no relief.

## 2020-10-02 NOTE — Progress Notes (Signed)
Patient ID: Cassandra Romero, female   DOB: 1932/12/25, 87 y.o.   MRN: [id number] Patient's daughter called to say that her mother has not had a bowel movement in 7 days.  She is complaining of severe cramping and believes it is related to constipation.  Patient is reportedly not hurting at ablation site and no significant urinary symptoms.  Daughter says that laxatives are not working.  Daughter will bring patient to [facility] ED for evaluation.

## 2020-10-02 NOTE — Discharge Instructions (Signed)
May use 1 scoop of MiraLAX in 4 ounces of liquid.  May repeat in the morning and evening over the next few days.  Follow with primary care provider  Return for new or worsening symptoms

## 2020-10-02 NOTE — ED Notes (Signed)
Pt on bedside commode post enema administration

## 2020-10-02 NOTE — ED Notes (Signed)
Pt to CT

## 2020-10-05 ENCOUNTER — Ambulatory Visit (INDEPENDENT_AMBULATORY_CARE_PROVIDER_SITE_OTHER): Payer: Medicare PPO

## 2020-10-05 DIAGNOSIS — I495 Sick sinus syndrome: Secondary | ICD-10-CM | POA: Diagnosis not present

## 2020-10-07 LAB — CUP PACEART REMOTE DEVICE CHECK
Battery Impedance: 1915 Ohm
Battery Remaining Longevity: 36 mo
Battery Voltage: 2.76 V
Brady Statistic AP VP Percent: 0 %
Brady Statistic AP VS Percent: 70 %
Brady Statistic AS VP Percent: 0 %
Brady Statistic AS VS Percent: 30 %
Date Time Interrogation Session: 20220505153805
Implantable Lead Implant Date: 20100729
Implantable Lead Implant Date: 20100729
Implantable Lead Location: 753859
Implantable Lead Location: 753860
Implantable Lead Model: 5076
Implantable Lead Model: 5076
Implantable Pulse Generator Implant Date: 20100729
Lead Channel Impedance Value: 416 Ohm
Lead Channel Impedance Value: 609 Ohm
Lead Channel Pacing Threshold Amplitude: 0.625 V
Lead Channel Pacing Threshold Amplitude: 1 V
Lead Channel Pacing Threshold Pulse Width: 0.4 ms
Lead Channel Pacing Threshold Pulse Width: 0.4 ms
Lead Channel Setting Pacing Amplitude: 2 V
Lead Channel Setting Pacing Amplitude: 2.5 V
Lead Channel Setting Pacing Pulse Width: 0.4 ms
Lead Channel Setting Sensing Sensitivity: 2.8 mV

## 2020-10-14 DIAGNOSIS — D49511 Neoplasm of unspecified behavior of right kidney: Secondary | ICD-10-CM | POA: Diagnosis not present

## 2020-10-18 DIAGNOSIS — D485 Neoplasm of uncertain behavior of skin: Secondary | ICD-10-CM | POA: Diagnosis not present

## 2020-10-20 DIAGNOSIS — Z6821 Body mass index (BMI) 21.0-21.9, adult: Secondary | ICD-10-CM | POA: Diagnosis not present

## 2020-10-20 DIAGNOSIS — C641 Malignant neoplasm of right kidney, except renal pelvis: Secondary | ICD-10-CM | POA: Diagnosis not present

## 2020-10-20 DIAGNOSIS — R0781 Pleurodynia: Secondary | ICD-10-CM | POA: Diagnosis not present

## 2020-10-22 NOTE — Addendum Note (Signed)
Encounter addended by: Annie Paras on: 10/22/2020 2:15 PM  Actions taken: Letter saved

## 2020-10-25 ENCOUNTER — Ambulatory Visit
Admission: RE | Admit: 2020-10-25 | Discharge: 2020-10-25 | Disposition: A | Discharge status: Home / Self Care | Payer: Medicare PPO | Source: Ambulatory Visit | Attending: Student | Admitting: Student

## 2020-10-25 ENCOUNTER — Other Ambulatory Visit: Payer: Self-pay

## 2020-10-25 DIAGNOSIS — D3001 Benign neoplasm of right kidney: Secondary | ICD-10-CM

## 2020-10-25 NOTE — Progress Notes (Signed)
Remote pacemaker transmission.   

## 2020-11-18 ENCOUNTER — Ambulatory Visit (INDEPENDENT_AMBULATORY_CARE_PROVIDER_SITE_OTHER): Payer: Medicare PPO | Admitting: Internal Medicine

## 2020-11-18 ENCOUNTER — Encounter: Payer: Self-pay | Admitting: Internal Medicine

## 2020-11-18 ENCOUNTER — Other Ambulatory Visit: Payer: Self-pay

## 2020-11-18 VITALS — BP 134/86 | HR 74 | Ht 65.0 in | Wt 133.2 lb

## 2020-11-18 DIAGNOSIS — I480 Paroxysmal atrial fibrillation: Secondary | ICD-10-CM | POA: Diagnosis not present

## 2020-11-18 DIAGNOSIS — R001 Bradycardia, unspecified: Secondary | ICD-10-CM

## 2020-11-18 DIAGNOSIS — I700 Atherosclerosis of aorta: Secondary | ICD-10-CM | POA: Diagnosis not present

## 2020-11-18 DIAGNOSIS — Z9049 Acquired absence of other specified parts of digestive tract: Secondary | ICD-10-CM | POA: Diagnosis not present

## 2020-11-18 DIAGNOSIS — Z85528 Personal history of other malignant neoplasm of kidney: Secondary | ICD-10-CM | POA: Diagnosis not present

## 2020-11-18 DIAGNOSIS — D151 Benign neoplasm of heart: Secondary | ICD-10-CM | POA: Diagnosis not present

## 2020-11-18 DIAGNOSIS — N2889 Other specified disorders of kidney and ureter: Secondary | ICD-10-CM | POA: Diagnosis not present

## 2020-11-18 DIAGNOSIS — N281 Cyst of kidney, acquired: Secondary | ICD-10-CM | POA: Diagnosis not present

## 2020-11-18 NOTE — Progress Notes (Signed)
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  Since last being seen in our clinic, the patient reports doing very well.  Today, she denies symptoms of palpitations, chest pain, shortness of breath,  lower extremity edema, dizziness, presyncope, or syncope.  The patient is otherwise without complaint today.   Past Medical History:  Diagnosis Date   AICD (automatic cardioverter/defibrillator) present 2010   ST JUDE   Atrial myxoma    s/p resection   Chronic kidney disease    kidney tumor   GERD (gastroesophageal reflux disease)    Hypertension    Osteopenia    Paroxysmal atrial fibrillation (HCC)    Right renal mass    Seasonal allergies 09/10/2020   nonproductive cough   Tachy-brady syndrome (HCC)    s/p PPM   URI (upper respiratory infection) 09/07/2020   WITH COLD AND COUGH , TOOK 5 DAYS OF ANTIBIOTICS ALL ISSUES RESOLVED PER PT   Wears glasses    Wears hearing aid in both ears    Past Surgical History:  Procedure Laterality Date   [place]  12/24/08   Left -- Dr Roxy Manns   BREAST SURGERY  1 ON RIGHT 2 ON LEFT   BIOPSY LAST DONE 1973, AND X 2 1960'S   CHOLECYSTECTOMY  2010   LAPAPROSCIPIC   CYSTOSCOPY W/ URETERAL STENT PLACEMENT Right 09/27/2020   Procedure: CYSTOSCOPY WITH RETROGRADE PYELOGRAM/URETERAL STENT PLACEMENT;  Surgeon: Ceasar Mons, MD;  Location: [facility];  Service: Urology;  Laterality: Right;   IR RADIOLOGIST EVAL & MGMT  08/03/2020   PACEMAKER INSERTION  2010   PARTIAL HYSTERECTOMY  YRS AGO   STILL HAS OVARIES   RADIOLOGY WITH ANESTHESIA Right 09/28/2020   Procedure: IR WITH ANESTHESIA CT CRYOABLATION;  Surgeon: Criselda Peaches, MD;  Location: [facility];  Service: Anesthesiology;  Laterality: Right;   stapendectomy Bilateral 1970's    ROS- all systems are reviewed and negative except as  per HPI above  Current Outpatient Medications  Medication Sig Dispense Refill   acetaminophen (TYLENOL) 500 MG tablet Take 500 mg by mouth every 6 (six) hours as needed.     famotidine (PEPCID) 20 MG tablet Take 20 mg by mouth as needed for heartburn or indigestion.     No current facility-administered medications for this visit.    Physical Exam: Vitals:   11/18/20 1612  BP: 134/86  Pulse: 74  SpO2: 96%  Weight: 133 lb 3.2 oz (60.4 kg)  Height: 5\' 5"  (1.651 m)    GEN- The patient is well appearing, alert and oriented x 3 today.   Head- normocephalic, atraumatic Eyes-  Sclera clear, conjunctiva pink Ears- hearing intact Oropharynx- clear Lungs- Clear to ausculation bilaterally, normal work of breathing Chest- pacemaker pocket is well healed Heart- Regular rate and rhythm, no murmurs, rubs or gallops, PMI not laterally displaced GI- soft, NT, ND, + BS Extremities- no clubbing, cyanosis, or edema  Pacemaker interrogation- remotes reviewed  ekg tracing ordered today is personally reviewed and shows atrial paced rhythm  Assessment and Plan:  1. Symptomatic sinus bradycardia   Normal pacemaker function by remotes See Pace Art report No changes today  2. Atrial myxoma S/p resection Doing well Echo 2021 reviewed  3. Afib Burden is <1 %  Return to see EP APP  Thompson Grayer  MD, [facility] 11/18/2020 4:17 PM

## 2020-11-18 NOTE — Patient Instructions (Addendum)
Medication Instructions:  Your physician recommends that you continue on your current medications as directed. Please refer to the Current Medication list given to you today.  Labwork: None ordered.  Testing/Procedures: None ordered.  Follow-Up: Your physician wants you to follow-up in: 12 months with Thompson Grayer, MD or one of the following Advanced Practice Providers on your designated Care Team:    Tommye Standard, PA-C    You will receive a reminder letter in the mail two months in advance. If you don't receive a letter, please call our office to schedule the follow-up appointment.  Remote monitoring is used to monitor your Pacemaker from home. This monitoring reduces the number of office visits required to check your device to one time per year. It allows [place] to keep an eye on the functioning of your device to ensure it is working properly. You are scheduled for a device check from home on 01/04/21. You may send your transmission at any time that day. If you have a wireless device, the transmission will be sent automatically. After your physician reviews your transmission, you will receive a postcard with your next transmission date.  Any Other Special Instructions Will Be Listed Below (If Applicable).  If you need a refill on your cardiac medications before your next appointment, please call your pharmacy.

## 2020-12-15 DIAGNOSIS — D49511 Neoplasm of unspecified behavior of right kidney: Secondary | ICD-10-CM | POA: Diagnosis not present

## 2021-01-04 ENCOUNTER — Ambulatory Visit (INDEPENDENT_AMBULATORY_CARE_PROVIDER_SITE_OTHER): Payer: Medicare PPO

## 2021-01-04 DIAGNOSIS — I495 Sick sinus syndrome: Secondary | ICD-10-CM

## 2021-01-04 LAB — CUP PACEART REMOTE DEVICE CHECK
Battery Impedance: 2059 Ohm
Battery Remaining Longevity: 33 mo
Battery Voltage: 2.76 V
Brady Statistic AP VP Percent: 0 %
Brady Statistic AP VS Percent: 71 %
Brady Statistic AS VP Percent: 0 %
Brady Statistic AS VS Percent: 29 %
Date Time Interrogation Session: 20220803081849
Implantable Lead Implant Date: 20100729
Implantable Lead Implant Date: 20100729
Implantable Lead Location: 753859
Implantable Lead Location: 753860
Implantable Lead Model: 5076
Implantable Lead Model: 5076
Implantable Pulse Generator Implant Date: 20100729
Lead Channel Impedance Value: 441 Ohm
Lead Channel Impedance Value: 654 Ohm
Lead Channel Pacing Threshold Amplitude: 0.625 V
Lead Channel Pacing Threshold Amplitude: 0.75 V
Lead Channel Pacing Threshold Pulse Width: 0.4 ms
Lead Channel Pacing Threshold Pulse Width: 0.4 ms
Lead Channel Setting Pacing Amplitude: 2 V
Lead Channel Setting Pacing Amplitude: 2.5 V
Lead Channel Setting Pacing Pulse Width: 0.4 ms
Lead Channel Setting Sensing Sensitivity: 4 mV

## 2021-01-05 DIAGNOSIS — M545 Low back pain, unspecified: Secondary | ICD-10-CM | POA: Diagnosis not present

## 2021-01-05 DIAGNOSIS — Z6822 Body mass index (BMI) 22.0-22.9, adult: Secondary | ICD-10-CM | POA: Diagnosis not present

## 2021-01-08 DIAGNOSIS — M549 Dorsalgia, unspecified: Secondary | ICD-10-CM | POA: Diagnosis not present

## 2021-01-10 DIAGNOSIS — D045 Carcinoma in situ of skin of trunk: Secondary | ICD-10-CM | POA: Diagnosis not present

## 2021-01-14 DIAGNOSIS — S32010A Wedge compression fracture of first lumbar vertebra, initial encounter for closed fracture: Secondary | ICD-10-CM | POA: Diagnosis not present

## 2021-01-14 DIAGNOSIS — M545 Low back pain, unspecified: Secondary | ICD-10-CM | POA: Diagnosis not present

## 2021-01-14 DIAGNOSIS — M549 Dorsalgia, unspecified: Secondary | ICD-10-CM | POA: Diagnosis not present

## 2021-01-14 DIAGNOSIS — R52 Pain, unspecified: Secondary | ICD-10-CM | POA: Diagnosis not present

## 2021-01-14 DIAGNOSIS — M48061 Spinal stenosis, lumbar region without neurogenic claudication: Secondary | ICD-10-CM | POA: Diagnosis not present

## 2021-01-17 DIAGNOSIS — S32000A Wedge compression fracture of unspecified lumbar vertebra, initial encounter for closed fracture: Secondary | ICD-10-CM | POA: Diagnosis not present

## 2021-01-30 DIAGNOSIS — N83201 Unspecified ovarian cyst, right side: Secondary | ICD-10-CM | POA: Diagnosis not present

## 2021-01-30 NOTE — Progress Notes (Signed)
Remote pacemaker transmission.   

## 2021-02-03 DIAGNOSIS — M810 Age-related osteoporosis without current pathological fracture: Secondary | ICD-10-CM | POA: Diagnosis not present

## 2021-02-03 DIAGNOSIS — M4850XA Collapsed vertebra, not elsewhere classified, site unspecified, initial encounter for fracture: Secondary | ICD-10-CM | POA: Diagnosis not present

## 2021-02-03 DIAGNOSIS — Z6821 Body mass index (BMI) 21.0-21.9, adult: Secondary | ICD-10-CM | POA: Diagnosis not present

## 2021-02-15 ENCOUNTER — Other Ambulatory Visit: Payer: Self-pay

## 2021-02-15 ENCOUNTER — Other Ambulatory Visit: Payer: Self-pay | Admitting: Interventional Radiology

## 2021-02-15 DIAGNOSIS — N2889 Other specified disorders of kidney and ureter: Secondary | ICD-10-CM

## 2021-02-17 ENCOUNTER — Other Ambulatory Visit: Payer: Self-pay

## 2021-02-17 DIAGNOSIS — N2889 Other specified disorders of kidney and ureter: Secondary | ICD-10-CM

## 2021-03-06 DIAGNOSIS — M4856XA Collapsed vertebra, not elsewhere classified, lumbar region, initial encounter for fracture: Secondary | ICD-10-CM | POA: Diagnosis not present

## 2021-03-06 DIAGNOSIS — N2889 Other specified disorders of kidney and ureter: Secondary | ICD-10-CM | POA: Diagnosis not present

## 2021-03-06 DIAGNOSIS — Z9049 Acquired absence of other specified parts of digestive tract: Secondary | ICD-10-CM | POA: Diagnosis not present

## 2021-03-06 DIAGNOSIS — I700 Atherosclerosis of aorta: Secondary | ICD-10-CM | POA: Diagnosis not present

## 2021-03-06 DIAGNOSIS — N281 Cyst of kidney, acquired: Secondary | ICD-10-CM | POA: Diagnosis not present

## 2021-03-07 ENCOUNTER — Encounter: Payer: Self-pay | Admitting: *Deleted

## 2021-03-07 ENCOUNTER — Other Ambulatory Visit: Payer: Self-pay

## 2021-03-07 ENCOUNTER — Ambulatory Visit
Admission: RE | Admit: 2021-03-07 | Discharge: 2021-03-07 | Disposition: A | Discharge status: Home / Self Care | Payer: Medicare PPO | Source: Ambulatory Visit | Attending: Interventional Radiology | Admitting: Interventional Radiology

## 2021-03-07 DIAGNOSIS — N2889 Other specified disorders of kidney and ureter: Secondary | ICD-10-CM

## 2021-03-07 DIAGNOSIS — Z9889 Other specified postprocedural states: Secondary | ICD-10-CM | POA: Diagnosis not present

## 2021-03-07 HISTORY — PX: IR RADIOLOGIST EVAL & MGMT: IMG5224

## 2021-03-07 NOTE — Progress Notes (Signed)
Chief Complaint: Patient was seen in follow up remotely today (TeleHealth) for right renal mass at the request of Onell Mcmath K.    Referring Physician(s): Dr. Gerald Stabs Lovena Neighbours  History of Present Illness: Cassandra Romero is a 87 y.o. female who was recently found to have a 1.7 x 1.4 x 1.5 cm solid enhancing mass in the posterior aspect of the lower pole of the right kidney concerning for renal cell carcinoma.  Her most recent imaging was on 06/28/2020.  Mrs. Carr is well-known to me.  I have been taking care of her husband for over a year for a chronic right-sided distal ureteral stricture and associated nephroureteral tube.  Mrs. Haughton is his primary caregiver.  Given the location of her lesion (it extends centrally and abuts a lower pole calyx) relatively advanced age and her status is a primary caregiver, Dr. Lovena Neighbours feels that she is a relatively high risk surgical candidate.  I definitely agree with that assessment.   She underwent percutaneous thermal ablation on 09/28/2020.  Her postprocedural course was not complicated and she recovered fully.  I saw her in follow-up at [facility].  Biopsy was not performed at the time of ablation due to small lesion size.  Follow-up renal protocol CT evaluation performed 03/06/2021 demonstrates an expected post ablation zone defect with no findings to suggest residual or recurrent viable tumor.  Overall, she is doing quite well.  Her only issues relate to a right sided ovarian cyst which is causing her some bladder related issues.  She is having this worked up concurrently.  Past Medical History:  Diagnosis Date   AICD (automatic cardioverter/defibrillator) present 2010   [place]   Atrial myxoma    s/p resection   Chronic kidney disease    kidney tumor   GERD (gastroesophageal reflux disease)    Hypertension    Osteopenia    Paroxysmal atrial fibrillation (HCC)    Right renal mass    Seasonal allergies 09/10/2020   nonproductive  cough   Tachy-brady syndrome (HCC)    s/p PPM   URI (upper respiratory infection) 09/07/2020   WITH COLD AND COUGH , TOOK 5 DAYS OF ANTIBIOTICS ALL ISSUES RESOLVED PER PT   Wears glasses    Wears hearing aid in both ears     Past Surgical History:  Procedure Laterality Date   [place]  12/24/08   Left -- Dr Roxy Manns   BREAST SURGERY  1 ON RIGHT 2 ON LEFT   BIOPSY LAST DONE 1973, AND X 2 1960'S   CHOLECYSTECTOMY  2010   LAPAPROSCIPIC   CYSTOSCOPY W/ URETERAL STENT PLACEMENT Right 09/27/2020   Procedure: CYSTOSCOPY WITH RETROGRADE PYELOGRAM/URETERAL STENT PLACEMENT;  Surgeon: Ceasar Mons, MD;  Location: [facility];  Service: Urology;  Laterality: Right;   IR RADIOLOGIST EVAL & MGMT  08/03/2020   IR RADIOLOGIST EVAL & MGMT  03/07/2021   PACEMAKER INSERTION  2010   PARTIAL HYSTERECTOMY  YRS AGO   STILL HAS OVARIES   RADIOLOGY WITH ANESTHESIA Right 09/28/2020   Procedure: IR WITH ANESTHESIA CT CRYOABLATION;  Surgeon: Criselda Peaches, MD;  Location: [facility];  Service: Anesthesiology;  Laterality: Right;   stapendectomy Bilateral 1970's    Allergies: Aspirin, Alendronate sodium, Amiodarone hcl, Ibandronate sodium, Risedronate sodium, Xarelto [rivaroxaban], and Metaxalone  Medications: Prior to Admission medications   Medication Sig Start Date End Date Taking? Authorizing Provider  acetaminophen (TYLENOL) 500  MG tablet Take 500 mg by mouth every 6 (six) hours as needed.    [provider]  famotidine (PEPCID) 20 MG tablet Take 20 mg by mouth as needed for heartburn or indigestion.    [provider]     Family History  Problem Relation Age of Onset   Pancreatic cancer Mother    Transient ischemic attack Mother        numerous   Dementia Mother    Thyroid disease Mother    Heart disease Father    Heart attack Father 18   Pancreatic cancer Other    Dementia Other        parathyroid removal    Coronary artery disease Other     Social History   Socioeconomic History   Marital status: Married    Spouse name: Not on file   Number of children: Not on file   Years of education: Not on file   Highest education level: Not on file  Occupational History   Occupation: retired Pharmacist, hospital  Tobacco Use   Smoking status: Former    Packs/day: 0.25    Years: 12.00    Pack years: 3.00    Types: Cigarettes    Quit date: 06/04/1988    Years since quitting: 32.7   Smokeless tobacco: Never  Vaping Use   Vaping Use: Never used  Substance and Sexual Activity   Alcohol use: Not Currently   Drug use: No   Sexual activity: Not on file  Other Topics Concern   Not on file  Social History Narrative   Lives in [place] Determinants of Health   Financial Resource Strain: Not on file  Food Insecurity: Not on file  Transportation Needs: Not on file  Physical Activity: Not on file  Stress: Not on file  Social Connections: Not on file    Review of Systems  Review of Systems: A 12 point ROS discussed and pertinent positives are indicated in the HPI above.  All other systems are negative.  Physical Exam No direct physical exam was performed (except for noted visual exam findings with Video Visits).   Vital Signs: There were no vitals taken for this visit.  Imaging: IR Radiologist Eval & Mgmt  Result Date: 03/07/2021 Please refer to notes tab for details about interventional procedure. (Op Note)   Labs:  CBC: Recent Labs    09/21/20 1201 10/02/20 1153  WBC 7.8 7.7  HGB 13.6 14.5  HCT 44.6 46.4*  PLT 341 254    COAGS: Recent Labs    09/21/20 1201 09/27/20 1040  INR 1.0 1.0    BMP: Recent Labs    09/21/20 1201 10/02/20 1153  NA 142 141  K 4.6 3.4*  CL 105 104  CO2 29 27  GLUCOSE 100* 106*  BUN 15 13  CALCIUM 9.9 9.9  CREATININE 0.74 0.80  GFRNONAA >60 >60    LIVER FUNCTION TESTS: Recent Labs    10/02/20 1153  BILITOT 1.9*  AST 30  ALT 27   ALKPHOS 65  PROT 7.9  ALBUMIN 4.3    TUMOR MARKERS: No results for input(s): AFPTM, CEA, CA199, CHROMGRNA in the last 8760 hours.  Assessment and Plan:  Doing very well 6 months status post percutaneous thermal ablation of right renal mass.  No evidence of residual or recurrent disease at initial follow-up.  1.)  Follow-up contrast enhanced renal protocol CT scan of the abdomen and accompanying clinic visit in 6 months.  Electronically Signed: Criselda Peaches 03/07/2021, 4:15 PM   I spent a total of 15 Minutes in remote  clinical consultation, greater than 50% of which was counseling/coordinating care for right renal neoplasm.    Visit type: Audio only (telephone). Audio (no video) only due to patient preference. Alternative for in-person consultation at [facility], [address] [place], [place], [place]. This visit type was conducted due to national recommendations for restrictions regarding the COVID-19 Pandemic (e.g. social distancing).  This format is felt to be most appropriate for this patient at this time.  All issues noted in this document were discussed and addressed.

## 2021-03-08 DIAGNOSIS — N83201 Unspecified ovarian cyst, right side: Secondary | ICD-10-CM | POA: Diagnosis not present

## 2021-03-14 DIAGNOSIS — M545 Low back pain, unspecified: Secondary | ICD-10-CM | POA: Diagnosis not present

## 2021-03-14 DIAGNOSIS — S32000A Wedge compression fracture of unspecified lumbar vertebra, initial encounter for closed fracture: Secondary | ICD-10-CM | POA: Diagnosis not present

## 2021-03-17 DIAGNOSIS — M810 Age-related osteoporosis without current pathological fracture: Secondary | ICD-10-CM | POA: Diagnosis not present

## 2021-03-29 DIAGNOSIS — K644 Residual hemorrhoidal skin tags: Secondary | ICD-10-CM | POA: Diagnosis not present

## 2021-03-29 DIAGNOSIS — K5900 Constipation, unspecified: Secondary | ICD-10-CM | POA: Diagnosis not present

## 2021-03-29 DIAGNOSIS — K625 Hemorrhage of anus and rectum: Secondary | ICD-10-CM | POA: Diagnosis not present

## 2021-03-31 DIAGNOSIS — M25551 Pain in right hip: Secondary | ICD-10-CM | POA: Diagnosis not present

## 2021-03-31 DIAGNOSIS — M545 Low back pain, unspecified: Secondary | ICD-10-CM | POA: Diagnosis not present

## 2021-04-06 DIAGNOSIS — Z1231 Encounter for screening mammogram for malignant neoplasm of breast: Secondary | ICD-10-CM | POA: Diagnosis not present

## 2021-04-10 ENCOUNTER — Other Ambulatory Visit (HOSPITAL_COMMUNITY): Payer: Self-pay | Admitting: Orthopedic Surgery

## 2021-04-10 DIAGNOSIS — M545 Low back pain, unspecified: Secondary | ICD-10-CM | POA: Diagnosis not present

## 2021-04-11 DIAGNOSIS — L728 Other follicular cysts of the skin and subcutaneous tissue: Secondary | ICD-10-CM | POA: Diagnosis not present

## 2021-04-11 DIAGNOSIS — L570 Actinic keratosis: Secondary | ICD-10-CM | POA: Diagnosis not present

## 2021-04-11 DIAGNOSIS — R233 Spontaneous ecchymoses: Secondary | ICD-10-CM | POA: Diagnosis not present

## 2021-04-17 DIAGNOSIS — N6012 Diffuse cystic mastopathy of left breast: Secondary | ICD-10-CM | POA: Diagnosis not present

## 2021-04-17 DIAGNOSIS — N6011 Diffuse cystic mastopathy of right breast: Secondary | ICD-10-CM | POA: Diagnosis not present

## 2021-05-11 DIAGNOSIS — N83201 Unspecified ovarian cyst, right side: Secondary | ICD-10-CM | POA: Diagnosis not present

## 2021-05-22 DIAGNOSIS — K219 Gastro-esophageal reflux disease without esophagitis: Secondary | ICD-10-CM | POA: Diagnosis not present

## 2021-05-22 DIAGNOSIS — D151 Benign neoplasm of heart: Secondary | ICD-10-CM | POA: Diagnosis not present

## 2021-05-22 DIAGNOSIS — Z6821 Body mass index (BMI) 21.0-21.9, adult: Secondary | ICD-10-CM | POA: Diagnosis not present

## 2021-05-22 DIAGNOSIS — Z636 Dependent relative needing care at home: Secondary | ICD-10-CM | POA: Diagnosis not present

## 2021-05-22 DIAGNOSIS — Z950 Presence of cardiac pacemaker: Secondary | ICD-10-CM | POA: Diagnosis not present

## 2021-05-23 ENCOUNTER — Telehealth: Payer: Self-pay | Admitting: Physician Assistant

## 2021-05-23 NOTE — Telephone Encounter (Signed)
Dr. Humphrey Rolls sent over referral for patient to see Dr. Agustin Cree in the [facility] office due to being closer to patient home.   Is this transfer ok?

## 2021-06-08 ENCOUNTER — Encounter: Payer: Self-pay | Admitting: Cardiology

## 2021-06-08 ENCOUNTER — Encounter: Payer: Self-pay | Admitting: *Deleted

## 2021-06-20 DIAGNOSIS — R109 Unspecified abdominal pain: Secondary | ICD-10-CM | POA: Diagnosis not present

## 2021-06-20 DIAGNOSIS — K5900 Constipation, unspecified: Secondary | ICD-10-CM | POA: Diagnosis not present

## 2021-06-27 ENCOUNTER — Other Ambulatory Visit: Payer: Self-pay | Admitting: Cardiology

## 2021-06-27 ENCOUNTER — Other Ambulatory Visit: Payer: Self-pay

## 2021-06-27 ENCOUNTER — Encounter: Payer: Self-pay | Admitting: Cardiology

## 2021-06-27 ENCOUNTER — Ambulatory Visit: Payer: Medicare PPO | Admitting: Cardiology

## 2021-06-27 ENCOUNTER — Ambulatory Visit (INDEPENDENT_AMBULATORY_CARE_PROVIDER_SITE_OTHER): Payer: Medicare PPO

## 2021-06-27 VITALS — BP 134/76 | HR 82 | Ht 65.0 in | Wt 133.6 lb

## 2021-06-27 DIAGNOSIS — I480 Paroxysmal atrial fibrillation: Secondary | ICD-10-CM | POA: Diagnosis not present

## 2021-06-27 DIAGNOSIS — I10 Essential (primary) hypertension: Secondary | ICD-10-CM | POA: Diagnosis not present

## 2021-06-27 DIAGNOSIS — N6452 Nipple discharge: Secondary | ICD-10-CM | POA: Diagnosis not present

## 2021-06-27 DIAGNOSIS — I5041 Acute combined systolic (congestive) and diastolic (congestive) heart failure: Secondary | ICD-10-CM | POA: Diagnosis not present

## 2021-06-27 DIAGNOSIS — I509 Heart failure, unspecified: Secondary | ICD-10-CM

## 2021-06-27 DIAGNOSIS — I495 Sick sinus syndrome: Secondary | ICD-10-CM

## 2021-06-27 HISTORY — DX: Heart failure, unspecified: I50.9

## 2021-06-27 MED ORDER — FUROSEMIDE 40 MG PO TABS: 40.0000 mg | ORAL_TABLET | Freq: Every day | ORAL | 3 refills | Status: DC

## 2021-06-27 MED ORDER — POTASSIUM CHLORIDE ER 10 MEQ PO TBCR: 10.0000 meq | EXTENDED_RELEASE_TABLET | Freq: Every day | ORAL | 3 refills | Status: DC

## 2021-06-27 NOTE — Progress Notes (Signed)
ekg 

## 2021-06-27 NOTE — Patient Instructions (Signed)
Medication Instructions:  Your physician has recommended you make the following change in your medication:   START: Lasix 40 mg daily START: Potasium 10 mEq daily  *If you need a refill on your cardiac medications before your next appointment, please call your pharmacy*   Lab Work: Labs today: CBC, CMP, Pro BNP If you have labs (blood work) drawn today and your tests are completely normal, you will receive your results only by: [place] (if you have MyChart) OR A paper copy in the mail If you have any lab test that is abnormal or we need to change your treatment, we will call you to review the results.   Testing/Procedures: Your physician has requested that you have an echocardiogram. Echocardiography is a painless test that uses sound waves to create images of your heart. It provides your doctor with information about the size and shape of your heart and how well your hearts chambers and valves are working. This procedure takes approximately one hour. There are no restrictions for this procedure.    Follow-Up: At [facility], you and your health needs are our priority.  As part of our continuing mission to provide you with exceptional heart care, we have created designated Provider Care Teams.  These Care Teams include your primary Cardiologist (physician) and Advanced Practice Providers (APPs -  Physician Assistants and Nurse Practitioners) who all work together to provide you with the care you need, when you need it.  We recommend signing up for the patient portal called "MyChart".  Sign up information is provided on this After Visit Summary.  MyChart is used to connect with patients for Virtual Visits (Telemedicine).  Patients are able to view lab/test results, encounter notes, upcoming appointments, etc.  Non-urgent messages can be sent to your provider as well.   To learn more about what you can do with MyChart, go to [email or web address].    Your next appointment:    1 week(s)  The format for your next appointment:   In Person  Provider:   Jenne Campus, MD    Other Instructions None

## 2021-06-27 NOTE — Addendum Note (Signed)
Addended by: Jenne Campus on: 06/27/2021 04:26 PM   Modules accepted: Level of Service

## 2021-06-27 NOTE — Progress Notes (Signed)
Cardiology Consultation:    Date:  06/27/2021   ID:  Cassandra Romero, DOB March 04, 1933, MRN [id number]  PCP:  Mateo Flow, MD  Cardiologist:  Jenne Campus, MD   Referring MD: Mateo Flow, MD   Chief Complaint  Patient presents with   Shortness of Breath        Leg Swelling   decrease appetite   Gained weight     6lbs in 2 weeks     History of Present Illness:    Cassandra Romero is a 88 y.o. female who is being seen today for the evaluation of shortness of breath at the request of Mateo Flow, MD. lady with Quite complex past medical history.  I met her many years ago when she got myxoma diagnosis and she ended up having surgery to remove myxoma she will also have history of paroxysmal atrial fibrillation, pacemaker implant in 2010 which is Medtronic device, essential hypertension, benign essential hypertension, right renal cell carcinoma status post 3 ablation in April 2022.  She was referred back to [place] because of shortness of breath as well as weight gain.  She said any effort will bring shortness of breath even while talking to me she is short of breath.  Denies having proximal nocturnal dyspnea however she noticed swelling of lower extremities.  Denies have any chest pain tightness squeezing pressure burning chest.  Past Medical History:  Diagnosis Date   AICD (automatic cardioverter/defibrillator) present 2010   [place]   Atrial fibrillation ([place]) 01/27/2009   Centricity Description: BRADYCARDIA Qualifier: Diagnosis of  By: Darrick Meigs   Centricity Description: ATRIAL ARRHYTHMIAS Qualifier: Diagnosis of  By: Darrick Meigs     Atrial myxoma    s/p resection   ATRIAL MYXOMA 01/27/2009   Qualifier: Diagnosis of  By: Darrick Meigs     Chronic kidney disease    kidney tumor   Congestive heart failure ([place]) 06/27/2021   Discharge from left nipple 09/19/2015   Essential hypertension 07/20/2010   Qualifier: Diagnosis of  By: Rayann Heman, MD, [place]     Fibrocystic breast  changes of both breasts 09/19/2015   GERD 01/27/2009   Qualifier: Diagnosis of  By: Darrick Meigs     Hypertension    Neoplasm of uncertain behavior of skin 04/15/2018   Neurofibroma of back 03/10/2019   OSTEOPENIA 01/27/2009   Qualifier: Diagnosis of  By: Darrick Meigs     Paroxysmal atrial fibrillation ([place])    Renal benign neoplasm, right 09/28/2020   Right renal mass    Seasonal allergies 09/10/2020   nonproductive cough   SICK SINUS/ TACHY-BRADY SYNDROME 01/27/2009   Qualifier: Diagnosis of  By: Darrick Meigs     Unspecified open wound, right lower leg, sequela 04/16/2016   URI (upper respiratory infection) 09/07/2020   WITH COLD AND COUGH , TOOK 5 DAYS OF ANTIBIOTICS ALL ISSUES RESOLVED PER PT   [place] 06/13/2020   Wears glasses    Wears hearing aid in both ears     Past Surgical History:  Procedure Laterality Date   [place]  12/24/2008   Left -- Dr Roxy Manns   BREAST SURGERY  1 ON RIGHT 2 ON LEFT   BIOPSY LAST DONE 1973, AND X 2 1960'S   CHOLECYSTECTOMY  2010   LAPAPROSCIPIC   CYSTOSCOPY W/ URETERAL STENT PLACEMENT Right 09/27/2020   Procedure: CYSTOSCOPY WITH RETROGRADE PYELOGRAM/URETERAL STENT PLACEMENT;  Surgeon: Ceasar Mons, MD;  Location: [facility];  Service: Urology;  Laterality: Right;   IR RADIOLOGIST EVAL & MGMT  08/03/2020   IR RADIOLOGIST EVAL & MGMT  03/07/2021   PACEMAKER INSERTION  02/02/2009   PARTIAL HYSTERECTOMY  1973   STILL HAS OVARIES   RADIOLOGY WITH ANESTHESIA Right 09/28/2020   Procedure: IR WITH ANESTHESIA CT CRYOABLATION;  Surgeon: Criselda Peaches, MD;  Location: [facility];  Service: Anesthesiology;  Laterality: Right;   stapendectomy Bilateral 1970's    Current Medications: Current Meds  Medication Sig   acetaminophen (TYLENOL) 500 MG tablet Take 500 mg by mouth every 6 (six) hours as needed for mild pain.   famotidine (PEPCID) 20 MG tablet Take 20 mg by mouth 2  (two) times daily as needed for heartburn or indigestion.   furosemide (LASIX) 40 MG tablet Take 1 tablet (40 mg total) by mouth daily.   potassium chloride (KLOR-CON) 10 MEQ tablet Take 1 tablet (10 mEq total) by mouth daily.   [DISCONTINUED] cyclobenzaprine (FLEXERIL) 5 MG tablet Take 5 mg by mouth every 6 (six) hours as needed for muscle spasms.   [DISCONTINUED] polyethylene glycol (MIRALAX / GLYCOLAX) 17 g packet Take 17 g by mouth daily as needed for mild constipation.     Allergies:   Aspirin, Alendronate sodium, Amiodarone hcl, Bactrim [sulfamethoxazole-trimethoprim], Ibandronate sodium, Risedronate sodium, Statins, Vimovo [naproxen-esomeprazole mg], Xarelto [rivaroxaban], Metaxalone, Mobic [meloxicam], and Toprol xl [metoprolol]   Social History   Socioeconomic History   Marital status: Married    Spouse name: Not on file   Number of children: Not on file   Years of education: Not on file   Highest education level: Not on file  Occupational History   Occupation: retired Pharmacist, hospital  Tobacco Use   Smoking status: Former    Packs/day: 0.25    Years: 12.00    Pack years: 3.00    Types: Cigarettes    Quit date: 06/04/1988    Years since quitting: 33.0   Smokeless tobacco: Never  Vaping Use   Vaping Use: Never used  Substance and Sexual Activity   Alcohol use: Not Currently   Drug use: No   Sexual activity: Not on file  Other Topics Concern   Not on file  Social History Narrative   Lives in [place] Determinants of Health   Financial Resource Strain: Not on file  Food Insecurity: Not on file  Transportation Needs: Not on file  Physical Activity: Not on file  Stress: Not on file  Social Connections: Not on file     Family History: The patient's family history includes Coronary artery disease in an other family member; Dementia in her mother and another family member; Heart attack (age of onset: 71) in her father; Heart disease in her father; Pancreatic cancer in  her mother and another family member; Thyroid disease in her mother; Transient ischemic attack in her mother. ROS:   Please see the history of present illness.    All 14 point review of systems negative except as described per history of present illness.  EKGs/Labs/Other Studies Reviewed:    The following studies were reviewed today:   EKG:  EKG is  ordered today.  The ekg ordered today demonstrates atrial flutter with ventricular rate of 124.  Nonspecific ST segment changes  Recent Labs: 10/02/2020: ALT 27; BUN 13; Creatinine, Ser 0.80; Hemoglobin 14.5; Platelets 254; Potassium 3.4; Sodium 141  Recent Lipid Panel    Component Value Date/Time   CHOL  12/22/2008 0150    146        ATP III CLASSIFICATION:  <200     mg/dL   Desirable  200-239  mg/dL   Borderline High  >=240    mg/dL   High          TRIG 83 12/22/2008 0150   HDL 36 (L) 12/22/2008 0150   CHOLHDL 4.1 12/22/2008 0150   VLDL 17 12/22/2008 0150   LDLCALC  12/22/2008 0150    93        Total Cholesterol/HDL:CHD Risk Coronary Heart Disease Risk Table                     Men   Women  1/2 Average Risk   3.4   3.3  Average Risk       5.0   4.4  2 X Average Risk   9.6   7.1  3 X Average Risk  23.4   11.0        Use the calculated Patient Ratio above and the CHD Risk Table to determine the patient's CHD Risk.        ATP III CLASSIFICATION (LDL):  <100     mg/dL   Optimal  100-129  mg/dL   Near or Above                    Optimal  130-159  mg/dL   Borderline  160-189  mg/dL   High  >190     mg/dL   Very High    Physical Exam:    VS:  BP 134/76 (BP Location: Right Arm, Patient Position: Sitting)    Pulse 82    Ht 5' 5"  (1.651 m)    Wt 133 lb 9.6 oz (60.6 kg)    SpO2 90%    BMI 22.23 kg/m     Wt Readings from Last 3 Encounters:  06/27/21 133 lb 9.6 oz (60.6 kg)  05/22/21 128 lb (58.1 kg)  11/18/20 133 lb 3.2 oz (60.4 kg)     GEN:  Well nourished, well developed in no acute distress HEENT: Normal NECK: JVD is  enlarged, LYMPHATICS: No lymphadenopathy CARDIAC: RRR, no murmurs, no rubs, no gallops RESPIRATORY:  Clear to auscultation without rales, wheezing or rhonchi  ABDOMEN: Soft, non-tender, non-distended MUSCULOSKELETAL: 1+ swelling lower extremities SKIN: Warm and dry NEUROLOGIC:  Alert and oriented x 3 PSYCHIATRIC:  Normal affect   ASSESSMENT:    1. Discharge from left nipple   2. SICK SINUS/ TACHY-BRADY SYNDROME   3. Acute combined systolic and diastolic congestive heart failure ([place])   4. Essential hypertension   5. Paroxysmal atrial fibrillation (HCC)    PLAN:    In order of problems listed above:  Decompensated CHF.  I will give her Lasix 40 mg with potassium 10 mEq, she will have echocardiogram done today.  She will have Chem-7 as well as proBNP checked today.  She was taking anticoagulation before however that being withdrawn because of family history of GI bleeding I will check CBC as well as stool for guaiac if that is negative then will initiate anticoagulation. Paroxysmal atrial fibrillation today she is in atypical atrial flutter plan as described above. Essential hypertension blood pressure well controlled slightly on the higher side which is actually good in this clinical scenario since she required aggressive management of her congestive heart failure.  Of course everything will depends on her left ventricle ejection fraction we will check today.  Pacemaker present interrogation reviewed.  Normal function This is Medtronic device and device was interrogated last time in August 2022 I want her if all the maneuvers we doing will not improve her shortness of breath or if shortness of breath gets worse she needs to go to the emergency room.  Medication Adjustments/Labs and Tests Ordered: Current medicines are reviewed at length with the patient today.  Concerns regarding medicines are outlined above.  Orders Placed This Encounter  Procedures   CBC   Comprehensive Metabolic  Panel (CMET)   Pro b natriuretic peptide (BNP)[facility])   EKG 12-Lead   Meds ordered this encounter  Medications   furosemide (LASIX) 40 MG tablet    Sig: Take 1 tablet (40 mg total) by mouth daily.    Dispense:  90 tablet    Refill:  3   potassium chloride (KLOR-CON) 10 MEQ tablet    Sig: Take 1 tablet (10 mEq total) by mouth daily.    Dispense:  90 tablet    Refill:  3    Signed, Park Liter, MD, [facility]. 06/27/2021 4:22 PM    [place]

## 2021-06-28 LAB — CBC
Hematocrit: 38.9 % (ref 34.0–46.6)
Hemoglobin: 12.5 g/dL (ref 11.1–15.9)
MCH: 28.3 pg (ref 26.6–33.0)
MCHC: 32.1 g/dL (ref 31.5–35.7)
MCV: 88 fL (ref 79–97)
Platelets: 260 10*3/uL (ref 150–450)
RBC: 4.42 x10E6/uL (ref 3.77–5.28)
RDW: 12.9 % (ref 11.7–15.4)
WBC: 5 10*3/uL (ref 3.4–10.8)

## 2021-06-28 LAB — COMPREHENSIVE METABOLIC PANEL
ALT: 12 IU/L (ref 0–32)
AST: 27 IU/L (ref 0–40)
Albumin/Globulin Ratio: 2.1 (ref 1.2–2.2)
Albumin: 4.2 g/dL (ref 3.6–4.6)
Alkaline Phosphatase: 56 IU/L (ref 44–121)
BUN/Creatinine Ratio: 15 (ref 12–28)
BUN: 12 mg/dL (ref 8–27)
Bilirubin Total: 1.3 mg/dL — ABNORMAL HIGH (ref 0.0–1.2)
CO2: 23 mmol/L (ref 20–29)
Calcium: 9.6 mg/dL (ref 8.7–10.3)
Chloride: 101 mmol/L (ref 96–106)
Creatinine, Ser: 0.79 mg/dL (ref 0.57–1.00)
Globulin, Total: 2 g/dL (ref 1.5–4.5)
Glucose: 99 mg/dL (ref 70–99)
Potassium: 3.9 mmol/L (ref 3.5–5.2)
Sodium: 140 mmol/L (ref 134–144)
Total Protein: 6.2 g/dL (ref 6.0–8.5)
eGFR: 73 mL/min/{1.73_m2} (ref >59)

## 2021-06-28 LAB — PRO B NATRIURETIC PEPTIDE: NT-Pro BNP: 1361 pg/mL — ABNORMAL HIGH (ref 0–738)

## 2021-06-29 LAB — ECHOCARDIOGRAM COMPLETE
Area-P 1/2: 5.84 cm2
Height: 65 in
MV M vel: 5.04 m/s
MV Peak grad: 101.6 mmHg
S' Lateral: 3.6 cm
Weight: 2137.6 oz

## 2021-07-03 ENCOUNTER — Ambulatory Visit: Payer: Medicare PPO | Admitting: Cardiology

## 2021-07-03 ENCOUNTER — Telehealth: Payer: Self-pay | Admitting: Cardiology

## 2021-07-03 NOTE — Telephone Encounter (Signed)
Patient calling to see if her appt today can be switch till tomorrow because she not feeling that great. Please advise

## 2021-07-03 NOTE — Telephone Encounter (Signed)
Pt states the sx she has now are upper and lower gi. Pt states that she feels better with her breathing and swelling. Advised to callback with any changes. Appointment has been rescheduled for 07/14/21.

## 2021-07-03 NOTE — Telephone Encounter (Signed)
Spoke with pt who states that she is not feeling well and would like to reschedule her appointment. You do not have any openings until the end of February. Pt states she recently started on Lasix and potassium and the Lasix made her feel funny so she has cut the dose in half. How do you advise?

## 2021-07-05 ENCOUNTER — Telehealth: Payer: Self-pay | Admitting: Cardiology

## 2021-07-05 ENCOUNTER — Telehealth: Payer: Self-pay

## 2021-07-05 ENCOUNTER — Ambulatory Visit (INDEPENDENT_AMBULATORY_CARE_PROVIDER_SITE_OTHER): Payer: Medicare PPO

## 2021-07-05 DIAGNOSIS — I495 Sick sinus syndrome: Secondary | ICD-10-CM

## 2021-07-05 NOTE — Telephone Encounter (Signed)
Advised patient that transmission has not been received.  Patient says she has tried to send report twice today.  Provided her with number for medtronic to troubleshoot why her monitor is not working.

## 2021-07-05 NOTE — Telephone Encounter (Signed)
-----   Message from Park Liter, MD sent at 06/30/2021 10:42 AM EST ----- She does have cardiomyopathy with ejection fraction 30 to 35%.  I did notify her when she was having test done.  I went to the room and talk to her so no need to call her

## 2021-07-05 NOTE — Telephone Encounter (Signed)
-----   Message from Park Liter, MD sent at 06/30/2021 10:29 AM EST ----- Laboratory test revealed decompensated congestive heart failure.  Please call her today to ask her how she is doing we give her diuretic last time I hope that she feels better next week Monday or Tuesday she need to have Chem-7 repeated

## 2021-07-05 NOTE — Telephone Encounter (Signed)
°  1. Has your device fired? no  2. Is you device beeping? no  3. Are you experiencing draining or swelling at device site?   4. Are you calling to see if we received your device transmission? yes  5. Have you passed out? no    Please route to [place]

## 2021-07-05 NOTE — Telephone Encounter (Signed)
Patient was notified of results.  

## 2021-07-06 DIAGNOSIS — I480 Paroxysmal atrial fibrillation: Secondary | ICD-10-CM | POA: Diagnosis not present

## 2021-07-06 DIAGNOSIS — Z86018 Personal history of other benign neoplasm: Secondary | ICD-10-CM | POA: Diagnosis not present

## 2021-07-06 DIAGNOSIS — I495 Sick sinus syndrome: Secondary | ICD-10-CM | POA: Diagnosis not present

## 2021-07-06 DIAGNOSIS — E876 Hypokalemia: Secondary | ICD-10-CM | POA: Diagnosis not present

## 2021-07-06 DIAGNOSIS — D49519 Neoplasm of unspecified behavior of unspecified kidney: Secondary | ICD-10-CM | POA: Diagnosis not present

## 2021-07-06 DIAGNOSIS — I4510 Unspecified right bundle-branch block: Secondary | ICD-10-CM | POA: Diagnosis not present

## 2021-07-06 DIAGNOSIS — R6889 Other general symptoms and signs: Secondary | ICD-10-CM | POA: Diagnosis not present

## 2021-07-06 DIAGNOSIS — I4892 Unspecified atrial flutter: Secondary | ICD-10-CM | POA: Diagnosis present

## 2021-07-06 DIAGNOSIS — N179 Acute kidney failure, unspecified: Secondary | ICD-10-CM | POA: Diagnosis not present

## 2021-07-06 DIAGNOSIS — Z79899 Other long term (current) drug therapy: Secondary | ICD-10-CM | POA: Diagnosis not present

## 2021-07-06 DIAGNOSIS — I5043 Acute on chronic combined systolic (congestive) and diastolic (congestive) heart failure: Secondary | ICD-10-CM | POA: Diagnosis not present

## 2021-07-06 DIAGNOSIS — I499 Cardiac arrhythmia, unspecified: Secondary | ICD-10-CM | POA: Diagnosis not present

## 2021-07-06 DIAGNOSIS — I5023 Acute on chronic systolic (congestive) heart failure: Secondary | ICD-10-CM | POA: Diagnosis not present

## 2021-07-06 DIAGNOSIS — Z95810 Presence of automatic (implantable) cardiac defibrillator: Secondary | ICD-10-CM | POA: Diagnosis not present

## 2021-07-06 DIAGNOSIS — Z90711 Acquired absence of uterus with remaining cervical stump: Secondary | ICD-10-CM | POA: Diagnosis not present

## 2021-07-06 DIAGNOSIS — I5020 Unspecified systolic (congestive) heart failure: Secondary | ICD-10-CM | POA: Diagnosis not present

## 2021-07-06 DIAGNOSIS — Z950 Presence of cardiac pacemaker: Secondary | ICD-10-CM | POA: Diagnosis not present

## 2021-07-06 DIAGNOSIS — Z48813 Encounter for surgical aftercare following surgery on the respiratory system: Secondary | ICD-10-CM | POA: Diagnosis not present

## 2021-07-06 DIAGNOSIS — R002 Palpitations: Secondary | ICD-10-CM | POA: Diagnosis not present

## 2021-07-06 DIAGNOSIS — I361 Nonrheumatic tricuspid (valve) insufficiency: Secondary | ICD-10-CM | POA: Diagnosis not present

## 2021-07-06 DIAGNOSIS — R0602 Shortness of breath: Secondary | ICD-10-CM | POA: Diagnosis not present

## 2021-07-06 DIAGNOSIS — J918 Pleural effusion in other conditions classified elsewhere: Secondary | ICD-10-CM | POA: Diagnosis not present

## 2021-07-06 DIAGNOSIS — J90 Pleural effusion, not elsewhere classified: Secondary | ICD-10-CM | POA: Diagnosis not present

## 2021-07-06 DIAGNOSIS — Z20822 Contact with and (suspected) exposure to covid-19: Secondary | ICD-10-CM | POA: Diagnosis not present

## 2021-07-06 DIAGNOSIS — R0902 Hypoxemia: Secondary | ICD-10-CM | POA: Diagnosis not present

## 2021-07-06 DIAGNOSIS — Z886 Allergy status to analgesic agent status: Secondary | ICD-10-CM | POA: Diagnosis not present

## 2021-07-06 DIAGNOSIS — I110 Hypertensive heart disease with heart failure: Secondary | ICD-10-CM | POA: Diagnosis not present

## 2021-07-06 DIAGNOSIS — I429 Cardiomyopathy, unspecified: Secondary | ICD-10-CM | POA: Diagnosis not present

## 2021-07-06 DIAGNOSIS — I4729 Other ventricular tachycardia: Secondary | ICD-10-CM | POA: Diagnosis not present

## 2021-07-06 DIAGNOSIS — M1990 Unspecified osteoarthritis, unspecified site: Secondary | ICD-10-CM | POA: Diagnosis not present

## 2021-07-06 DIAGNOSIS — E785 Hyperlipidemia, unspecified: Secondary | ICD-10-CM | POA: Diagnosis not present

## 2021-07-06 DIAGNOSIS — Z743 Need for continuous supervision: Secondary | ICD-10-CM | POA: Diagnosis not present

## 2021-07-06 DIAGNOSIS — I959 Hypotension, unspecified: Secondary | ICD-10-CM | POA: Diagnosis not present

## 2021-07-06 DIAGNOSIS — I509 Heart failure, unspecified: Secondary | ICD-10-CM | POA: Diagnosis not present

## 2021-07-06 DIAGNOSIS — E7800 Pure hypercholesterolemia, unspecified: Secondary | ICD-10-CM | POA: Diagnosis not present

## 2021-07-06 DIAGNOSIS — Z974 Presence of external hearing-aid: Secondary | ICD-10-CM | POA: Diagnosis not present

## 2021-07-06 DIAGNOSIS — M8580 Other specified disorders of bone density and structure, unspecified site: Secondary | ICD-10-CM | POA: Diagnosis not present

## 2021-07-06 DIAGNOSIS — D219 Benign neoplasm of connective and other soft tissue, unspecified: Secondary | ICD-10-CM | POA: Diagnosis not present

## 2021-07-06 DIAGNOSIS — R918 Other nonspecific abnormal finding of lung field: Secondary | ICD-10-CM | POA: Diagnosis not present

## 2021-07-06 DIAGNOSIS — Z9049 Acquired absence of other specified parts of digestive tract: Secondary | ICD-10-CM | POA: Diagnosis not present

## 2021-07-06 DIAGNOSIS — K219 Gastro-esophageal reflux disease without esophagitis: Secondary | ICD-10-CM | POA: Diagnosis not present

## 2021-07-06 DIAGNOSIS — I340 Nonrheumatic mitral (valve) insufficiency: Secondary | ICD-10-CM | POA: Diagnosis not present

## 2021-07-06 DIAGNOSIS — R531 Weakness: Secondary | ICD-10-CM | POA: Diagnosis not present

## 2021-07-06 DIAGNOSIS — I4891 Unspecified atrial fibrillation: Secondary | ICD-10-CM | POA: Diagnosis not present

## 2021-07-06 DIAGNOSIS — J302 Other seasonal allergic rhinitis: Secondary | ICD-10-CM | POA: Diagnosis not present

## 2021-07-06 DIAGNOSIS — Z888 Allergy status to other drugs, medicaments and biological substances status: Secondary | ICD-10-CM | POA: Diagnosis not present

## 2021-07-06 DIAGNOSIS — J9601 Acute respiratory failure with hypoxia: Secondary | ICD-10-CM | POA: Diagnosis not present

## 2021-07-06 LAB — CUP PACEART REMOTE DEVICE CHECK
Battery Impedance: 2179 Ohm
Battery Remaining Longevity: 32 mo
Battery Voltage: 2.75 V
Brady Statistic AP VP Percent: 0 %
Brady Statistic AP VS Percent: 62 %
Brady Statistic AS VP Percent: 0 %
Brady Statistic AS VS Percent: 38 %
Date Time Interrogation Session: 20230201163719
Implantable Lead Implant Date: 20100729
Implantable Lead Implant Date: 20100729
Implantable Lead Location: 753859
Implantable Lead Location: 753860
Implantable Lead Model: 5076
Implantable Lead Model: 5076
Implantable Pulse Generator Implant Date: 20100729
Lead Channel Impedance Value: 431 Ohm
Lead Channel Impedance Value: 553 Ohm
Lead Channel Pacing Threshold Amplitude: 0.5 V
Lead Channel Pacing Threshold Amplitude: 0.875 V
Lead Channel Pacing Threshold Pulse Width: 0.4 ms
Lead Channel Pacing Threshold Pulse Width: 0.4 ms
Lead Channel Setting Pacing Amplitude: 2 V
Lead Channel Setting Pacing Amplitude: 2.5 V
Lead Channel Setting Pacing Pulse Width: 0.4 ms
Lead Channel Setting Sensing Sensitivity: 2.8 mV

## 2021-07-06 NOTE — Telephone Encounter (Signed)
Transmission received and reviewed. Encounter in paceart.

## 2021-07-07 DIAGNOSIS — I429 Cardiomyopathy, unspecified: Secondary | ICD-10-CM | POA: Diagnosis not present

## 2021-07-07 DIAGNOSIS — J90 Pleural effusion, not elsewhere classified: Secondary | ICD-10-CM | POA: Diagnosis not present

## 2021-07-07 DIAGNOSIS — Z950 Presence of cardiac pacemaker: Secondary | ICD-10-CM | POA: Diagnosis not present

## 2021-07-07 DIAGNOSIS — Z86018 Personal history of other benign neoplasm: Secondary | ICD-10-CM | POA: Diagnosis not present

## 2021-07-07 NOTE — Telephone Encounter (Signed)
Left a message to return my call.

## 2021-07-08 DIAGNOSIS — I429 Cardiomyopathy, unspecified: Secondary | ICD-10-CM

## 2021-07-08 DIAGNOSIS — Z950 Presence of cardiac pacemaker: Secondary | ICD-10-CM

## 2021-07-08 DIAGNOSIS — I4892 Unspecified atrial flutter: Secondary | ICD-10-CM

## 2021-07-08 DIAGNOSIS — D219 Benign neoplasm of connective and other soft tissue, unspecified: Secondary | ICD-10-CM

## 2021-07-08 DIAGNOSIS — I509 Heart failure, unspecified: Secondary | ICD-10-CM

## 2021-07-09 ENCOUNTER — Inpatient Hospital Stay (HOSPITAL_COMMUNITY)
Admission: AD | Admit: 2021-07-09 | Discharge: 2021-07-12 | DRG: 308 | Disposition: A | Discharge status: Home / Self Care | Payer: Medicare PPO | Source: Other Acute Inpatient Hospital | Attending: Internal Medicine | Admitting: Internal Medicine

## 2021-07-09 ENCOUNTER — Other Ambulatory Visit: Payer: Self-pay

## 2021-07-09 DIAGNOSIS — I5021 Acute systolic (congestive) heart failure: Secondary | ICD-10-CM | POA: Diagnosis present

## 2021-07-09 DIAGNOSIS — Z886 Allergy status to analgesic agent status: Secondary | ICD-10-CM

## 2021-07-09 DIAGNOSIS — D49519 Neoplasm of unspecified behavior of unspecified kidney: Secondary | ICD-10-CM | POA: Diagnosis present

## 2021-07-09 DIAGNOSIS — N179 Acute kidney failure, unspecified: Secondary | ICD-10-CM | POA: Diagnosis not present

## 2021-07-09 DIAGNOSIS — J302 Other seasonal allergic rhinitis: Secondary | ICD-10-CM | POA: Diagnosis present

## 2021-07-09 DIAGNOSIS — Z974 Presence of external hearing-aid: Secondary | ICD-10-CM | POA: Diagnosis not present

## 2021-07-09 DIAGNOSIS — I340 Nonrheumatic mitral (valve) insufficiency: Secondary | ICD-10-CM | POA: Diagnosis not present

## 2021-07-09 DIAGNOSIS — Z90711 Acquired absence of uterus with remaining cervical stump: Secondary | ICD-10-CM | POA: Diagnosis not present

## 2021-07-09 DIAGNOSIS — Z95810 Presence of automatic (implantable) cardiac defibrillator: Secondary | ICD-10-CM | POA: Diagnosis not present

## 2021-07-09 DIAGNOSIS — I5043 Acute on chronic combined systolic (congestive) and diastolic (congestive) heart failure: Secondary | ICD-10-CM | POA: Diagnosis present

## 2021-07-09 DIAGNOSIS — J90 Pleural effusion, not elsewhere classified: Secondary | ICD-10-CM

## 2021-07-09 DIAGNOSIS — K219 Gastro-esophageal reflux disease without esophagitis: Secondary | ICD-10-CM | POA: Diagnosis present

## 2021-07-09 DIAGNOSIS — R531 Weakness: Secondary | ICD-10-CM | POA: Diagnosis not present

## 2021-07-09 DIAGNOSIS — I110 Hypertensive heart disease with heart failure: Secondary | ICD-10-CM | POA: Diagnosis not present

## 2021-07-09 DIAGNOSIS — E785 Hyperlipidemia, unspecified: Secondary | ICD-10-CM | POA: Diagnosis present

## 2021-07-09 DIAGNOSIS — Z9049 Acquired absence of other specified parts of digestive tract: Secondary | ICD-10-CM | POA: Diagnosis not present

## 2021-07-09 DIAGNOSIS — I5020 Unspecified systolic (congestive) heart failure: Principal | ICD-10-CM

## 2021-07-09 DIAGNOSIS — Z79899 Other long term (current) drug therapy: Secondary | ICD-10-CM

## 2021-07-09 DIAGNOSIS — I429 Cardiomyopathy, unspecified: Secondary | ICD-10-CM | POA: Diagnosis not present

## 2021-07-09 DIAGNOSIS — I4892 Unspecified atrial flutter: Principal | ICD-10-CM

## 2021-07-09 DIAGNOSIS — M8580 Other specified disorders of bone density and structure, unspecified site: Secondary | ICD-10-CM | POA: Diagnosis not present

## 2021-07-09 DIAGNOSIS — I509 Heart failure, unspecified: Secondary | ICD-10-CM

## 2021-07-09 DIAGNOSIS — I495 Sick sinus syndrome: Secondary | ICD-10-CM | POA: Diagnosis not present

## 2021-07-09 DIAGNOSIS — Z20822 Contact with and (suspected) exposure to covid-19: Secondary | ICD-10-CM | POA: Diagnosis not present

## 2021-07-09 DIAGNOSIS — I959 Hypotension, unspecified: Secondary | ICD-10-CM | POA: Diagnosis not present

## 2021-07-09 DIAGNOSIS — I4891 Unspecified atrial fibrillation: Secondary | ICD-10-CM | POA: Diagnosis not present

## 2021-07-09 DIAGNOSIS — Z950 Presence of cardiac pacemaker: Secondary | ICD-10-CM | POA: Diagnosis not present

## 2021-07-09 DIAGNOSIS — I480 Paroxysmal atrial fibrillation: Secondary | ICD-10-CM | POA: Diagnosis present

## 2021-07-09 DIAGNOSIS — R0602 Shortness of breath: Secondary | ICD-10-CM | POA: Diagnosis not present

## 2021-07-09 DIAGNOSIS — J811 Chronic pulmonary edema: Secondary | ICD-10-CM | POA: Diagnosis not present

## 2021-07-09 DIAGNOSIS — J810 Acute pulmonary edema: Secondary | ICD-10-CM | POA: Insufficient documentation

## 2021-07-09 DIAGNOSIS — I361 Nonrheumatic tricuspid (valve) insufficiency: Secondary | ICD-10-CM | POA: Diagnosis not present

## 2021-07-09 DIAGNOSIS — Z888 Allergy status to other drugs, medicaments and biological substances status: Secondary | ICD-10-CM

## 2021-07-09 DIAGNOSIS — N289 Disorder of kidney and ureter, unspecified: Secondary | ICD-10-CM | POA: Diagnosis not present

## 2021-07-09 HISTORY — DX: Acute systolic (congestive) heart failure: I50.21

## 2021-07-09 LAB — CBC WITH DIFFERENTIAL/PLATELET
Abs Immature Granulocytes: 0.03 10*3/uL (ref 0.00–0.07)
Basophils Absolute: 0 10*3/uL (ref 0.0–0.1)
Basophils Relative: 0 %
Eosinophils Absolute: 0.1 10*3/uL (ref 0.0–0.5)
Eosinophils Relative: 3 %
HCT: 40.3 % (ref 36.0–46.0)
Hemoglobin: 12 g/dL (ref 12.0–15.0)
Immature Granulocytes: 1 %
Lymphocytes Relative: 28 %
Lymphs Abs: 1.2 10*3/uL (ref 0.7–4.0)
MCH: 28.4 pg (ref 26.0–34.0)
MCHC: 29.8 g/dL — ABNORMAL LOW (ref 30.0–36.0)
MCV: 95.5 fL (ref 80.0–100.0)
Monocytes Absolute: 0.8 10*3/uL (ref 0.1–1.0)
Monocytes Relative: 18 %
Neutro Abs: 2.3 10*3/uL (ref 1.7–7.7)
Neutrophils Relative %: 50 %
Platelets: 186 10*3/uL (ref 150–400)
RBC: 4.22 MIL/uL (ref 3.87–5.11)
RDW: 14 % (ref 11.5–15.5)
WBC: 4.5 10*3/uL (ref 4.0–10.5)
nRBC: 0 % (ref 0.0–0.2)

## 2021-07-09 LAB — RESP PANEL BY RT-PCR (FLU A&B, COVID) ARPGX2
Influenza A by PCR: NEGATIVE
Influenza B by PCR: NEGATIVE
SARS Coronavirus 2 by RT PCR: NEGATIVE

## 2021-07-09 LAB — COMPREHENSIVE METABOLIC PANEL
ALT: 17 U/L (ref 0–44)
AST: 29 U/L (ref 15–41)
Albumin: 3.4 g/dL — ABNORMAL LOW (ref 3.5–5.0)
Alkaline Phosphatase: 44 U/L (ref 38–126)
Anion gap: 11 (ref 5–15)
BUN: 12 mg/dL (ref 8–23)
CO2: 24 mmol/L (ref 22–32)
Calcium: 8.9 mg/dL (ref 8.9–10.3)
Chloride: 102 mmol/L (ref 98–111)
Creatinine, Ser: 1.19 mg/dL — ABNORMAL HIGH (ref 0.44–1.00)
GFR, Estimated: 45 mL/min — ABNORMAL LOW (ref >60)
Glucose, Bld: 81 mg/dL (ref 70–99)
Potassium: 4.5 mmol/L (ref 3.5–5.1)
Sodium: 137 mmol/L (ref 135–145)
Total Bilirubin: 0.9 mg/dL (ref 0.3–1.2)
Total Protein: 6 g/dL — ABNORMAL LOW (ref 6.5–8.1)

## 2021-07-09 LAB — MAGNESIUM: Magnesium: 1.8 mg/dL (ref 1.7–2.4)

## 2021-07-09 LAB — BRAIN NATRIURETIC PEPTIDE: B Natriuretic Peptide: 133.5 pg/mL — ABNORMAL HIGH (ref 0.0–100.0)

## 2021-07-09 MED ORDER — SODIUM CHLORIDE 0.9% FLUSH: 3.0000 mL | INTRAVENOUS | Status: DC | PRN

## 2021-07-09 MED ORDER — SODIUM CHLORIDE 0.9% FLUSH
3.0000 mL | Freq: Two times a day (BID) | INTRAVENOUS | Status: DC
  Administered 2021-07-09 – 2021-07-12 (×6): 3 mL via INTRAVENOUS

## 2021-07-09 MED ORDER — APIXABAN 2.5 MG PO TABS
2.5000 mg | ORAL_TABLET | Freq: Two times a day (BID) | ORAL | Status: DC
  Administered 2021-07-09 – 2021-07-12 (×6): 2.5 mg via ORAL
  Filled 2021-07-09 (×6): qty 1

## 2021-07-09 MED ORDER — FUROSEMIDE 20 MG PO TABS
20.0000 mg | ORAL_TABLET | Freq: Every day | ORAL | Status: DC
  Filled 2021-07-09: qty 1

## 2021-07-09 MED ORDER — ONDANSETRON HCL 4 MG/2ML IJ SOLN
4.0000 mg | Freq: Four times a day (QID) | INTRAMUSCULAR | Status: DC | PRN
  Administered 2021-07-10 (×3): 4 mg via INTRAVENOUS
  Filled 2021-07-09 (×3): qty 2

## 2021-07-09 MED ORDER — ACETAMINOPHEN 325 MG PO TABS
650.0000 mg | ORAL_TABLET | ORAL | Status: DC | PRN
  Administered 2021-07-10: 05:00:00 650 mg via ORAL
  Filled 2021-07-09: qty 2

## 2021-07-09 MED ORDER — FUROSEMIDE 10 MG/ML IJ SOLN
40.0000 mg | Freq: Every day | INTRAMUSCULAR | Status: AC
  Administered 2021-07-09 – 2021-07-10 (×2): 40 mg via INTRAVENOUS
  Filled 2021-07-09 (×2): qty 4

## 2021-07-09 MED ORDER — SODIUM CHLORIDE 0.9 % IV SOLN: 250.0000 mL | INTRAVENOUS | Status: DC | PRN

## 2021-07-09 MED ORDER — METOPROLOL SUCCINATE ER 25 MG PO TB24
12.5000 mg | ORAL_TABLET | Freq: Every day | ORAL | Status: DC
  Administered 2021-07-09 – 2021-07-12 (×4): 12.5 mg via ORAL
  Filled 2021-07-09 (×4): qty 1

## 2021-07-09 MED ORDER — MAGNESIUM OXIDE -MG SUPPLEMENT 400 (240 MG) MG PO TABS
400.0000 mg | ORAL_TABLET | Freq: Every day | ORAL | Status: AC
  Administered 2021-07-09 – 2021-07-11 (×3): 400 mg via ORAL
  Filled 2021-07-09 (×3): qty 1

## 2021-07-09 NOTE — H&P (Signed)
Cardiology Admission History and Physical:   Patient ID: Cassandra Romero MRN: [id number]; DOB: Mar 13, 1933   Admission date: (Not on file)  PCP:  Cassandra Flow, MD   [facility] Providers Cardiologist:  Cassandra Campus, MD  EP: Dr. Rayann Romero  Chief Complaint: Atrial Flutter with RVR and CHF  Patient Profile:   Cassandra Romero is a 88 y.o. female with past medical history of atrial myxoma (s/p resection in 2010), paroxysmal atrial fibrillation complicated by tachy-brady syndrome (s/p Medtronic PPM placement in 2010), HTN, HLD and newly diagnosed atrial flutter with RVR and HFrEF (diagnosed in 06/2021 and EF 30-35% by echo) who is being seen 07/09/2021 for the evaluation of CHF and atrial flutter with RVR as a transfer from [facility].  History of Present Illness:   Cassandra Romero was recently examined by Dr. Agustin Romero in 06/2021 and reported worsening dyspnea on exertion with associated weight gain.  He was noted to be tachycardic during her visit and repeat EKG showed atrial flutter with RVR, heart rate 127. She was started on Lasix 40 mg daily along with K-Dur 10 milliequivalents daily. An echocardiogram was obtained on 06/29/2021 which showed a reduced EF of 30 to 35% with RV function moderately reduced. She did have mild MR and mild to moderate TR.  By review of her paper chart, she presented to [facility] on 07/06/2021 for evaluation of worsening palpitations and shortness of breath. Initial labs showed WBC 4.9, Hgb 12.2, platelets 163, Na+ 136, K+ 3.2 and creatinine 0.70.  proBNP was elevated at 1650. Initial troponin I negative at 0.03. Underwent CTA which showed no evidence of coronary calcifications and no evidence of a PE. She was noted to have a large left pleural effusion along with scattered scarring along the lungs bilaterally. She underwent thoracentesis with 400 mL of fluid removed.  She was started on IV Cardizem for rate-control but by review of notes developed  hypotension with this and was intermittently started on Dobutamine. Was also started on IV Heparin for anticoagulation. Was also thought to have a UTI on admission and started on IV Rocephin but urine culture was negative, therefore this was discontinued.  As of 07/08/2021, her creatinine had trended up to 1.40.  In talking with the patient today, she reports her breathing has improved from admission. Reports occasional palpitations. No chest pain. Lower extremity edema has resolved as well. Says she was told the TEE probe was broken at [facility], therefore she needed to be transferred here for TEE/DCCV. Remains on Heparin for anticoagulation. In reviewing her paper information, it is unclear which rate-controlling medications she received prior to transfer.  Telemetry here shows 2:1 Atrial flutter. EKG pending  EKG 1/25 shows 2:1 atrial flutter, incomplete rbb  Past Medical History:  Diagnosis Date   AICD (automatic cardioverter/defibrillator) present 2010   [place]   Atrial fibrillation ([place]) 01/27/2009   Centricity Description: BRADYCARDIA Qualifier: Diagnosis of  By: Darrick Meigs   Centricity Description: ATRIAL ARRHYTHMIAS Qualifier: Diagnosis of  By: Darrick Meigs     Atrial myxoma    s/p resection   ATRIAL MYXOMA 01/27/2009   Qualifier: Diagnosis of  By: Darrick Meigs     Chronic kidney disease    kidney tumor   Congestive heart failure ([place]) 06/27/2021   Essential hypertension 07/20/2010   Qualifier: Diagnosis of  By: Cassandra Heman, MD, James     Fibrocystic breast changes of both breasts 09/19/2015   GERD 01/27/2009   Qualifier: Diagnosis of  By: Aundra Dubin,  Monica     Hypertension    Neoplasm of uncertain behavior of skin 04/15/2018   Neurofibroma of back 03/10/2019   OSTEOPENIA 01/27/2009   Qualifier: Diagnosis of  By: Darrick Meigs     Paroxysmal atrial fibrillation [facility])    Renal benign neoplasm, right 09/28/2020   Right renal mass    Seasonal allergies 09/10/2020    nonproductive cough   SICK SINUS/ TACHY-BRADY SYNDROME 01/27/2009   Qualifier: Diagnosis of  By: Darrick Meigs     Unspecified open wound, right lower leg, sequela 04/16/2016   URI (upper respiratory infection) 09/07/2020   WITH COLD AND COUGH , TOOK 5 DAYS OF ANTIBIOTICS ALL ISSUES RESOLVED PER PT   [place] 06/13/2020   Wears glasses    Wears hearing aid in both ears     Past Surgical History:  Procedure Laterality Date   [place]  12/24/2008   Left -- Dr Roxy Manns   BREAST SURGERY  1 ON RIGHT 2 ON LEFT   BIOPSY LAST DONE 1973, AND X 2 1960'S   CHOLECYSTECTOMY  2010   LAPAPROSCIPIC   CYSTOSCOPY W/ URETERAL STENT PLACEMENT Right 09/27/2020   Procedure: CYSTOSCOPY WITH RETROGRADE PYELOGRAM/URETERAL STENT PLACEMENT;  Surgeon: Ceasar Mons, MD;  Location: [facility];  Service: Urology;  Laterality: Right;   IR RADIOLOGIST EVAL & MGMT  08/03/2020   IR RADIOLOGIST EVAL & MGMT  03/07/2021   PACEMAKER INSERTION  02/02/2009   PARTIAL HYSTERECTOMY  1973   STILL HAS OVARIES   RADIOLOGY WITH ANESTHESIA Right 09/28/2020   Procedure: IR WITH ANESTHESIA CT CRYOABLATION;  Surgeon: Criselda Peaches, MD;  Location: [facility];  Service: Anesthesiology;  Laterality: Right;   stapendectomy Bilateral 1970's     Medications Prior to Admission: Prior to Admission medications   Medication Sig Start Date End Date Taking? Authorizing Provider  acetaminophen (TYLENOL) 500 MG tablet Take 500 mg by mouth every 6 (six) hours as needed for mild pain.    [provider]  famotidine (PEPCID) 20 MG tablet Take 20 mg by mouth 2 (two) times daily as needed for heartburn or indigestion.    [provider]  furosemide (LASIX) 40 MG tablet Take 1 tablet (40 mg total) by mouth daily. 06/27/21   Park Liter, MD  potassium chloride (KLOR-CON) 10 MEQ tablet Take 1 tablet (10 mEq total) by mouth daily. 06/27/21   Park Liter, MD     Allergies:    Allergies  Allergen Reactions   Aspirin     Makes her feel sick   Alendronate Sodium     REACTION: extreme fatigue/vision change   Amiodarone Hcl Nausea And Vomiting   Bactrim [Sulfamethoxazole-Trimethoprim] Other (See Comments)    Abdominal pain   Ibandronate Sodium     REACTION: extreme fatigue/vision changes   Risedronate Sodium     REACTION: extreme fatigue/vision   Statins Other (See Comments)    Myalgia   Vimovo [Naproxen-Esomeprazole Mg] Other (See Comments)    Leg Cramps   Xarelto [Rivaroxaban]     SEVERE ANEMIA   Metaxalone Rash   Mobic [Meloxicam] Rash   Toprol Xl [Metoprolol] Rash    Social History:   Social History   Socioeconomic History   Marital status: Married    Spouse name: Not on file   Number of children: Not on file   Years of education: Not on file   Highest education level: Not on  file  Occupational History   Occupation: retired Pharmacist, hospital  Tobacco Use   Smoking status: Former    Packs/day: 0.25    Years: 12.00    Pack years: 3.00    Types: Cigarettes    Quit date: 06/04/1988    Years since quitting: 33.1   Smokeless tobacco: Never  Vaping Use   Vaping Use: Never used  Substance and Sexual Activity   Alcohol use: Not Currently   Drug use: No   Sexual activity: Not on file  Other Topics Concern   Not on file  Social History Narrative   Lives in [place] Determinants of Health   Financial Resource Strain: Not on file  Food Insecurity: Not on file  Transportation Needs: Not on file  Physical Activity: Not on file  Stress: Not on file  Social Connections: Not on file  Intimate Partner Violence: Not on file    Family History:   The patient's family history includes Coronary artery disease in an other family member; Dementia in her mother and another family member; Heart attack (age of onset: 73) in her father; Heart disease in her father; Pancreatic cancer in her mother and another family member;  Thyroid disease in her mother; Transient ischemic attack in her mother.    ROS:  Please see the history of present illness.   All other ROS reviewed and negative.     Physical Exam/Data:  There were no vitals filed for this visit. No intake or output data in the 24 hours ending 07/09/21 1618 Last 3 Weights 06/27/2021 05/22/2021 11/18/2020  Weight (lbs) 133 lb 9.6 oz 128 lb 133 lb 3.2 oz  Weight (kg) 60.601 kg 58.06 kg 60.419 kg     There is no height or weight on file to calculate BMI.  General: Thin, elderly female appearing in no acute distress.  HEENT: normal Neck: JVD mid neck+ Vascular: No carotid bruits; Distal pulses 2+ bilaterally   Cardiac:  normal S1, S2; Irregularly irregular.  Lungs:  b/l crackles+ Abd: soft, nontender, no hepatomegaly  Ext: no pitting edema Musculoskeletal:  No deformities, BUE and BLE strength normal and equal Skin: warm and dry  Neuro:  CNs 2-12 intact, no focal abnormalities noted Psych:  Normal affect    EKG:  The ECG that was done 06/27/2021 was personally reviewed and demonstrates atrial flutter with RVR, HR 127.  Relevant CV Studies:  Echocardiogram: 06/29/2021 IMPRESSIONS     1. Resting tachycardia at 125 BPM, possible atrial flutter. Left  ventricular ejection fraction, by estimation, is 30 to 35%. The left  ventricle has moderate to severely decreased function. Left ventricular  endocardial border not optimally defined to  evaluate regional wall motion. Left ventricular diastolic parameters are  indeterminate.   2. RV > LV size. Right ventricular systolic function is moderately  reduced. The right ventricular size is mildly enlarged.   3. The mitral valve is normal in structure. Mild mitral valve  regurgitation. No evidence of mitral stenosis.   4. TR is not well defined, color doppler splay is noted      TR may be underestimated. Tricuspid valve regurgitation is mild to  moderate.   5. Tachycardia at 125 BPM. The aortic valve is  tricuspid. Aortic valve  regurgitation is not visualized. Aortic valve sclerosis is present, with  no evidence of aortic valve stenosis.   6. The inferior vena cava is dilated in size with <50% respiratory  variability, suggesting right atrial pressure of 15 mmHg.  Laboratory Data:  High Sensitivity Troponin:  No results for input(s): TROPONINIHS in the last 720 hours.    ChemistryNo results for input(s): NA, K, CL, CO2, GLUCOSE, BUN, CREATININE, CALCIUM, MG, GFRNONAA, GFRAA, ANIONGAP in the last 168 hours.  No results for input(s): PROT, ALBUMIN, AST, ALT, ALKPHOS, BILITOT in the last 168 hours. Lipids No results for input(s): CHOL, TRIG, HDL, LABVLDL, LDLCALC, CHOLHDL in the last 168 hours. HematologyNo results for input(s): WBC, RBC, HGB, HCT, MCV, MCH, MCHC, RDW, PLT in the last 168 hours. Thyroid No results for input(s): TSH, FREET4 in the last 168 hours. BNPNo results for input(s): BNP, PROBNP in the last 168 hours.  DDimer No results for input(s): DDIMER in the last 168 hours.   Radiology/Studies:  No results found.   Assessment and Plan:   Acute decompensated HFrEF, EF 30-35% NYHA class III symptoms 2:1 Atrial flutter RVR Not on anticoagulation H/o Tachy-brady syndrome s/p medtronic PPM 2010  3. Hypotension S/p Dobutamine for brief hypotension during IV diuresis and on IV diltiazem gtt- resolved. Currently warm and mildly volume up. 4. AKI due to hypotension (probably ATN) 5. H/o myxoma s/p resection in 2010 6. Arthritis  Plan: - get labs, ekg, cxr  - on exam has JVD midneck, b/l crackles, she is warm with good BP 130/70s - IV lasix 40mg  daily, goal UO 2lts - GDMT: start low dose metoprolol xl 12.5mg  daily (titrate up). She is asymptomatic and HR is regular at 120s (2:1 flutter): Plan for TEE DCCV on Tuesday. NPO Monday night  Once diuresis and AKI is better: plan on starting low dose valsartan/entresto, dapagliflozin and spironolactone (If tolerates, she is a small  elderly lady so may not tolerate traditional GDMT)  - start eliquis 2.5mg  BID for stroke prophylaxis- pt denies any recent bleeding history - telemetry - full code  Risk Assessment/Risk Scores:       New York Heart Association (NYHA) Functional Class NYHA Class III  CHA2DS2-VASc Score =   4  This indicates a  % annual risk of stroke. The patient's score is based upon:        Severity of Illness: The appropriate patient status for this patient is INPATIENT. Inpatient status is judged to be reasonable and necessary in order to provide the required intensity of service to ensure the patient's safety. The patient's presenting symptoms, physical exam findings, and initial radiographic and laboratory data in the context of their chronic comorbidities is felt to place them at high risk for further clinical deterioration. Furthermore, it is not anticipated that the patient will be medically stable for discharge from the hospital within 2 midnights of admission.   * I certify that at the point of admission it is my clinical judgment that the patient will require inpatient hospital care spanning beyond 2 midnights from the point of admission due to high intensity of service, high risk for further deterioration and high frequency of surveillance required.*   For questions or updates, please contact [place] Please consult [email or web address] for contact info under     Signed, Erma Heritage, PA-C  07/09/2021 4:18 PM

## 2021-07-09 NOTE — Progress Notes (Signed)
Eliquise given,Heparin d/c per pharmacy.

## 2021-07-09 NOTE — Progress Notes (Signed)
ANTICOAGULATION CONSULT NOTE - Initial Consult  Pharmacy Consult for apixaban Indication: atrial fibrillation  Allergies  Allergen Reactions   Aspirin     Makes her feel sick   Alendronate Sodium     REACTION: extreme fatigue/vision change   Amiodarone Hcl Nausea And Vomiting   Bactrim [Sulfamethoxazole-Trimethoprim] Other (See Comments)    Abdominal pain   Ibandronate Sodium     REACTION: extreme fatigue/vision changes   Risedronate Sodium     REACTION: extreme fatigue/vision   Statins Other (See Comments)    Myalgia   Vimovo [Naproxen-Esomeprazole Mg] Other (See Comments)    Leg Cramps   Xarelto [Rivaroxaban]     SEVERE ANEMIA   Metaxalone Rash   Mobic [Meloxicam] Rash   Toprol Xl [Metoprolol] Rash    Patient Measurements: Height: 5\' 5"  (165.1 cm) Weight: 58.5 kg (128 lb 15.5 oz) IBW/kg (Calculated) : 57   Vital Signs: Temp: 98 F (36.7 C) (02/05 1637) Temp Source: Oral (02/05 1637) BP: 125/96 (02/05 1712) Pulse Rate: 120 (02/05 1712)  Labs: No results for input(s): HGB, HCT, PLT, APTT, LABPROT, INR, HEPARINUNFRC, HEPRLOWMOCWT, CREATININE, CKTOTAL, CKMB, TROPONINIHS in the last 72 hours.  Estimated Creatinine Clearance: 45.4 mL/min (by C-G formula based on SCr of 0.79 mg/dL).   Medical History: Past Medical History:  Diagnosis Date   AICD (automatic cardioverter/defibrillator) present 2010   [place]   Atrial fibrillation ([place]) 01/27/2009   Centricity Description: BRADYCARDIA Qualifier: Diagnosis of  By: Darrick Meigs   Centricity Description: ATRIAL ARRHYTHMIAS Qualifier: Diagnosis of  By: Darrick Meigs     Atrial myxoma    s/p resection   ATRIAL MYXOMA 01/27/2009   Qualifier: Diagnosis of  By: Darrick Meigs     Chronic kidney disease    kidney tumor   Congestive heart failure ([place]) 06/27/2021   Essential hypertension 07/20/2010   Qualifier: Diagnosis of  By: Rayann Heman, MD, James     Fibrocystic breast changes of both breasts 09/19/2015   GERD  01/27/2009   Qualifier: Diagnosis of  By: Darrick Meigs     Hypertension    Neoplasm of uncertain behavior of skin 04/15/2018   Neurofibroma of back 03/10/2019   OSTEOPENIA 01/27/2009   Qualifier: Diagnosis of  By: Darrick Meigs     Paroxysmal atrial fibrillation [facility])    Renal benign neoplasm, right 09/28/2020   Right renal mass    Seasonal allergies 09/10/2020   nonproductive cough   SICK SINUS/ TACHY-BRADY SYNDROME 01/27/2009   Qualifier: Diagnosis of  By: Darrick Meigs     Unspecified open wound, right lower leg, sequela 04/16/2016   URI (upper respiratory infection) 09/07/2020   WITH COLD AND COUGH , TOOK 5 DAYS OF ANTIBIOTICS ALL ISSUES RESOLVED PER PT   [place] 06/13/2020   Wears glasses    Wears hearing aid in both ears      Assessment: 88 yo W with new atrial fibrillation. Pharmacy consulted for apixaban dosing. Given Age > 80 and weight < 60 kg, will start 2.5mg  BID.    Goal of Therapy:   Monitor platelets by anticoagulation protocol: Yes   Plan:  Apixaban 2.5mg  BID  Monitor for signs/symptoms of bleeding   Benetta Spar, PharmD, BCPS, BCCP Clinical Pharmacist  Please check AMION for all [place] phone numbers After 10:00 PM, call [place] [phone]

## 2021-07-09 NOTE — TOC Progression Note (Signed)
Transition of Care [facility]) - Progression Note    Patient Details  Name: Cassandra Romero MRN: [id number] Date of Birth: November 24, 1932  Transition of Care [facility]) CM/SW Contact  Zenon Mayo, RN Phone Number: 07/09/2021, 4:37 PM  Clinical Narrative:     Transition of Care [facility] Screening Note   Patient Details  Name: [name] Date of Birth: June 12, 1932   Transition of Care [facility]) CM/SW Contact:    Zenon Mayo, RN Phone Number: 07/09/2021, 4:37 PM    Transition of Care Department [facility]) has reviewed patient and no TOC needs have been identified at this time. We will continue to monitor patient advancement through interdisciplinary progression rounds. If new patient transition needs arise, please place a TOC consult.          Expected Discharge Plan and Services                                                 Social Determinants of Health (SDOH) Interventions    Readmission Risk Interventions No flowsheet data found.

## 2021-07-09 NOTE — Discharge Instructions (Addendum)
Medications Changes: - START Eliquis 2.5mg  twice dialy. - START Metoprolol succinate (Toprol-XL) 12.5mg  once daily. - START Lasix 40mg  once daily. - START Potassium choloride 86mEq once daily.  Information on my medicine - ELIQUIS (apixaban)  This medication education was reviewed with me or my healthcare representative as part of my discharge preparation.     Why was Eliquis prescribed for you? Eliquis was prescribed for you to reduce the risk of a blood clot forming that can cause a stroke if you have a medical condition called atrial fibrillation (a type of irregular heartbeat).  What do You need to know about Eliquis ? Take your Eliquis TWICE DAILY - one tablet in the morning and one tablet in the evening with or without food. If you have difficulty swallowing the tablet whole please discuss with your pharmacist how to take the medication safely.  Take Eliquis exactly as prescribed by your doctor and DO NOT stop taking Eliquis without talking to the doctor who prescribed the medication.  Stopping may increase your risk of developing a stroke.  Refill your prescription before you run out.  After discharge, you should have regular check-up appointments with your healthcare provider that is prescribing your Eliquis.  In the future your dose may need to be changed if your kidney function or weight changes by a significant amount or as you get older.  What do you do if you miss a dose? If you miss a dose, take it as soon as you remember on the same day and resume taking twice daily.  Do not take more than one dose of ELIQUIS at the same time to make up a missed dose.  Important Safety Information A possible side effect of Eliquis is bleeding. You should call your healthcare provider right away if you experience any of the following: Bleeding from an injury or your nose that does not stop. Unusual colored urine (red or dark brown) or unusual colored stools (red or black). Unusual  bruising for unknown reasons. A serious fall or if you hit your head (even if there is no bleeding).  Some medicines may interact with Eliquis and might increase your risk of bleeding or clotting while on Eliquis. To help avoid this, consult your healthcare provider or pharmacist prior to using any new prescription or non-prescription medications, including herbals, vitamins, non-steroidal anti-inflammatory drugs (NSAIDs) and supplements.  This website has more information on Eliquis (apixaban): http://www.eliquis.com/eliquis/home   ======================================  Atrial Fibrillation    Atrial fibrillation is a type of heartbeat that is irregular or fast. If you have this condition, your heart beats without any order. This makes it hard for your heart to pump blood in a normal way. Atrial fibrillation may come and go, or it may become a long-lasting problem. If this condition is not treated, it can put you at higher risk for stroke, heart failure, and other heart problems.  What are the causes? This condition may be caused by diseases that damage the heart. They include: High blood pressure. Heart failure. Heart valve disease. Heart surgery. Other causes include: Diabetes. Thyroid disease. Being overweight. Kidney disease. Sometimes the cause is not known.  What increases the risk? You are more likely to develop this condition if: You are older. You smoke. You exercise often and very hard. You have a family history of this condition. You are a man. You use drugs. You drink a lot of alcohol. You have lung conditions, such as emphysema, pneumonia, or COPD. You have sleep apnea.  What are the signs or symptoms? Common symptoms of this condition include: A feeling that your heart is beating very fast. Chest pain or discomfort. Feeling short of breath. Suddenly feeling light-headed or weak. Getting tired easily during activity. Fainting. Sweating. In some  cases, there are no symptoms.  How is this treated? Treatment for this condition depends on underlying conditions and how you feel when you have atrial fibrillation. They include: Medicines to: Prevent blood clots. Treat heart rate or heart rhythm problems. Using devices, such as a pacemaker, to correct heart rhythm problems. Doing surgery to remove the part of the heart that sends bad signals. Closing an area where clots can form in the heart (left atrial appendage). In some cases, your doctor will treat other underlying conditions.  Follow these instructions at home:  Medicines Take over-the-counter and prescription medicines only as told by your doctor. Do not take any new medicines without first talking to your doctor. If you are taking blood thinners: Talk with your doctor before you take any medicines that have aspirin or NSAIDs, such as ibuprofen, in them. Take your medicine exactly as told by your doctor. Take it at the same time each day. Avoid activities that could hurt or bruise you. Follow instructions about how to prevent falls. Wear a bracelet that says you are taking blood thinners. Or, carry a card that lists what medicines you take. Lifestyle         Do not use any products that have nicotine or tobacco in them. These include cigarettes, e-cigarettes, and chewing tobacco. If you need help quitting, ask your doctor. Eat heart-healthy foods. Talk with your doctor about the right eating plan for you. Exercise regularly as told by your doctor. Do not drink alcohol. Lose weight if you are overweight. Do not use drugs, including cannabis.  General instructions If you have a condition that causes breathing to stop for a short period of time (apnea), treat it as told by your doctor. Keep a healthy weight. Do not use diet pills unless your doctor says they are safe for you. Diet pills may make heart problems worse. Keep all follow-up visits as told by your doctor. This  is important.  Contact a doctor if: You notice a change in the speed, rhythm, or strength of your heartbeat. You are taking a blood-thinning medicine and you get more bruising. You get tired more easily when you move or exercise. You have a sudden change in weight.  Get help right away if:    You have pain in your chest or your belly (abdomen). You have trouble breathing. You have side effects of blood thinners, such as blood in your vomit, poop (stool), or pee (urine), or bleeding that cannot stop. You have any signs of a stroke. "BE FAST" is an easy way to remember the main warning signs: B - Balance. Signs are dizziness, sudden trouble walking, or loss of balance. E - Eyes. Signs are trouble seeing or a change in how you see. F - Face. Signs are sudden weakness or loss of feeling in the face, or the face or eyelid drooping on one side. A - Arms. Signs are weakness or loss of feeling in an arm. This happens suddenly and usually on one side of the body. S - Speech. Signs are sudden trouble speaking, slurred speech, or trouble understanding what people say. T - Time. Time to call emergency services. Write down what time symptoms started. You have other signs of a stroke,  such as: A sudden, very bad headache with no known cause. Feeling like you may vomit (nausea). Vomiting. A seizure.  These symptoms may be an emergency. Do not wait to see if the symptoms will go away. Get medical help right away. Call your local emergency services (911 in the [place] Do not drive yourself to the hospital. Summary Atrial fibrillation is a type of heartbeat that is irregular or fast. You are at higher risk of this condition if you smoke, are older, have diabetes, or are overweight. Follow your doctor's instructions about medicines, diet, exercise, and follow-up visits. Get help right away if you have signs or symptoms of a stroke. Get help right away if you cannot catch your breath, or you have chest  pain or discomfort. This information is not intended to replace advice given to you by your health care provider. Make sure you discuss any questions you have with your health care provider. Document Revised: 11/12/2018 Document Reviewed: 11/12/2018 Elsevier Patient Education  [place]. ============================ .Atrial Fibrillation    Atrial fibrillation is a type of heartbeat that is irregular or fast. If you have this condition, your heart beats without any order. This makes it hard for your heart to pump blood in a normal way. Atrial fibrillation may come and go, or it may become a long-lasting problem. If this condition is not treated, it can put you at higher risk for stroke, heart failure, and other heart problems.  What are the causes? This condition may be caused by diseases that damage the heart. They include: High blood pressure. Heart failure. Heart valve disease. Heart surgery. Other causes include: Diabetes. Thyroid disease. Being overweight. Kidney disease. Sometimes the cause is not known.  What increases the risk? You are more likely to develop this condition if: You are older. You smoke. You exercise often and very hard. You have a family history of this condition. You are a man. You use drugs. You drink a lot of alcohol. You have lung conditions, such as emphysema, pneumonia, or COPD. You have sleep apnea.  What are the signs or symptoms? Common symptoms of this condition include: A feeling that your heart is beating very fast. Chest pain or discomfort. Feeling short of breath. Suddenly feeling light-headed or weak. Getting tired easily during activity. Fainting. Sweating. In some cases, there are no symptoms.  How is this treated? Treatment for this condition depends on underlying conditions and how you feel when you have atrial fibrillation. They include: Medicines to: Prevent blood clots. Treat heart rate or heart rhythm  problems. Using devices, such as a pacemaker, to correct heart rhythm problems. Doing surgery to remove the part of the heart that sends bad signals. Closing an area where clots can form in the heart (left atrial appendage). In some cases, your doctor will treat other underlying conditions.  Follow these instructions at home:  Medicines Take over-the-counter and prescription medicines only as told by your doctor. Do not take any new medicines without first talking to your doctor. If you are taking blood thinners: Talk with your doctor before you take any medicines that have aspirin or NSAIDs, such as ibuprofen, in them. Take your medicine exactly as told by your doctor. Take it at the same time each day. Avoid activities that could hurt or bruise you. Follow instructions about how to prevent falls. Wear a bracelet that says you are taking blood thinners. Or, carry a card that lists what medicines you take. Lifestyle  Do not use any products that have nicotine or tobacco in them. These include cigarettes, e-cigarettes, and chewing tobacco. If you need help quitting, ask your doctor. Eat heart-healthy foods. Talk with your doctor about the right eating plan for you. Exercise regularly as told by your doctor. Do not drink alcohol. Lose weight if you are overweight. Do not use drugs, including cannabis.  General instructions If you have a condition that causes breathing to stop for a short period of time (apnea), treat it as told by your doctor. Keep a healthy weight. Do not use diet pills unless your doctor says they are safe for you. Diet pills may make heart problems worse. Keep all follow-up visits as told by your doctor. This is important.  Contact a doctor if: You notice a change in the speed, rhythm, or strength of your heartbeat. You are taking a blood-thinning medicine and you get more bruising. You get tired more easily when you move or exercise. You have a sudden  change in weight.  Get help right away if:    You have pain in your chest or your belly (abdomen). You have trouble breathing. You have side effects of blood thinners, such as blood in your vomit, poop (stool), or pee (urine), or bleeding that cannot stop. You have any signs of a stroke. "BE FAST" is an easy way to remember the main warning signs: B - Balance. Signs are dizziness, sudden trouble walking, or loss of balance. E - Eyes. Signs are trouble seeing or a change in how you see. F - Face. Signs are sudden weakness or loss of feeling in the face, or the face or eyelid drooping on one side. A - Arms. Signs are weakness or loss of feeling in an arm. This happens suddenly and usually on one side of the body. S - Speech. Signs are sudden trouble speaking, slurred speech, or trouble understanding what people say. T - Time. Time to call emergency services. Write down what time symptoms started. You have other signs of a stroke, such as: A sudden, very bad headache with no known cause. Feeling like you may vomit (nausea). Vomiting. A seizure.  These symptoms may be an emergency. Do not wait to see if the symptoms will go away. Get medical help right away. Call your local emergency services (911 in the [place] Do not drive yourself to the hospital. Summary Atrial fibrillation is a type of heartbeat that is irregular or fast. You are at higher risk of this condition if you smoke, are older, have diabetes, or are overweight. Follow your doctor's instructions about medicines, diet, exercise, and follow-up visits. Get help right away if you have signs or symptoms of a stroke. Get help right away if you cannot catch your breath, or you have chest pain or discomfort. This information is not intended to replace advice given to you by your health care provider. Make sure you discuss any questions you have with your health care provider. Document Revised: 11/12/2018 Document Reviewed:  11/12/2018 Elsevier Patient Education  [place].

## 2021-07-10 ENCOUNTER — Inpatient Hospital Stay (HOSPITAL_COMMUNITY): Payer: Medicare PPO

## 2021-07-10 ENCOUNTER — Other Ambulatory Visit (HOSPITAL_COMMUNITY): Payer: Self-pay

## 2021-07-10 DIAGNOSIS — I4892 Unspecified atrial flutter: Secondary | ICD-10-CM | POA: Diagnosis not present

## 2021-07-10 DIAGNOSIS — I5020 Unspecified systolic (congestive) heart failure: Secondary | ICD-10-CM

## 2021-07-10 LAB — BASIC METABOLIC PANEL
Anion gap: 9 (ref 5–15)
BUN: 12 mg/dL (ref 8–23)
CO2: 27 mmol/L (ref 22–32)
Calcium: 8.6 mg/dL — ABNORMAL LOW (ref 8.9–10.3)
Chloride: 99 mmol/L (ref 98–111)
Creatinine, Ser: 1.17 mg/dL — ABNORMAL HIGH (ref 0.44–1.00)
GFR, Estimated: 45 mL/min — ABNORMAL LOW (ref >60)
Glucose, Bld: 83 mg/dL (ref 70–99)
Potassium: 4.1 mmol/L (ref 3.5–5.1)
Sodium: 135 mmol/L (ref 135–145)

## 2021-07-10 LAB — PROTIME-INR
INR: 1.4 — ABNORMAL HIGH (ref 0.8–1.2)
Prothrombin Time: 17.2 seconds — ABNORMAL HIGH (ref 11.4–15.2)

## 2021-07-10 MED ORDER — SODIUM CHLORIDE 0.9 % IV SOLN: INTRAVENOUS | Status: DC

## 2021-07-10 MED ORDER — ALUM & MAG HYDROXIDE-SIMETH 200-200-20 MG/5ML PO SUSP
30.0000 mL | ORAL | Status: DC | PRN
  Administered 2021-07-10: 21:00:00 30 mL via ORAL
  Filled 2021-07-10: qty 30

## 2021-07-10 MED ORDER — ALUM & MAG HYDROXIDE-SIMETH 200-200-20 MG/5ML PO SUSP
15.0000 mL | Freq: Once | ORAL | Status: AC
  Administered 2021-07-10: 15 mL via ORAL
  Filled 2021-07-10: qty 30

## 2021-07-10 NOTE — TOC Benefit Eligibility Note (Signed)
Patient Teacher, English as a foreign language completed.    The patient is currently admitted and upon discharge could be taking Eliquis 2.5 mg.  The current 30 day co-pay is, $40.00.   The patient is insured through [place], [place] Patient Advocate Specialist [place] Patient Advocate Team Direct Number: [phone]  Fax: [phone]

## 2021-07-10 NOTE — Evaluation (Signed)
Occupational Therapy Evaluation Patient Details Name: Cassandra Romero MRN: [id number] DOB: March 09, 1933 Today's Date: 07/10/2021   History of Present Illness Patient is a 88 yo female presenting from [facility] on 07/09/21 with c/o SOB and heart palpitations. Upon work up, pt with CHF exacerbation, atrial flutter with RVR, and large left pleural effusion foud, with 400 ml fluid removed via thoracentesis. PMH includes: atrial myxoma (s/p resection in 2010), paroxysmal atrial fibrillation complicated by tachy-brady syndrome (s/p [employer] PPM placement in 2010), HTN, HLD and newly diagnosed atrial flutter with RVR and HFrEF (diagnosed in 06/2021 and EF 30-35% by echo)   Clinical Impression   Prior to this admission, patient living in independent facility with spouse (spouse has mild dementia) and independent in all levels of care, and still driving. Currently, patient requiring 1L of oxygen to maintain appropriate O2 levels, and presents with decreased activity tolerance and endurance. Patient set up for all ADLs, and able to ambulate around room without assistive device or assist. No OT follow up recommended after discharge, but would continue to benefit from acute OT services in order to address problem list outlined below.      Recommendations for follow up therapy are one component of a multi-disciplinary discharge planning process, led by the attending physician.  Recommendations may be updated based on patient status, additional functional criteria and insurance authorization.   Follow Up Recommendations  No OT follow up    Assistance Recommended at Discharge PRN  Patient can return home with the following Assistance with cooking/housework;Assist for transportation;A little help with walking and/or transfers;A little help with bathing/dressing/bathroom    Functional Status Assessment  Patient has had a recent decline in their functional status and demonstrates the ability to make  significant improvements in function in a reasonable and predictable amount of time.  Equipment Recommendations  None recommended by OT (Patient has all DME needed)    Recommendations for Other Services       Precautions / Restrictions Precautions Precautions: Fall Precaution Comments: watch SpO2 Restrictions Weight Bearing Restrictions: No      Mobility Bed Mobility               General bed mobility comments: up in chair upon arrival    Transfers Overall transfer level: Independent Equipment used: None               General transfer comment: pt able to complete without instability or assist.      Balance Overall balance assessment: Mild deficits observed, not formally tested                                         ADL either performed or assessed with clinical judgement   ADL Overall ADL's : Needs assistance/impaired Eating/Feeding: Independent   Grooming: Set up;Wash/dry hands;Wash/dry face;Oral care;Brushing hair;Standing   Upper Body Bathing: Set up   Lower Body Bathing: Set up   Upper Body Dressing : Set up   Lower Body Dressing: Set up   Toilet Transfer: Supervision/safety;Ambulation           Functional mobility during ADLs: Set up General ADL Comments: Patient presenting with decreased activity tolerance and endurance but reports she is close to her baseline     Vision Baseline Vision/History: 1 Wears glasses Ability to See in Adequate Light: 0 Adequate Patient Visual Report: No change from baseline (Having cataracts surgery in April)  Perception     Praxis      Pertinent Vitals/Pain Pain Assessment Pain Assessment: No/denies pain     Hand Dominance Right   Extremity/Trunk Assessment Upper Extremity Assessment Upper Extremity Assessment: Overall WFL for tasks assessed   Lower Extremity Assessment Lower Extremity Assessment: Overall WFL for tasks assessed   Cervical / Trunk Assessment Cervical  / Trunk Assessment: Normal   Communication Communication Communication: HOH (with hearing aides)   Cognition Arousal/Alertness: Awake/alert Behavior During Therapy: WFL for tasks assessed/performed Overall Cognitive Status: Within Functional Limits for tasks assessed                                       General Comments  placed back on 1L due to sats at 88%, after satting at 96%    Exercises     Shoulder Instructions      Home Living Family/patient expects to be discharged to:: Private residence Living Arrangements: Spouse/significant other Available Help at Discharge: Family;Available 24 hours/day Type of Home: House Home Access: Level entry     Home Layout: One level     Bathroom Shower/Tub: Occupational psychologist: Standard     Home Equipment: Shower seat;Grab bars - tub/shower;Grab bars - toilet   Additional Comments: independent living facility      Prior Functioning/Environment Prior Level of Function : Independent/Modified Independent;Driving             Mobility Comments: pt reports no need for DME, reports sporatic activity not consistent exercise reports she is "pretty active" ADLs Comments: independent, states she sits to wash lower body in shower        OT Problem List: Decreased strength;Decreased activity tolerance;Cardiopulmonary status limiting activity;Increased edema      OT Treatment/Interventions: Self-care/ADL training;Therapeutic exercise;Energy conservation;DME and/or AE instruction;Therapeutic activities;Patient/family education;Balance training    OT Goals(Current goals can be found in the care plan section) Acute Rehab OT Goals Patient Stated Goal: To get better and get home OT Goal Formulation: With patient Time For Goal Achievement: 07/24/21 Potential to Achieve Goals: Good  OT Frequency: Min 2X/week    Co-evaluation              AM-PAC OT "6 Clicks" Daily Activity     Outcome Measure Help  from another person eating meals?: None Help from another person taking care of personal grooming?: A Little Help from another person toileting, which includes using toliet, bedpan, or urinal?: A Little Help from another person bathing (including washing, rinsing, drying)?: A Little Help from another person to put on and taking off regular upper body clothing?: A Little Help from another person to put on and taking off regular lower body clothing?: A Little 6 Click Score: 19   End of Session Equipment Utilized During Treatment: Gait belt Nurse Communication: Mobility status  Activity Tolerance: Patient tolerated treatment well;Patient limited by fatigue Patient left: in chair;with call bell/phone within reach  OT Visit Diagnosis: Unsteadiness on feet (R26.81);Other abnormalities of gait and mobility (R26.89);Muscle weakness (generalized) (M62.81)                Time: 8325-4982 OT Time Calculation (min): 23 min Charges:  OT General Charges $OT Visit: 1 Visit OT Evaluation $OT Eval Moderate Complexity: 1 Mod  Corinne Ports E. Corinn Stoltzfus, OTR/L Acute Rehabilitation Services [phone] [place] 07/10/2021, 9:59 AM

## 2021-07-10 NOTE — Progress Notes (Signed)
Progress Note  Patient Name: Cassandra Romero Date of Encounter: 07/10/2021  [facility] Cardiologist: Jenne Campus, MD    Subjective    88 yo with history of paroxysmal atrial fibrillation, sick sinus syndrome, status post Medtronic pacemaker placement in 2010.  She was admitted last night with congestive heart failure and rapid atrial fibrillation.  She transferred from [facility].  She was recently seen by Dr. Agustin Cree in January, 2023.  Echo at that time revealed moderate left ventricular dysfunction with an EF of 30 to 35%.  She also had moderate RV dysfunction.  She was in rapid atrial flutter at the time.  She presented to [facility] on February 2 with worsening palpitations and increasing shortness of breath.  Lungs CTA showed no evidence of pulmonary embolus.  She had a large left pleural effusion which was tapped.  She was transferred primarily to have a transesophageal echo/cardioversion.  She was on heparin at the time of transfer,  has been started on Eliquis 2.5 BID   Has been diuresed 1 liter so far this admission .  She is scheduled for TEE / CV tomorrow at 1 PM    Inpatient Medications    Scheduled Meds:  apixaban  2.5 mg Oral BID   furosemide  40 mg Intravenous Daily   magnesium oxide  400 mg Oral Daily   metoprolol succinate  12.5 mg Oral Daily   sodium chloride flush  3 mL Intravenous Q12H   Continuous Infusions:  sodium chloride     PRN Meds: sodium chloride, acetaminophen, ondansetron (ZOFRAN) IV, sodium chloride flush   Vital Signs    Vitals:   07/09/21 2051 07/10/21 0006 07/10/21 0420 07/10/21 0721  BP: 113/84 105/86 103/71 108/80  Pulse: (!) 124 (!) 123 (!) 117 (!) 118  Resp:  13 20 15   Temp:  97.9 F (36.6 C) 98.2 F (36.8 C) 98.2 F (36.8 C)  TempSrc:  Oral Oral Oral  SpO2:  93% 95% 94%  Weight:  57.2 kg    Height:        Intake/Output Summary (Last 24 hours) at 07/10/2021 0842 Last data filed at 07/10/2021  0810 Gross per 24 hour  Intake 420 ml  Output 1450 ml  Net -1030 ml   Last 3 Weights 07/10/2021 07/09/2021 06/27/2021  Weight (lbs) 126 lb 1.7 oz 128 lb 15.5 oz 133 lb 9.6 oz  Weight (kg) 57.2 kg 58.5 kg 60.601 kg      Telemetry    Rapid atrial fib - Personally Reviewed  ECG     - Personally Reviewed  Physical Exam   GEN: elderly female,  Neck: No JVD Cardiac: irreg. Irreg.  Respiratory: basilar rales  GI: Soft, nontender, non-distended  MS: No edema; No deformity. Neuro:  Nonfocal  Psych: Normal affect   Labs    High Sensitivity Troponin:  No results for input(s): TROPONINIHS in the last 720 hours.   Chemistry Recent Labs  Lab 07/09/21 1751 07/10/21 0208  NA 137 135  K 4.5 4.1  CL 102 99  CO2 24 27  GLUCOSE 81 83  BUN 12 12  CREATININE 1.19* 1.17*  CALCIUM 8.9 8.6*  MG 1.8  --   PROT 6.0*  --   ALBUMIN 3.4*  --   AST 29  --   ALT 17  --   ALKPHOS 44  --   BILITOT 0.9  --   GFRNONAA 45* 45*  ANIONGAP 11 9    Lipids No results  for input(s): CHOL, TRIG, HDL, LABVLDL, LDLCALC, CHOLHDL in the last 168 hours.  Hematology Recent Labs  Lab 07/09/21 1751  WBC 4.5  RBC 4.22  HGB 12.0  HCT 40.3  MCV 95.5  MCH 28.4  MCHC 29.8*  RDW 14.0  PLT 186   Thyroid No results for input(s): TSH, FREET4 in the last 168 hours.  BNP Recent Labs  Lab 07/09/21 1751  BNP 133.5*    DDimer No results for input(s): DDIMER in the last 168 hours.   Radiology    DG Chest Port 1 View  Result Date: 07/10/2021 CLINICAL DATA:  88 year old female with pulmonary edema, shortness of breath and weakness. EXAM: PORTABLE CHEST 1 VIEW COMPARISON:  Chest CTA 07/06/2021. Portable chest 07/27/2021 and earlier. FINDINGS: Portable AP semi upright view at 0552 hours. Regressed pleural effusion, both the layering effusion on the left and fluid in the fissures on the right since 07/06/2021. Mildly improved lung volumes and lung base ventilation. No overt edema. Additional streaky bilateral  pulmonary opacity most resembles atelectasis or scarring. No pneumothorax. Stable cardiac size and mediastinal contours. Stable left chest cardiac pacemaker. Calcified aortic atherosclerosis. Paucity of bowel gas in the upper abdomen. Visualized tracheal air column is within normal limits. No acute osseous abnormality identified. IMPRESSION: 1. Mildly improved lung volumes, mildly regressed pleural effusions, and mildly improved lung base ventilation since 07/06/2021. 2. No overt edema.  No new cardiopulmonary abnormality. Electronically Signed   By: Genevie Ann M.D.   On: 07/10/2021 08:16    Cardiac Studies      Patient Profile     88 y.o. female  with rapid Afib, CHF   Assessment & Plan   Atrial fibrillation with rapid ventricular response: The patient seems to be tolerating it fairly well.  She is scheduled for TEE cardioversion tomorrow.  I discussed the risk, benefits, options regarding TEE and cardioversion.  She understands and agrees to proceed. On eliquis 2.5 bid   2.  Acute on chronic combined systolic and diastolic congestive heart failure: Recent echocardiogram from [place]. 24, 2023 showed AF 30-35%.  RV enlargement with mod reduction of RV function - likely rate related.       For questions or updates, please contact [place] Please consult [email or web address] for contact info under        Signed, Mertie Moores, MD  07/10/2021, 8:42 AM

## 2021-07-10 NOTE — Progress Notes (Addendum)
Pts HR continuing to sustain in 120s after PM dose of metoprolol. Pt is asymptomatic, and vitals WNL. MD notified.

## 2021-07-10 NOTE — Progress Notes (Signed)
Mobility Specialist Progress Note   07/10/21 1230  Mobility  Activity Ambulated independently in room  Level of Assistance Standby assist, set-up cues, supervision of patient - no hands on  Assistive Device None  Distance Ambulated (ft) 56 ft (28x2)  Activity Response Tolerated well  $Mobility charge 1 Mobility   Received pt in chair c/o of minimal nausea but agreeable to mobility. Asymptomatic throughout ambulation w/ x2 STS on each bout. During session there was a slight show of Vtach on monitor, x1 seated break to calm HR, RN notified afterwards. returned back to chair w/ call bell in reach and all needs met.   Pre Mobility:122 HR, 92% SpO2 on RA During Mobility: 129 HR, 90% SpO2 on RA  Post Mobility: 126 HR, 91% SpO2 RA   [place]land Falling Mobility Specialist Phone Number [phone]

## 2021-07-10 NOTE — Evaluation (Addendum)
Physical Therapy Evaluation Patient Details Name: Cassandra Romero MRN: [id number] DOB: 1932/06/08 Today's Date: 07/10/2021  History of Present Illness  Patient is a 88 yo female presenting from [facility] on 07/09/21 with c/o SOB and heart palpitations. Upon work up, pt with CHF exacerbation, atrial flutter with RVR, and large left pleural effusion foud, with 400 ml fluid removed via thoracentesis. PMH includes: atrial myxoma (s/p resection in 2010), paroxysmal atrial fibrillation complicated by tachy-brady syndrome (s/p Medtronic PPM placement in 2010), HTN, HLD and newly diagnosed atrial flutter with RVR and HFrEF (diagnosed in 06/2021 and EF 30-35% by echo)   Clinical Impression  Pt in bed upon arrival of PT, agreeable to evaluation at this time. Prior to admission the pt was completely independent, living in a home with her spouse in an independent living facility. The pt reports no need for DME or O2 at baseline, still driving and completing all IADLs. The pt now presents with limitations in functional mobility, dynamic stability, and endurance due to above dx, and will continue to benefit from skilled PT to address these deficits. The pt was limited to ~150 ft hallway ambulation and was intermittently reaching for single UE support. The pt completed ambulation on RA with SpO2 92-98%, but was as low as 89% after session while seated and at rest on RA, improved with PLB and 1L O2. Will continue to benefit from skilled PT to progress endurance and stability as pt goal is to return to full independence without need for DME. Discussed importance of continued activity and return to exercise to improve functional strength, reduce fall risk, and improve endurance, pt verbalized understanding.   Gait Speed: 0.34m/s on RA. (Gait speed <0.4m/s indicates increased risk of falls and dependence in ADLs)  5X Sit-to-Stand: 18.7 sec (> 14.8 sec indicates increased risk of falls for individuals aged 13-86, > 15  sec indicates increased risk of recurrent falls)      Recommendations for follow up therapy are one component of a multi-disciplinary discharge planning process, led by the attending physician.  Recommendations may be updated based on patient status, additional functional criteria and insurance authorization.  Follow Up Recommendations No PT follow up (pt encouraged to participate in exercise classes offered through her independent living facility)    Assistance Recommended at Discharge Intermittent Supervision/Assistance  Patient can return home with the following       Equipment Recommendations None recommended by PT  Recommendations for Other Services       Functional Status Assessment Patient has had a recent decline in their functional status and demonstrates the ability to make significant improvements in function in a reasonable and predictable amount of time.     Precautions / Restrictions Precautions Precautions: Fall Precaution Comments: watch SpO2 Restrictions Weight Bearing Restrictions: No      Mobility  Bed Mobility Overal bed mobility: Independent             General bed mobility comments: no assist needed    Transfers Overall transfer level: Independent Equipment used: None               General transfer comment: pt able to complete without instability or assist. x5 without UE in 18.7 sec    Ambulation/Gait Ambulation/Gait assistance: Supervision Gait Distance (Feet): 150 Feet Assistive device: None Gait Pattern/deviations: Step-through pattern, Decreased stride length Gait velocity: 0.45 m/s Gait velocity interpretation: 1.31 - 2.62 ft/sec, indicative of limited community ambulator   General Gait Details: pt intermittently reaching for UE  support on rail, slow but no overt LOB      Balance Overall balance assessment: Mild deficits observed, not formally tested                                           Pertinent  Vitals/Pain Pain Assessment Pain Assessment: No/denies pain    Home Living Family/patient expects to be discharged to:: Private residence Living Arrangements: Spouse/significant other Available Help at Discharge: Family;Available 24 hours/day Type of Home: House Home Access: Level entry       Home Layout: One level Home Equipment: Shower seat;Grab bars - tub/shower;Grab bars - toilet Additional Comments: independent living facility    Prior Function Prior Level of Function : Independent/Modified Independent;Driving             Mobility Comments: pt reports no need for DME, reports sporatic activity not consistent exercise reports she is "pretty active" ADLs Comments: independent, states she sits to wash lower body in shower     Hand Dominance   Dominant Hand: Right    Extremity/Trunk Assessment   Upper Extremity Assessment Upper Extremity Assessment: Overall WFL for tasks assessed    Lower Extremity Assessment Lower Extremity Assessment: Overall WFL for tasks assessed    Cervical / Trunk Assessment Cervical / Trunk Assessment: Normal  Communication   Communication: HOH (with hearing aides)  Cognition Arousal/Alertness: Awake/alert Behavior During Therapy: WFL for tasks assessed/performed Overall Cognitive Status: Within Functional Limits for tasks assessed                                          General Comments General comments (skin integrity, edema, etc.): pt on 2L upon arrival, maintained SpO2 92-98% with ambulation. HR max of 136 bpm    Exercises Other Exercises Other Exercises: 5x sit-stand in 18.71 sec   Assessment/Plan    PT Assessment Patient needs continued PT services  PT Problem List Decreased activity tolerance;Decreased balance;Cardiopulmonary status limiting activity       PT Treatment Interventions Stair training;DME instruction;Gait training;Functional mobility training;Therapeutic activities;Therapeutic  exercise;Balance training;Patient/family education    PT Goals (Current goals can be found in the Care Plan section)  Acute Rehab PT Goals Patient Stated Goal: return home PT Goal Formulation: With patient Time For Goal Achievement: 07/24/21 Potential to Achieve Goals: Good    Frequency Min 3X/week        AM-PAC PT "6 Clicks" Mobility  Outcome Measure Help needed turning from your back to your side while in a flat bed without using bedrails?: None Help needed moving from lying on your back to sitting on the side of a flat bed without using bedrails?: None Help needed moving to and from a bed to a chair (including a wheelchair)?: A Little Help needed standing up from a chair using your arms (e.g., wheelchair or bedside chair)?: A Little Help needed to walk in hospital room?: A Little Help needed climbing 3-5 steps with a railing? : A Little 6 Click Score: 20    End of Session Equipment Utilized During Treatment: Gait belt Activity Tolerance: Patient tolerated treatment well Patient left: in chair;with call bell/phone within reach Nurse Communication: Mobility status PT Visit Diagnosis: Other abnormalities of gait and mobility (R26.89);Muscle weakness (generalized) (M62.81)    Time: 3016-0109 PT Time Calculation (min) (  ACUTE ONLY): 27 min   Charges:   PT Evaluation $PT Eval Low Complexity: 1 Low PT Treatments $Therapeutic Exercise: 8-22 mins        West Carbo, PT, DPT   Acute Rehabilitation Department Pager #: [phone]  Sandra Cockayne 07/10/2021, 8:46 AM

## 2021-07-11 ENCOUNTER — Encounter (HOSPITAL_COMMUNITY)
Admission: AD | Disposition: A | Discharge status: Home / Self Care | Payer: Self-pay | Source: Other Acute Inpatient Hospital | Attending: Internal Medicine

## 2021-07-11 ENCOUNTER — Inpatient Hospital Stay (HOSPITAL_COMMUNITY): Payer: Medicare PPO

## 2021-07-11 ENCOUNTER — Inpatient Hospital Stay (HOSPITAL_COMMUNITY): Payer: Medicare PPO | Admitting: Anesthesiology

## 2021-07-11 ENCOUNTER — Encounter (HOSPITAL_COMMUNITY): Payer: Self-pay | Admitting: Cardiology

## 2021-07-11 DIAGNOSIS — I340 Nonrheumatic mitral (valve) insufficiency: Secondary | ICD-10-CM | POA: Diagnosis not present

## 2021-07-11 DIAGNOSIS — I5020 Unspecified systolic (congestive) heart failure: Secondary | ICD-10-CM | POA: Diagnosis not present

## 2021-07-11 DIAGNOSIS — I361 Nonrheumatic tricuspid (valve) insufficiency: Secondary | ICD-10-CM

## 2021-07-11 DIAGNOSIS — I4892 Unspecified atrial flutter: Secondary | ICD-10-CM

## 2021-07-11 DIAGNOSIS — I4891 Unspecified atrial fibrillation: Secondary | ICD-10-CM

## 2021-07-11 HISTORY — PX: CARDIOVERSION: SHX1299

## 2021-07-11 HISTORY — PX: TEE WITHOUT CARDIOVERSION: SHX5443

## 2021-07-11 LAB — ECHO TEE
MV M vel: 4.85 m/s
MV Peak grad: 94.1 mmHg
Radius: 0.4 cm

## 2021-07-11 LAB — BASIC METABOLIC PANEL
Anion gap: 10 (ref 5–15)
BUN: 16 mg/dL (ref 8–23)
CO2: 29 mmol/L (ref 22–32)
Calcium: 9 mg/dL (ref 8.9–10.3)
Chloride: 97 mmol/L — ABNORMAL LOW (ref 98–111)
Creatinine, Ser: 1.17 mg/dL — ABNORMAL HIGH (ref 0.44–1.00)
GFR, Estimated: 45 mL/min — ABNORMAL LOW (ref >60)
Glucose, Bld: 70 mg/dL (ref 70–99)
Potassium: 4.1 mmol/L (ref 3.5–5.1)
Sodium: 136 mmol/L (ref 135–145)

## 2021-07-11 SURGERY — ECHOCARDIOGRAM, TRANSESOPHAGEAL
Anesthesia: Monitor Anesthesia Care

## 2021-07-11 MED ORDER — PHENYLEPHRINE 40 MCG/ML (10ML) SYRINGE FOR IV PUSH (FOR BLOOD PRESSURE SUPPORT)
PREFILLED_SYRINGE | INTRAVENOUS | Status: DC | PRN
  Administered 2021-07-11: 120 ug via INTRAVENOUS
  Administered 2021-07-11: 80 ug via INTRAVENOUS

## 2021-07-11 MED ORDER — LIDOCAINE 2% (20 MG/ML) 5 ML SYRINGE
INTRAMUSCULAR | Status: DC | PRN
  Administered 2021-07-11: 60 mg via INTRAVENOUS

## 2021-07-11 MED ORDER — PROPOFOL 10 MG/ML IV BOLUS
INTRAVENOUS | Status: DC | PRN
Start: 2021-07-11 — End: 2021-07-11
  Administered 2021-07-11 (×2): 10 mg via INTRAVENOUS

## 2021-07-11 MED ORDER — PROPOFOL 500 MG/50ML IV EMUL
INTRAVENOUS | Status: DC | PRN
  Administered 2021-07-11: 75 ug/kg/min via INTRAVENOUS

## 2021-07-11 NOTE — Progress Notes (Signed)
Progress Note  Patient Name: Cassandra Romero Date of Encounter: 07/11/2021  [facility]Care Cardiologist: Jenne Campus, MD    Subjective    88 yo with history of paroxysmal atrial fibrillation, sick sinus syndrome, status post Medtronic pacemaker placement in 2010.  She was admitted last night with congestive heart failure and rapid atrial fibrillation.  She transferred from [facility].  She was recently seen by Dr. Agustin Cree in January, 2023.  Echo at that time revealed moderate left ventricular dysfunction with an EF of 30 to 35%.  She also had moderate RV dysfunction.  She was in rapid atrial flutter at the time.  She presented to [facility] on February 2 with worsening palpitations and increasing shortness of breath.  Lungs CTA showed no evidence of pulmonary embolus.  She had a large left pleural effusion which was tapped.  She was transferred primarily to have a transesophageal echo/cardioversion.  She was on heparin at the time of transfer,  has been started on Eliquis 2.5 BID   She is diuresed 1.6 L so far during this hospitalization.  She is scheduled for TEE cardioversion later today.   Inpatient Medications    Scheduled Meds:  apixaban  2.5 mg Oral BID   magnesium oxide  400 mg Oral Daily   metoprolol succinate  12.5 mg Oral Daily   sodium chloride flush  3 mL Intravenous Q12H   Continuous Infusions:  sodium chloride     sodium chloride     sodium chloride     PRN Meds: sodium chloride, acetaminophen, alum & mag hydroxide-simeth, ondansetron (ZOFRAN) IV, sodium chloride flush   Vital Signs    Vitals:   07/10/21 1944 07/11/21 0600 07/11/21 0612 07/11/21 0839  BP: 95/81  119/89 110/81  Pulse: (!) 121  (!) 118 (!) 118  Resp: 17  17 17   Temp: (!) 97.4 F (36.3 C)  97.9 F (36.6 C) 97.8 F (36.6 C)  TempSrc: Oral  Oral Oral  SpO2: 96%  95% 96%  Weight:  56.3 kg    Height:        Intake/Output Summary (Last 24 hours) at 07/11/2021 0853 Last  data filed at 07/11/2021 0100 Gross per 24 hour  Intake 423 ml  Output 1000 ml  Net -577 ml    Last 3 Weights 07/11/2021 07/10/2021 07/09/2021  Weight (lbs) 124 lb 3.2 oz 126 lb 1.7 oz 128 lb 15.5 oz  Weight (kg) 56.337 kg 57.2 kg 58.5 kg      Telemetry    Rapid atrial fib - Personally Reviewed  ECG     - Personally Reviewed  Physical Exam   Physical Exam: Blood pressure 110/81, pulse (!) 118, temperature 97.8 F (36.6 C), temperature source Oral, resp. rate 17, height 5\' 5"  (1.651 m), weight 56.3 kg, SpO2 96 %.  GEN:  elderly female  HEENT: Normal NECK: No JVD; No carotid bruits LYMPHATICS: No lymphadenopathy CARDIAC: irreg. Irreg.   RESPIRATORY:  few basilar rales.  ABDOMEN: Soft, non-tender, non-distended MUSCULOSKELETAL:  No edema; No deformity  SKIN: Warm and dry NEUROLOGIC:  Alert and oriented x 3  Labs    High Sensitivity Troponin:  No results for input(s): TROPONINIHS in the last 720 hours.   Chemistry Recent Labs  Lab 07/09/21 1751 07/10/21 0208 07/11/21 0330  NA 137 135 136  K 4.5 4.1 4.1  CL 102 99 97*  CO2 24 27 29   GLUCOSE 81 83 70  BUN 12 12 16   CREATININE 1.19* 1.17*  1.17*  CALCIUM 8.9 8.6* 9.0  MG 1.8  --   --   PROT 6.0*  --   --   ALBUMIN 3.4*  --   --   AST 29  --   --   ALT 17  --   --   ALKPHOS 44  --   --   BILITOT 0.9  --   --   GFRNONAA 45* 45* 45*  ANIONGAP 11 9 10      Lipids No results for input(s): CHOL, TRIG, HDL, LABVLDL, LDLCALC, CHOLHDL in the last 168 hours.  Hematology Recent Labs  Lab 07/09/21 1751  WBC 4.5  RBC 4.22  HGB 12.0  HCT 40.3  MCV 95.5  MCH 28.4  MCHC 29.8*  RDW 14.0  PLT 186    Thyroid No results for input(s): TSH, FREET4 in the last 168 hours.  BNP Recent Labs  Lab 07/09/21 1751  BNP 133.5*     DDimer No results for input(s): DDIMER in the last 168 hours.   Radiology    DG Chest Port 1 View  Result Date: 07/10/2021 CLINICAL DATA:  88 year old female with pulmonary edema, shortness of  breath and weakness. EXAM: PORTABLE CHEST 1 VIEW COMPARISON:  Chest CTA 07/06/2021. Portable chest 07/27/2021 and earlier. FINDINGS: Portable AP semi upright view at 0552 hours. Regressed pleural effusion, both the layering effusion on the left and fluid in the fissures on the right since 07/06/2021. Mildly improved lung volumes and lung base ventilation. No overt edema. Additional streaky bilateral pulmonary opacity most resembles atelectasis or scarring. No pneumothorax. Stable cardiac size and mediastinal contours. Stable left chest cardiac pacemaker. Calcified aortic atherosclerosis. Paucity of bowel gas in the upper abdomen. Visualized tracheal air column is within normal limits. No acute osseous abnormality identified. IMPRESSION: 1. Mildly improved lung volumes, mildly regressed pleural effusions, and mildly improved lung base ventilation since 07/06/2021. 2. No overt edema.  No new cardiopulmonary abnormality. Electronically Signed   By: Genevie Ann M.D.   On: 07/10/2021 08:16    Cardiac Studies      Patient Profile     88 y.o. female  with rapid Afib, CHF   Assessment & Plan   Atrial fibrillation with rapid ventricular response:  HR is around 120 The patient seems to be tolerating it fairly well.  She is scheduled for TEE cardioversion today at 1 pm .   I discussed the risk, benefits, options regarding TEE and cardioversion.  She understands and agrees to proceed. On eliquis 2.5 bid   2.  Acute on chronic combined systolic and diastolic congestive heart failure: Recent echocardiogram from [place]. 24, 2023 showed AF 30-35%.  RV enlargement with mod reduction of RV function - likely rate related.       For questions or updates, please contact [place] Please consult [email or web address] for contact info under        Signed, Mertie Moores, MD  07/11/2021, 8:53 AM

## 2021-07-11 NOTE — CV Procedure (Addendum)
Brief TEE Note  LVEF 25-30%.  Global hypokinesis No LA/LAA thrombus or masses Mild mitral regurgitation  For additional details see full report.  Electrical Cardioversion Procedure Note KIHANNA Romero [id number] 1932/08/31  Procedure: Electrical Cardioversion Indications:  Atrial Fibrillation  Procedure Details Consent: Risks of procedure as well as the alternatives and risks of each were explained to the (patient/caregiver).  Consent for procedure obtained. Time Out: Verified patient identification, verified procedure, site/side was marked, verified correct patient position, special equipment/implants available, medications/allergies/relevent history reviewed, required imaging and test results available.  Performed  Patient placed on cardiac monitor, pulse oximetry, supplemental oxygen as necessary.  Sedation given:  propofol Pacer pads placed anterior and posterior chest.  Cardioverted 1 time(s).  Cardioverted at 150J.  Evaluation Findings: Post procedure EKG shows:  APVS Complications: None Patient did tolerate procedure well.   Skeet Latch, MD 07/11/2021, 1:27 PM

## 2021-07-11 NOTE — Anesthesia Preprocedure Evaluation (Addendum)
Anesthesia Evaluation  Patient identified by MRN, date of birth, ID band Patient awake    Reviewed: Allergy & Precautions, NPO status , Patient's Chart, lab work & pertinent test results  Airway Mallampati: II  TM Distance: >3 FB Neck ROM: Full    Dental  (+) Teeth Intact, Dental Advisory Given   Pulmonary neg pulmonary ROS, former smoker,    Pulmonary exam normal        Cardiovascular hypertension, +CHF  + dysrhythmias Atrial Fibrillation + Cardiac Defibrillator (tachy-brady syndrome s/p AICD 2010)  Rhythm:Irregular Rate:Tachycardia  IMPRESSIONS Resting tachycardia at 125 BPM, possible atrial flutter. Left ventricular ejection fraction, by estimation, is 30 to 35%. The left ventricle has moderate to severely decreased function. Left ventricular endocardial border not optimally defined to evaluate regional wall motion. Left ventricular diastolic parameters are indeterminate. 1. RV > LV size. Right ventricular systolic function is moderately reduced. The right ventricular size is mildly enlarged. 2. The mitral valve is normal in structure. Mild mitral valve regurgitation. No evidence of mitral stenosis. 3. TR is not well defined, color doppler splay is noted TR may be underestimated. Tricuspid valve regurgitation is mild to moderate. 4. Tachycardia at 125 BPM. The aortic valve is tricuspid. Aortic valve regurgitation is not visualized. Aortic valve sclerosis is present, with no evidence of aortic valve stenosis. 5. The inferior vena cava is dilated in size with <50% respiratory variability, suggesting right atrial pressure of 15 mmHg.   Neuro/Psych negative neurological ROS  negative psych ROS   GI/Hepatic Neg liver ROS, GERD  Medicated,  Endo/Other  negative endocrine ROS  Renal/GU Renal InsufficiencyRenal diseaseRight renal mass  negative genitourinary   Musculoskeletal negative musculoskeletal ROS (+)    Abdominal   Peds  Hematology negative hematology ROS (+)   Anesthesia Other Findings   Reproductive/Obstetrics                            Anesthesia Physical  Anesthesia Plan  ASA: 3  Anesthesia Plan: MAC   Post-op Pain Management: Minimal or no pain anticipated   Induction:   PONV Risk Score and Plan: 3 and Treatment may vary due to age or medical condition, TIVA and Propofol infusion  Airway Management Planned: Natural Airway, Simple Face Mask and Nasal Cannula  Additional Equipment: None  Intra-op Plan:   Post-operative Plan:   Informed Consent: I have reviewed the patients History and Physical, chart, labs and discussed the procedure including the risks, benefits and alternatives for the proposed anesthesia with the patient or authorized representative who has indicated his/her understanding and acceptance.     Dental advisory given  Plan Discussed with: Anesthesiologist and CRNA  Anesthesia Plan Comments:        Anesthesia Quick Evaluation

## 2021-07-11 NOTE — Progress Notes (Signed)
Mobility Specialist Progress Note: ° ° 07/11/21 1130  °Mobility  °Activity Ambulated independently in hallway  °Level of Assistance Standby assist, set-up cues, supervision of patient - no hands on  °Assistive Device None  °Distance Ambulated (ft) 400 ft  °Activity Response Tolerated well  °$Mobility charge 1 Mobility  ° °Pt eager for OOB mobility, not requiring any physical assist throughout session. Pt HR elevated to 144 bpm during ambulation. Pt back in bed with all needs met.  ° °Anna Kincaid °Mobility Specialist  °Phone [phone] ° °

## 2021-07-11 NOTE — Progress Notes (Addendum)
Heart Failure Nurse Navigator Progress Note  Assessed for HV TOC readiness. Pt declined HV TOC clinic appt upon DC from hospitalization. Pt educated on benefits of HV TOC. Pt education complete regarding HF patient booklet.   Plans to follow up with Dr. Agustin Cree, lives in [place].    Pricilla Holm, MSN, RN Heart Failure Nurse Navigator [phone]

## 2021-07-11 NOTE — Anesthesia Postprocedure Evaluation (Signed)
Anesthesia Post Note  Patient: Cassandra Romero  Procedure(s) Performed: TRANSESOPHAGEAL ECHOCARDIOGRAM (TEE) CARDIOVERSION     Patient location during evaluation: Endoscopy Anesthesia Type: MAC Level of consciousness: awake and alert Pain management: pain level controlled Vital Signs Assessment: post-procedure vital signs reviewed and stable Respiratory status: spontaneous breathing and respiratory function stable Cardiovascular status: stable Postop Assessment: no apparent nausea or vomiting Anesthetic complications: no   No notable events documented.  Last Vitals:  Vitals:   07/11/21 1400 07/11/21 1410  BP: 90/60 103/61  Pulse: 78 72  Resp: (!) 27 13  Temp:    SpO2: 95% 96%    Last Pain:  Vitals:   07/11/21 1410  TempSrc:   PainSc: 0-No pain                 Terion Hedman DANIEL

## 2021-07-11 NOTE — Interval H&P Note (Signed)
History and Physical Interval Note:  07/11/2021 12:58 PM  Cassandra Romero  has presented today for surgery, with the diagnosis of AFIB.  The various methods of treatment have been discussed with the patient and family. After consideration of risks, benefits and other options for treatment, the patient has consented to  Procedure(s): TRANSESOPHAGEAL ECHOCARDIOGRAM (TEE) (N/A) CARDIOVERSION (N/A) as a surgical intervention.  The patient's history has been reviewed, patient examined, no change in status, stable for surgery.  I have reviewed the patient's chart and labs.  Questions were answered to the patient's satisfaction.     Skeet Latch, MD

## 2021-07-11 NOTE — Transfer of Care (Signed)
Immediate Anesthesia Transfer of Care Note  Patient: Cassandra Romero  Procedure(s) Performed: TRANSESOPHAGEAL ECHOCARDIOGRAM (TEE) CARDIOVERSION  Patient Location: Endoscopy Unit  Anesthesia Type:General  Level of Consciousness: drowsy and patient cooperative  Airway & Oxygen Therapy: Patient Spontanous Breathing and Patient connected to face mask oxygen  Post-op Assessment: Report given to RN and Post -op Vital signs reviewed and stable  Post vital signs: Reviewed and stable  Last Vitals:  Vitals Value Taken Time  BP 92/67 07/11/21 1332  Temp    Pulse 73 07/11/21 1334  Resp 15 07/11/21 1334  SpO2 99 % 07/11/21 1334  Vitals shown include unvalidated device data.  Last Pain:  Vitals:   07/11/21 1218  TempSrc:   PainSc: 0-No pain         Complications: No notable events documented.

## 2021-07-11 NOTE — H&P (View-Only) (Signed)
Progress Note  Patient Name: Cassandra Romero Date of Encounter: 07/11/2021  [facility] Cardiologist: Jenne Campus, MD    Subjective    88 yo with history of paroxysmal atrial fibrillation, sick sinus syndrome, status post Medtronic pacemaker placement in 2010.  She was admitted last night with congestive heart failure and rapid atrial fibrillation.  She transferred from [facility].  She was recently seen by Dr. Agustin Cree in January, 2023.  Echo at that time revealed moderate left ventricular dysfunction with an EF of 30 to 35%.  She also had moderate RV dysfunction.  She was in rapid atrial flutter at the time.  She presented to [facility] on February 2 with worsening palpitations and increasing shortness of breath.  Lungs CTA showed no evidence of pulmonary embolus.  She had a large left pleural effusion which was tapped.  She was transferred primarily to have a transesophageal echo/cardioversion.  She was on heparin at the time of transfer,  has been started on Eliquis 2.5 BID   She is diuresed 1.6 L so far during this hospitalization.  She is scheduled for TEE cardioversion later today.   Inpatient Medications    Scheduled Meds:  apixaban  2.5 mg Oral BID   magnesium oxide  400 mg Oral Daily   metoprolol succinate  12.5 mg Oral Daily   sodium chloride flush  3 mL Intravenous Q12H   Continuous Infusions:  sodium chloride     sodium chloride     sodium chloride     PRN Meds: sodium chloride, acetaminophen, alum & mag hydroxide-simeth, ondansetron (ZOFRAN) IV, sodium chloride flush   Vital Signs    Vitals:   07/10/21 1944 07/11/21 0600 07/11/21 0612 07/11/21 0839  BP: 95/81  119/89 110/81  Pulse: (!) 121  (!) 118 (!) 118  Resp: 17  17 17   Temp: (!) 97.4 F (36.3 C)  97.9 F (36.6 C) 97.8 F (36.6 C)  TempSrc: Oral  Oral Oral  SpO2: 96%  95% 96%  Weight:  56.3 kg    Height:        Intake/Output Summary (Last 24 hours) at 07/11/2021 0853 Last  data filed at 07/11/2021 0100 Gross per 24 hour  Intake 423 ml  Output 1000 ml  Net -577 ml    Last 3 Weights 07/11/2021 07/10/2021 07/09/2021  Weight (lbs) 124 lb 3.2 oz 126 lb 1.7 oz 128 lb 15.5 oz  Weight (kg) 56.337 kg 57.2 kg 58.5 kg      Telemetry    Rapid atrial fib - Personally Reviewed  ECG     - Personally Reviewed  Physical Exam   Physical Exam: Blood pressure 110/81, pulse (!) 118, temperature 97.8 F (36.6 C), temperature source Oral, resp. rate 17, height 5\' 5"  (1.651 m), weight 56.3 kg, SpO2 96 %.  GEN:  elderly female  HEENT: Normal NECK: No JVD; No carotid bruits LYMPHATICS: No lymphadenopathy CARDIAC: irreg. Irreg.   RESPIRATORY:  few basilar rales.  ABDOMEN: Soft, non-tender, non-distended MUSCULOSKELETAL:  No edema; No deformity  SKIN: Warm and dry NEUROLOGIC:  Alert and oriented x 3  Labs    High Sensitivity Troponin:  No results for input(s): TROPONINIHS in the last 720 hours.   Chemistry Recent Labs  Lab 07/09/21 1751 07/10/21 0208 07/11/21 0330  NA 137 135 136  K 4.5 4.1 4.1  CL 102 99 97*  CO2 24 27 29   GLUCOSE 81 83 70  BUN 12 12 16   CREATININE 1.19* 1.17*  1.17*  CALCIUM 8.9 8.6* 9.0  MG 1.8  --   --   PROT 6.0*  --   --   ALBUMIN 3.4*  --   --   AST 29  --   --   ALT 17  --   --   ALKPHOS 44  --   --   BILITOT 0.9  --   --   GFRNONAA 45* 45* 45*  ANIONGAP 11 9 10      Lipids No results for input(s): CHOL, TRIG, HDL, LABVLDL, LDLCALC, CHOLHDL in the last 168 hours.  Hematology Recent Labs  Lab 07/09/21 1751  WBC 4.5  RBC 4.22  HGB 12.0  HCT 40.3  MCV 95.5  MCH 28.4  MCHC 29.8*  RDW 14.0  PLT 186    Thyroid No results for input(s): TSH, FREET4 in the last 168 hours.  BNP Recent Labs  Lab 07/09/21 1751  BNP 133.5*     DDimer No results for input(s): DDIMER in the last 168 hours.   Radiology    DG Chest Port 1 View  Result Date: 07/10/2021 CLINICAL DATA:  88 year old female with pulmonary edema, shortness of  breath and weakness. EXAM: PORTABLE CHEST 1 VIEW COMPARISON:  Chest CTA 07/06/2021. Portable chest 07/27/2021 and earlier. FINDINGS: Portable AP semi upright view at 0552 hours. Regressed pleural effusion, both the layering effusion on the left and fluid in the fissures on the right since 07/06/2021. Mildly improved lung volumes and lung base ventilation. No overt edema. Additional streaky bilateral pulmonary opacity most resembles atelectasis or scarring. No pneumothorax. Stable cardiac size and mediastinal contours. Stable left chest cardiac pacemaker. Calcified aortic atherosclerosis. Paucity of bowel gas in the upper abdomen. Visualized tracheal air column is within normal limits. No acute osseous abnormality identified. IMPRESSION: 1. Mildly improved lung volumes, mildly regressed pleural effusions, and mildly improved lung base ventilation since 07/06/2021. 2. No overt edema.  No new cardiopulmonary abnormality. Electronically Signed   By: Genevie Ann M.D.   On: 07/10/2021 08:16    Cardiac Studies      Patient Profile     88 y.o. female  with rapid Afib, CHF   Assessment & Plan   Atrial fibrillation with rapid ventricular response:  HR is around 120 The patient seems to be tolerating it fairly well.  She is scheduled for TEE cardioversion today at 1 pm .   I discussed the risk, benefits, options regarding TEE and cardioversion.  She understands and agrees to proceed. On eliquis 2.5 bid   2.  Acute on chronic combined systolic and diastolic congestive heart failure: Recent echocardiogram from [place]. 24, 2023 showed AF 30-35%.  RV enlargement with mod reduction of RV function - likely rate related.       For questions or updates, please contact [place] Please consult [email or web address] for contact info under        Signed, Mertie Moores, MD  07/11/2021, 8:53 AM

## 2021-07-12 ENCOUNTER — Other Ambulatory Visit (HOSPITAL_COMMUNITY): Payer: Self-pay

## 2021-07-12 DIAGNOSIS — I4892 Unspecified atrial flutter: Secondary | ICD-10-CM | POA: Diagnosis not present

## 2021-07-12 DIAGNOSIS — I959 Hypotension, unspecified: Secondary | ICD-10-CM

## 2021-07-12 DIAGNOSIS — J90 Pleural effusion, not elsewhere classified: Secondary | ICD-10-CM

## 2021-07-12 DIAGNOSIS — I5020 Unspecified systolic (congestive) heart failure: Secondary | ICD-10-CM | POA: Diagnosis not present

## 2021-07-12 HISTORY — DX: Hypotension, unspecified: I95.9

## 2021-07-12 HISTORY — DX: Pleural effusion, not elsewhere classified: J90

## 2021-07-12 LAB — BASIC METABOLIC PANEL
Anion gap: 13 (ref 5–15)
BUN: 16 mg/dL (ref 8–23)
CO2: 27 mmol/L (ref 22–32)
Calcium: 8.8 mg/dL — ABNORMAL LOW (ref 8.9–10.3)
Chloride: 96 mmol/L — ABNORMAL LOW (ref 98–111)
Creatinine, Ser: 1.24 mg/dL — ABNORMAL HIGH (ref 0.44–1.00)
GFR, Estimated: 42 mL/min — ABNORMAL LOW (ref >60)
Glucose, Bld: 74 mg/dL (ref 70–99)
Potassium: 4.3 mmol/L (ref 3.5–5.1)
Sodium: 136 mmol/L (ref 135–145)

## 2021-07-12 MED ORDER — APIXABAN 2.5 MG PO TABS
2.5000 mg | ORAL_TABLET | Freq: Two times a day (BID) | ORAL | 5 refills | Status: DC
Start: 2021-07-12 — End: 2021-12-25
  Filled 2021-07-12: qty 60, 30d supply, fill #0

## 2021-07-12 MED ORDER — METOPROLOL SUCCINATE ER 25 MG PO TB24
12.5000 mg | ORAL_TABLET | Freq: Every day | ORAL | 2 refills | Status: DC
Start: 2021-07-13 — End: 2021-07-18
  Filled 2021-07-12: qty 15, 30d supply, fill #0

## 2021-07-12 MED ORDER — LOSARTAN POTASSIUM 25 MG PO TABS: 12.5000 mg | ORAL_TABLET | Freq: Every day | ORAL | 2 refills | Status: DC

## 2021-07-12 MED ORDER — POTASSIUM CHLORIDE CRYS ER 10 MEQ PO TBCR
10.0000 meq | EXTENDED_RELEASE_TABLET | Freq: Two times a day (BID) | ORAL | Status: DC
  Administered 2021-07-12: 10 meq via ORAL
  Filled 2021-07-12: qty 1

## 2021-07-12 MED ORDER — METOPROLOL SUCCINATE ER 25 MG PO TB24: 12.5000 mg | ORAL_TABLET | Freq: Every day | ORAL | 2 refills | Status: DC

## 2021-07-12 MED ORDER — LOSARTAN POTASSIUM 25 MG PO TABS
12.5000 mg | ORAL_TABLET | Freq: Every day | ORAL | Status: DC
  Administered 2021-07-12: 12.5 mg via ORAL
  Filled 2021-07-12: qty 1

## 2021-07-12 MED ORDER — APIXABAN 2.5 MG PO TABS: 2.5000 mg | ORAL_TABLET | Freq: Two times a day (BID) | ORAL | 5 refills | Status: DC

## 2021-07-12 MED ORDER — FUROSEMIDE 40 MG PO TABS
40.0000 mg | ORAL_TABLET | Freq: Every day | ORAL | Status: DC
  Administered 2021-07-12: 40 mg via ORAL
  Filled 2021-07-12: qty 1

## 2021-07-12 NOTE — Progress Notes (Signed)
Remote pacemaker transmission.   

## 2021-07-12 NOTE — Telephone Encounter (Signed)
Left another message to return my call.  

## 2021-07-12 NOTE — Progress Notes (Signed)
Mobility Specialist Progress Note:   07/12/21 1050  Mobility  Activity Ambulated independently in hallway  Level of Assistance Standby assist, set-up cues, supervision of patient - no hands on  Assistive Device None  Distance Ambulated (ft) 400 ft  Activity Response Tolerated well  $Mobility charge 1 Mobility   Pt asx during ambulation. Back in bed with all needs met, eager for d/c.  Nelta Numbers Mobility Specialist  Phone [phone]

## 2021-07-12 NOTE — Progress Notes (Signed)
Progress Note  Patient Name: Cassandra Romero Date of Encounter: 07/12/2021  [facility] Cardiologist: Jenne Campus, MD    Subjective    88 yo with history of paroxysmal atrial fibrillation, sick sinus syndrome, status post Medtronic pacemaker placement in 2010.  She was admitted last night with congestive heart failure and rapid atrial fibrillation.  She transferred from [facility].  She was recently seen by Dr. Agustin Cree in January, 2023.  Echo at that time revealed moderate left ventricular dysfunction with an EF of 30 to 35%.  She also had moderate RV dysfunction.  She was in rapid atrial flutter at the time.  She presented to [facility] on February 2 with worsening palpitations and increasing shortness of breath.  Lungs CTA showed no evidence of pulmonary embolus.  She had a large left pleural effusion which was tapped.  She was transferred primarily to have a transesophageal echo/cardioversion.  She was on heparin at the time of transfer,  has been started on Eliquis 2.5 BID   Had a successful cardioversion yesterday  Has diuresed 1.3 liters    Inpatient Medications    Scheduled Meds:  apixaban  2.5 mg Oral BID   metoprolol succinate  12.5 mg Oral Daily   sodium chloride flush  3 mL Intravenous Q12H   Continuous Infusions:  sodium chloride     PRN Meds: sodium chloride, acetaminophen, alum & mag hydroxide-simeth, ondansetron (ZOFRAN) IV, sodium chloride flush   Vital Signs    Vitals:   07/11/21 1932 07/12/21 0509 07/12/21 0600 07/12/21 0916  BP: 114/74 106/65  106/75  Pulse: 76 68  71  Resp: 20 18  18   Temp: 97.9 F (36.6 C) 97.6 F (36.4 C)    TempSrc: Oral Oral    SpO2: 97% 93%  91%  Weight:   56.4 kg   Height:        Intake/Output Summary (Last 24 hours) at 07/12/2021 1038 Last data filed at 07/12/2021 0917 Gross per 24 hour  Intake 720.54 ml  Output 300 ml  Net 420.54 ml    Last 3 Weights 07/12/2021 07/11/2021 07/10/2021  Weight (lbs)  124 lb 5.4 oz 124 lb 3.2 oz 126 lb 1.7 oz  Weight (kg) 56.4 kg 56.337 kg 57.2 kg      Telemetry    Rapid atrial fib - Personally Reviewed  ECG     - Personally Reviewed  Physical Exam   Physical Exam: Blood pressure 106/75, pulse 71, temperature 97.6 F (36.4 C), temperature source Oral, resp. rate 18, height 5\' 5"  (1.651 m), weight 56.4 kg, SpO2 91 %.  GEN:  elderly female, NAD  HEENT: Normal NECK: No JVD; No carotid bruits LYMPHATICS: No lymphadenopathy CARDIAC:RR   RESPIRATORY:  few basilar rales.  ABDOMEN: Soft, non-tender, non-distended MUSCULOSKELETAL:  No edema; No deformity  SKIN: Warm and dry NEUROLOGIC:  Alert and oriented x 3  Labs    High Sensitivity Troponin:  No results for input(s): TROPONINIHS in the last 720 hours.   Chemistry Recent Labs  Lab 07/09/21 1751 07/10/21 0208 07/11/21 0330 07/12/21 0225  NA 137 135 136 136  K 4.5 4.1 4.1 4.3  CL 102 99 97* 96*  CO2 24 27 29 27   GLUCOSE 81 83 70 74  BUN 12 12 16 16   CREATININE 1.19* 1.17* 1.17* 1.24*  CALCIUM 8.9 8.6* 9.0 8.8*  MG 1.8  --   --   --   PROT 6.0*  --   --   --  ALBUMIN 3.4*  --   --   --   AST 29  --   --   --   ALT 17  --   --   --   ALKPHOS 44  --   --   --   BILITOT 0.9  --   --   --   GFRNONAA 45* 45* 45* 42*  ANIONGAP 11 9 10 13      Lipids No results for input(s): CHOL, TRIG, HDL, LABVLDL, LDLCALC, CHOLHDL in the last 168 hours.  Hematology Recent Labs  Lab 07/09/21 1751  WBC 4.5  RBC 4.22  HGB 12.0  HCT 40.3  MCV 95.5  MCH 28.4  MCHC 29.8*  RDW 14.0  PLT 186    Thyroid No results for input(s): TSH, FREET4 in the last 168 hours.  BNP Recent Labs  Lab 07/09/21 1751  BNP 133.5*     DDimer No results for input(s): DDIMER in the last 168 hours.   Radiology    ECHO TEE  Result Date: 07/11/2021    TRANSESOPHOGEAL ECHO REPORT   Patient Name:   TONIYAH DILMORE Date of Exam: 07/11/2021 Medical Rec #:  [id number]      Height:       65.0 in Accession #:     [id number]     Weight:       124.2 lb Date of Birth:  November 10, 1932      BSA:          1.616 m Patient Age:    36 years       BP:           103/71 mmHg Patient Gender: F              HR:           83 bpm. Exam Location:  Inpatient Procedure: Transesophageal Echo, Color Doppler and Cardiac Doppler Indications:     I48.92* Unspecified atrial flutter  History:         Patient has prior history of Echocardiogram examinations, most                  recent 06/29/2021. Defibrillator, Arrythmias:Atrial                  Fibrillation; Risk Factors:Hypertension.  Sonographer:     Raquel Sarna Senior RDCS Referring Phys:  [place] Diagnosing Phys: Skeet Latch MD PROCEDURE: After discussion of the risks and benefits of a TEE, an informed consent was obtained from the patient. The transesophogeal probe was passed without difficulty through the esophogus of the patient. Sedation performed by different physician. The patient was monitored while under deep sedation. Anesthestetic sedation was provided intravenously by Anesthesiology: 159mg  of Propofol, 60mg  of Lidocaine. The patient's vital signs; including heart rate, blood pressure, and oxygen saturation; remained stable throughout the procedure. The patient developed no complications during the procedure. A successful direct current cardioversion was performed at 150 joules with 1 attempt. IMPRESSIONS  1. Left ventricular ejection fraction, by estimation, is 25 to 30%. The left ventricle has severely decreased function. The left ventricle demonstrates global hypokinesis.  2. Right ventricular systolic function is normal. The right ventricular size is normal.  3. No left atrial/left atrial appendage thrombus was detected. The LAA emptying velocity was 66 cm/s.  4. Moderate pleural effusion in the left lateral region.  5. The mitral valve is normal in structure. Mild mitral valve regurgitation. No evidence of mitral stenosis.  6. Tricuspid valve  regurgitation is moderate.  7.  The aortic valve is tricuspid. Aortic valve regurgitation is not visualized. No aortic stenosis is present.  8. Aortic dilatation noted. There is mild dilatation of the ascending aorta, measuring 38 mm.  9. The inferior vena cava is normal in size with greater than 50% respiratory variability, suggesting right atrial pressure of 3 mmHg. Conclusion(s)/Recommendation(s): Normal biventricular function without evidence of hemodynamically significant valvular heart disease. FINDINGS  Left Ventricle: Left ventricular ejection fraction, by estimation, is 25 to 30%. The left ventricle has severely decreased function. The left ventricle demonstrates global hypokinesis. The left ventricular internal cavity size was normal in size. There is no left ventricular hypertrophy. Right Ventricle: The right ventricular size is normal. No increase in right ventricular wall thickness. Right ventricular systolic function is normal. Left Atrium: Left atrial size was normal in size. No left atrial/left atrial appendage thrombus was detected. The LAA emptying velocity was 66 cm/s. Right Atrium: Right atrial size was normal in size. Pericardium: There is no evidence of pericardial effusion. Mitral Valve: The mitral valve is normal in structure. Mild mitral valve regurgitation. No evidence of mitral valve stenosis. Tricuspid Valve: The tricuspid valve is normal in structure. Tricuspid valve regurgitation is moderate . No evidence of tricuspid stenosis. Aortic Valve: The aortic valve is tricuspid. Aortic valve regurgitation is not visualized. No aortic stenosis is present. Pulmonic Valve: The pulmonic valve was normal in structure. Pulmonic valve regurgitation is trivial. No evidence of pulmonic stenosis. Aorta: Aortic dilatation noted. There is mild dilatation of the ascending aorta, measuring 38 mm. Venous: The inferior vena cava is normal in size with greater than 50% respiratory variability, suggesting right atrial pressure of 3 mmHg.  IAS/Shunts: No atrial level shunt detected by color flow Doppler. Additional Comments: There is a moderate pleural effusion in the left lateral region.  MR Peak grad:    94.1 mmHg MR Mean grad:    67.0 mmHg MR Vmax:         485.00 cm/s MR Vmean:        390.0 cm/s MR PISA:         1.01 cm MR PISA Eff ROA: 8 mm MR PISA Radius:  0.40 cm Skeet Latch MD Electronically signed by Skeet Latch MD Signature Date/Time: 07/11/2021/5:59:13 PM    Final     Cardiac Studies      Patient Profile     88 y.o. female  with rapid Afib, CHF   Assessment & Plan   Atrial fibrillation with rapid ventricular response:   Had TEE / CV yesterday  Currently in A-pace, V sense     2.  Acute on chronic combined systolic and diastolic congestive heart failure: Recent echocardiogram from [place]. 24, 2023 showed AF 30-35%.    BP is marginal  Still has faint rales - which might be atelectasis. Restart lasix 40 mg a day ,  kdur 10 BID Also start losartan 12.5 mg a day  BP is on the low side so I dont think she would tolerate Entresto at this point - but hopefully can be started as OP .        For questions or updates, please contact [place] Please consult [email or web address] for contact info under        Signed, Mertie Moores, MD  07/12/2021, 10:38 AM

## 2021-07-12 NOTE — Progress Notes (Signed)
Occupational Therapy Treatment Patient Details Name: Cassandra Romero MRN: [id number] DOB: 12-25-1932 Today's Date: 07/12/2021   History of present illness Patient is a 88 yo female presenting from [facility] on 07/09/21 with c/o SOB and heart palpitations. Upon work up, pt with CHF exacerbation, atrial flutter with RVR, and large left pleural effusion foud, with 400 ml fluid removed via thoracentesis. PMH includes: atrial myxoma (s/p resection in 2010), paroxysmal atrial fibrillation complicated by tachy-brady syndrome (s/p Medtronic PPM placement in 2010), HTN, HLD and newly diagnosed atrial flutter with RVR and HFrEF (diagnosed in 06/2021 and EF 30-35% by echo)   OT comments  Pt progressing towards acute OT goals. Pt recently back from walking in the halls with mobility tech. Session focused on energy conservation education for managing ADLs and on establishing BUE general strengthening HEP with level one theraband. D/c recommendation remains appropriate.   Recommendations for follow up therapy are one component of a multi-disciplinary discharge planning process, led by the attending physician.  Recommendations may be updated based on patient status, additional functional criteria and insurance authorization.    Follow Up Recommendations  No OT follow up    Assistance Recommended at Discharge PRN  Patient can return home with the following  Assistance with cooking/housework;Assist for transportation   Equipment Recommendations  None recommended by OT    Recommendations for Other Services      Precautions / Restrictions Precautions Precautions: Fall Precaution Comments: watch SpO2 Restrictions Weight Bearing Restrictions: No       Mobility Bed Mobility Overal bed mobility: Independent                  Transfers Overall transfer level: Independent                       Balance Overall balance assessment: Mild deficits observed, not formally tested                                          ADL either performed or assessed with clinical judgement   ADL Overall ADL's : Needs assistance/impaired                                       General ADL Comments: Began education on energy conservation as related to managing ADL tasks.    Extremity/Trunk Assessment Upper Extremity Assessment Upper Extremity Assessment: Overall WFL for tasks assessed;Generalized weakness   Lower Extremity Assessment Lower Extremity Assessment: Overall WFL for tasks assessed        Vision       Perception     Praxis      Cognition Arousal/Alertness: Awake/alert Behavior During Therapy: WFL for tasks assessed/performed Overall Cognitive Status: Within Functional Limits for tasks assessed                                          Exercises Exercises: Other exercises Other Exercises Other Exercises: prodived HEP and level one theraband, instructed in use for 3 seated BUE strenghtening exercises.    Shoulder Instructions       General Comments      Pertinent Vitals/ Pain       Pain Assessment Pain Assessment:  No/denies pain  Home Living                                          Prior Functioning/Environment              Frequency  Min 2X/week        Progress Toward Goals  OT Goals(current goals can now be found in the care plan section)  Progress towards OT goals: Progressing toward goals  Acute Rehab OT Goals Patient Stated Goal: To get better and get home OT Goal Formulation: With patient Time For Goal Achievement: 07/24/21 Potential to Achieve Goals: Good ADL Goals Pt Will Perform Lower Body Dressing: Independently Pt Will Transfer to Toilet: Independently;ambulating Pt/caregiver will Perform Home Exercise Program: Increased ROM;Increased strength;Both right and left upper extremity;With written HEP provided;Independently Additional ADL Goal #1: Patient will  be able to list 3 strategies to aid in energy conservation.  Plan Discharge plan remains appropriate    Co-evaluation                 AM-PAC OT "6 Clicks" Daily Activity     Outcome Measure   Help from another person eating meals?: None Help from another person taking care of personal grooming?: None Help from another person toileting, which includes using toliet, bedpan, or urinal?: None Help from another person bathing (including washing, rinsing, drying)?: A Little Help from another person to put on and taking off regular upper body clothing?: None Help from another person to put on and taking off regular lower body clothing?: A Little 6 Click Score: 22    End of Session    OT Visit Diagnosis: Unsteadiness on feet (R26.81);Other abnormalities of gait and mobility (R26.89);Muscle weakness (generalized) (M62.81)   Activity Tolerance Patient tolerated treatment well   Patient Left in bed;with call bell/phone within reach;with family/visitor present;Other (comment) (daughter)   Nurse Communication          Time: [id number] OT Time Calculation (min): 9 min  Charges: OT General Charges $OT Visit: 1 Visit OT Treatments $Therapeutic Activity: 8-22 mins  Cassandra Romero, OT Acute Rehabilitation Services Office: [phone]   Cassandra Romero 07/12/2021, 1:22 PM

## 2021-07-12 NOTE — Progress Notes (Addendum)
Went over discharge instructions with patient's and patient's daughter at the bedside. Patient's and patient's daughter made aware of medication changes and verbalize understanding. Patient also made aware of follow up appointment for cardiology. Patient has no questions for RN. Patient's PIV have been removed by NT. Tele monitor removed and CCMD notified. TOC meds in the patient room. Patient is getting dressed. Transport is in the room.

## 2021-07-12 NOTE — Discharge Summary (Addendum)
Discharge Summary    Patient ID: Cassandra Romero MRN: [id number]; DOB: 1933/02/17  Admit date: 07/09/2021 Discharge date: 07/12/2021  PCP:  Mateo Flow, MD   [facility] Providers Cardiologist:  Jenne Campus, MD   Discharge Diagnoses    Principal Problem:   Atrial flutter with rapid ventricular response [facility]) Active Problems:   Acute systolic CHF (congestive heart failure) ([place])   AKI (acute kidney injury) ([place])   Pleural effusion on left   Hypotension    Diagnostic Studies/Procedures    TEE/DCCV 07/11/2021: Procedure: After discussion of the risks and benefits of a TEE, an informed consent was obtained from the patient. The transesophogeal probe  was passed without difficulty through the esophogus of the patient.  Sedation performed by different physician. The patient was monitored while under deep sedation. Anesthestetic sedation was provided intravenously by Anesthesiology: 159mg  of Propofol,  60mg  of Lidocaine. The patient's vital signs; including heart rate, blood pressure, and oxygen saturation; remained stable throughout the procedure. The patient developed no complications during the procedure. A successful direct current cardioversion was performed at 150 joules with 1 attempt.   Impressions:  1. Left ventricular ejection fraction, by estimation, is 25 to 30%. The  left ventricle has severely decreased function. The left ventricle  demonstrates global hypokinesis.   2. Right ventricular systolic function is normal. The right ventricular  size is normal.   3. No left atrial/left atrial appendage thrombus was detected. The LAA emptying velocity was 66 cm/s.   4. Moderate pleural effusion in the left lateral region.   5. The mitral valve is normal in structure. Mild mitral valve  regurgitation. No evidence of mitral stenosis.   6. Tricuspid valve regurgitation is moderate.   7. The aortic valve is tricuspid. Aortic valve regurgitation is not  visualized. No  aortic stenosis is present.   8. Aortic dilatation noted. There is mild dilatation of the ascending  aorta, measuring 38 mm.   9. The inferior vena cava is normal in size with greater than 50%  respiratory variability, suggesting right atrial pressure of 3 mmHg.   Conclusion(s)/Recommendation(s): Normal biventricular function without evidence of hemodynamically significant valvular heart disease.  _____________   History of Present Illness     Cassandra Romero is a 88 y.o. female with a history of paroxysmal atrial fibrillation, tachy-brady syndrome s/p [employer] PPM in 2010, atrial myxoma s/p resection in 2010 hypertension, and hyperlipidemia who was transferred from [facility] on 07/09/2021 for further management of atrial flutter with RVR and acute CHF.  Patient was recently seen by Dr. Agustin Cree in 06/2021 with dyspnea on exertion and weight gain. She was noted to be tachycardic at that visit and EKG showed atrial flutter with rates in the 120s.  She was started on Lasix and IV potassium supplement.  Echo on 06/27/2021 showed LVEF of 30-35%, mildly enlarged RV and moderately reduced RV systolic function, mild MR, and mild to moderate TR.    She then presented to to [facility] on 07/06/2021 for evaluation of worsening palpitations and shortness of breath. Troponin I negative. Pro BNP elevated at 1,650. CTA negative for PE  and any coronary calcification but did show a large left pleural effusion. WBC 4.9, Hgb 12.2, Plts 163. Na 136, K 3.2, Cr 0.70. She was started on IV Lasix, IV Cardizem, and IV Heparin. She developed hypotension with this and was reportedly started on Dobutamine intermittently. She also underwent a thoracentesis with removal of 400 mL of fluid. Decision was  ultimately made to transfer her to [facility] for TEE/DCCV.  Hospital Course     Consultants: None  New Onset of Atrial Flutter with RVR History of Paroxysmal Atrial Fibrillation Tachy-Brady Syndrome s/p  PPM Patient has a history of paroxysmal atrial fibrillation and she uses to be on anticoagulation and this was reportedly stopped due to a family history of GI bleeding.  She was noted to be in atrial flutter couple weeks ago and then presented with worsening palpitations and shortness of breath.  She was started on IV Cardizem at [facility] had some hypotension with this disease (reportedly intermittently over required Dobutamine).  She was initially started on IV heparin but was transitioned to Eliquis after being transferred to [facility].  She underwent went successful TEE/DCCV on 07/11/2021.  To be discharged on Toprol XL 12.5 mg daily and Eliquis 2.5 mg twice daily (reduced dose due to age and weight).  Acute on Chronic Systolic CHF Pro BNP 1,610. Recent outpatient TTE showed LVEF of 30-35%, mildly enlarged RV and moderately reduced RV systolic function, mild MR, and mild to moderate TR. TEE this admission showed LVEF of 25-30% with normal RV. She was diuresed with IV Lasix and then transitioned back to PO Lasix. GDMT limited by hypotension. She will be discharged on Lasix 40mg  daily (with potassium chloride 10 mEq) and Toprol-XL 12.5mg  daily. We tried to start low dose Losartan on day of discharge but she was unable to tolerate this from a BP standpoint (systolic BP dropped to 96E). She was asymptomatic with this and BP improved on repeat check so she was felt to be stable for discharge. She has close outpatient follow-up arranged later this week. Will need repeat BMET at that time.  Left Pleural Effusion Patient was noted to have a a large left pleural effusion on CTA at [facility]. She underwent a thoracentesis with removal of 460mL of fluid. Repeat chest x-ray here at [facility] showed no edema, mildly improved lung volumes with mildly regressed pleural effusion and mildly improved lung base ventilation. Continue PO Lasix as above.  Hypotension Patient developed hypotension at  [facility] after being started on IV Lasix and IV Cardizem. She reportedly briefly required Dobutamine. BP still soft but stable. Will be discharged on low dose Toprol-XL as above given atrial flutter.  AKI Creatinine 0.7 on admission but trended up to 1.24 on day of discharge. Likely secondary to diuresis and soft BP. Will need repeat BMET at follow-up visit later this week.  Patient was seen and examined by Dr. Acie Fredrickson today and determined to be stable for discharge. Outpatient follow-up arranged. Medications as below.   Did the patient have an acute coronary syndrome (MI, NSTEMI, STEMI, etc) this admission?:  No                               Did the patient have a percutaneous coronary intervention (stent / angioplasty)?:  No.    _____________  Discharge Vitals Blood pressure 112/86, pulse 74, temperature (!) 97.5 F (36.4 C), temperature source Oral, resp. rate 20, height 5\' 5"  (1.651 m), weight 56.4 kg, SpO2 91 %.  Filed Weights   07/10/21 0006 07/11/21 0600 07/12/21 0600  Weight: 57.2 kg 56.3 kg 56.4 kg    Labs & Radiologic Studies    CBC Recent Labs    07/09/21 1751  WBC 4.5  NEUTROABS 2.3  HGB 12.0  HCT 40.3  MCV 95.5  PLT 370   Basic Metabolic Panel Recent Labs    07/09/21 1751 07/10/21 0208 07/11/21 0330 07/12/21 0225  NA 137   < > 136 136  K 4.5   < > 4.1 4.3  CL 102   < > 97* 96*  CO2 24   < > 29 27  GLUCOSE 81   < > 70 74  BUN 12   < > 16 16  CREATININE 1.19*   < > 1.17* 1.24*  CALCIUM 8.9   < > 9.0 8.8*  MG 1.8  --   --   --    < > = values in this interval not displayed.   Liver Function Tests Recent Labs    07/09/21 1751  AST 29  ALT 17  ALKPHOS 44  BILITOT 0.9  PROT 6.0*  ALBUMIN 3.4*   No results for input(s): LIPASE, AMYLASE in the last 72 hours. High Sensitivity Troponin:   No results for input(s): TROPONINIHS in the last 720 hours.  BNP Invalid input(s): POCBNP D-Dimer No results for input(s): DDIMER in the last 72  hours. Hemoglobin A1C No results for input(s): HGBA1C in the last 72 hours. Fasting Lipid Panel No results for input(s): CHOL, HDL, LDLCALC, TRIG, CHOLHDL, LDLDIRECT in the last 72 hours. Thyroid Function Tests No results for input(s): TSH, T4TOTAL, T3FREE, THYROIDAB in the last 72 hours.  Invalid input(s): FREET3 _____________  Darletta Moll Chest Port 1 View  Result Date: 07/10/2021 CLINICAL DATA:  88 year old female with pulmonary edema, shortness of breath and weakness. EXAM: PORTABLE CHEST 1 VIEW COMPARISON:  Chest CTA 07/06/2021. Portable chest 07/27/2021 and earlier. FINDINGS: Portable AP semi upright view at 0552 hours. Regressed pleural effusion, both the layering effusion on the left and fluid in the fissures on the right since 07/06/2021. Mildly improved lung volumes and lung base ventilation. No overt edema. Additional streaky bilateral pulmonary opacity most resembles atelectasis or scarring. No pneumothorax. Stable cardiac size and mediastinal contours. Stable left chest cardiac pacemaker. Calcified aortic atherosclerosis. Paucity of bowel gas in the upper abdomen. Visualized tracheal air column is within normal limits. No acute osseous abnormality identified. IMPRESSION: 1. Mildly improved lung volumes, mildly regressed pleural effusions, and mildly improved lung base ventilation since 07/06/2021. 2. No overt edema.  No new cardiopulmonary abnormality. Electronically Signed   By: Genevie Ann M.D.   On: 07/10/2021 08:16   ECHOCARDIOGRAM COMPLETE  Result Date: 06/29/2021    ECHOCARDIOGRAM REPORT   Patient Name:   Cassandra Romero Date of Exam: 06/27/2021 Medical Rec #:  [id number]      Height:       65.0 in Accession #:    [id number]     Weight:       133.6 lb Date of Birth:  1932-09-15      BSA:          1.666 m Patient Age:    38 years       BP:           134/76 mmHg Patient Gender: F              HR:           125 bpm. Exam Location:  [place] Procedure: 2D Echo, Cardiac Doppler and Color Doppler  Indications:    SICK SINUS/ TACHY-BRADY SYNDROME [I49.5 (ICD-10-CM)]  History:        Patient has prior history of Echocardiogram examinations, most  recent 06/24/2019. Pacemaker; Signs/Symptoms:Dyspnea.  Sonographer:    Philipp Deputy RDCS Referring Phys: [id number] [place] Liter  Sonographer Comments: Technically difficult study due to poor echo windows. IMPRESSIONS  1. Resting tachycardia at 125 BPM, possible atrial flutter. Left ventricular ejection fraction, by estimation, is 30 to 35%. The left ventricle has moderate to severely decreased function. Left ventricular endocardial border not optimally defined to evaluate regional wall motion. Left ventricular diastolic parameters are indeterminate.  2. RV > LV size. Right ventricular systolic function is moderately reduced. The right ventricular size is mildly enlarged.  3. The mitral valve is normal in structure. Mild mitral valve regurgitation. No evidence of mitral stenosis.  4. TR is not well defined, color doppler splay is noted     TR may be underestimated. Tricuspid valve regurgitation is mild to moderate.  5. Tachycardia at 125 BPM. The aortic valve is tricuspid. Aortic valve regurgitation is not visualized. Aortic valve sclerosis is present, with no evidence of aortic valve stenosis.  6. The inferior vena cava is dilated in size with <50% respiratory variability, suggesting right atrial pressure of 15 mmHg. FINDINGS  Left Ventricle: Resting tachycardia at 125 BPM, possible atrial flutter. Left ventricular ejection fraction, by estimation, is 30 to 35%. The left ventricle has moderate to severely decreased function. Left ventricular endocardial border not optimally defined to evaluate regional wall motion. The left ventricular internal cavity size was normal in size. There is no left ventricular hypertrophy. Left ventricular diastolic parameters are indeterminate. Right Ventricle: RV > LV size. The right ventricular size is mildly  enlarged. No increase in right ventricular wall thickness. Right ventricular systolic function is moderately reduced. Left Atrium: Left atrial size was normal in size. Right Atrium: Right atrial size was normal in size. Pericardium: There is no evidence of pericardial effusion. Mitral Valve: The mitral valve is normal in structure. Mild mitral valve regurgitation. No evidence of mitral valve stenosis. Tricuspid Valve: TR is not well defined, color doppler splay is noted TR may be underestimated. The tricuspid valve is normal in structure. Tricuspid valve regurgitation is mild to moderate. No evidence of tricuspid stenosis. Aortic Valve: Tachycardia at 125 BPM. The aortic valve is tricuspid. Aortic valve regurgitation is not visualized. Aortic valve sclerosis is present, with no evidence of aortic valve stenosis. Pulmonic Valve: The pulmonic valve was not well visualized. Pulmonic valve regurgitation is not visualized. No evidence of pulmonic stenosis. Aorta: The aortic root and ascending aorta are structurally normal, with no evidence of dilitation and the aortic arch was not well visualized. Venous: The pulmonary veins were not well visualized. The inferior vena cava is dilated in size with less than 50% respiratory variability, suggesting right atrial pressure of 15 mmHg. IAS/Shunts: No atrial level shunt detected by color flow Doppler. Additional Comments: A device lead is visualized in the right atrium and right ventricle.  LEFT VENTRICLE PLAX 2D LVIDd:         4.10 cm Diastology LVIDs:         3.60 cm LV e' medial:    10.40 cm/s LV PW:         0.80 cm LV E/e' medial:  12.3 LV IVS:        0.80 cm LV e' lateral:   12.60 cm/s                        LV E/e' lateral: 10.2  RIGHT VENTRICLE  IVC RV Basal diam:  2.70 cm  IVC diam: 2.80 cm LEFT ATRIUM             Index        RIGHT ATRIUM           Index LA diam:        3.20 cm 1.92 cm/m   RA Area:     11.50 cm LA Vol (A2C):   19.2 ml 11.52 ml/m  RA Volume:    23.00 ml  13.80 ml/m LA Vol (A4C):   20.6 ml 12.36 ml/m LA Biplane Vol: 21.7 ml 13.02 ml/m  AORTIC VALVE LVOT Vmax:   70.80 cm/s LVOT Vmean:  43.100 cm/s LVOT VTI:    0.113 m  AORTA Ao Root diam: 2.80 cm Ao Asc diam:  3.10 cm MITRAL VALVE MV Area (PHT): 5.84 cm     SHUNTS MV Decel Time: 130 msec     Systemic VTI: 0.11 m MR Peak grad: 101.6 mmHg MR Mean grad: 67.0 mmHg MR Vmax:      504.00 cm/s MR Vmean:     388.0 cm/s MV E velocity: 128.00 cm/s Shirlee More MD Electronically signed by Shirlee More MD Signature Date/Time: 06/29/2021/6:44:20 PM    Final    ECHO TEE  Result Date: 07/11/2021    TRANSESOPHOGEAL ECHO REPORT   Patient Name:   LESHAE MCCLAY Date of Exam: 07/11/2021 Medical Rec #:  [id number]      Height:       65.0 in Accession #:    [id number]     Weight:       124.2 lb Date of Birth:  09-May-1933      BSA:          1.616 m Patient Age:    54 years       BP:           103/71 mmHg Patient Gender: F              HR:           83 bpm. Exam Location:  Inpatient Procedure: Transesophageal Echo, Color Doppler and Cardiac Doppler Indications:     I48.92* Unspecified atrial flutter  History:         Patient has prior history of Echocardiogram examinations, most                  recent 06/29/2021. Defibrillator, Arrythmias:Atrial                  Fibrillation; Risk Factors:Hypertension.  Sonographer:     Raquel Sarna Senior RDCS Referring Phys:  [place] Diagnosing Phys: Skeet Latch MD PROCEDURE: After discussion of the risks and benefits of a TEE, an informed consent was obtained from the patient. The transesophogeal probe was passed without difficulty through the esophogus of the patient. Sedation performed by different physician. The patient was monitored while under deep sedation. Anesthestetic sedation was provided intravenously by Anesthesiology: 159mg  of Propofol, 60mg  of Lidocaine. The patient's vital signs; including heart rate, blood pressure, and oxygen saturation; remained stable throughout  the procedure. The patient developed no complications during the procedure. A successful direct current cardioversion was performed at 150 joules with 1 attempt. IMPRESSIONS  1. Left ventricular ejection fraction, by estimation, is 25 to 30%. The left ventricle has severely decreased function. The left ventricle demonstrates global hypokinesis.  2. Right ventricular systolic function is normal. The right ventricular size is normal.  3. No left atrial/left  atrial appendage thrombus was detected. The LAA emptying velocity was 66 cm/s.  4. Moderate pleural effusion in the left lateral region.  5. The mitral valve is normal in structure. Mild mitral valve regurgitation. No evidence of mitral stenosis.  6. Tricuspid valve regurgitation is moderate.  7. The aortic valve is tricuspid. Aortic valve regurgitation is not visualized. No aortic stenosis is present.  8. Aortic dilatation noted. There is mild dilatation of the ascending aorta, measuring 38 mm.  9. The inferior vena cava is normal in size with greater than 50% respiratory variability, suggesting right atrial pressure of 3 mmHg. Conclusion(s)/Recommendation(s): Normal biventricular function without evidence of hemodynamically significant valvular heart disease. FINDINGS  Left Ventricle: Left ventricular ejection fraction, by estimation, is 25 to 30%. The left ventricle has severely decreased function. The left ventricle demonstrates global hypokinesis. The left ventricular internal cavity size was normal in size. There is no left ventricular hypertrophy. Right Ventricle: The right ventricular size is normal. No increase in right ventricular wall thickness. Right ventricular systolic function is normal. Left Atrium: Left atrial size was normal in size. No left atrial/left atrial appendage thrombus was detected. The LAA emptying velocity was 66 cm/s. Right Atrium: Right atrial size was normal in size. Pericardium: There is no evidence of pericardial effusion. Mitral  Valve: The mitral valve is normal in structure. Mild mitral valve regurgitation. No evidence of mitral valve stenosis. Tricuspid Valve: The tricuspid valve is normal in structure. Tricuspid valve regurgitation is moderate . No evidence of tricuspid stenosis. Aortic Valve: The aortic valve is tricuspid. Aortic valve regurgitation is not visualized. No aortic stenosis is present. Pulmonic Valve: The pulmonic valve was normal in structure. Pulmonic valve regurgitation is trivial. No evidence of pulmonic stenosis. Aorta: Aortic dilatation noted. There is mild dilatation of the ascending aorta, measuring 38 mm. Venous: The inferior vena cava is normal in size with greater than 50% respiratory variability, suggesting right atrial pressure of 3 mmHg. IAS/Shunts: No atrial level shunt detected by color flow Doppler. Additional Comments: There is a moderate pleural effusion in the left lateral region.  MR Peak grad:    94.1 mmHg MR Mean grad:    67.0 mmHg MR Vmax:         485.00 cm/s MR Vmean:        390.0 cm/s MR PISA:         1.01 cm MR PISA Eff ROA: 8 mm MR PISA Radius:  0.40 cm Skeet Latch MD Electronically signed by Skeet Latch MD Signature Date/Time: 07/11/2021/5:59:13 PM    Final    CUP PACEART REMOTE DEVICE CHECK  Result Date: 07/06/2021 Scheduled remote reviewed. Normal device function.  The patient has been in a persistent atrial fib/flutter since 05/12/2021, ? [place], there is not good ventricular rate control as rates are 120-160s bpm.  There appears to be some atrial undersensing at times.  There were 5 ventricular high rates that appear to be AF/AFL with rapid ventricular rates or a t rue VT, see episode 06/14/2021.  Sent to triage. Next remote 91 days. Kathy Breach, RN, CCDS, [employer]  Disposition   Patient is being discharged home today in good condition.  Follow-up Plans & Appointments     Follow-up Information     [place] Liter, MD Follow up.   Specialty:  Cardiology Why: Please keep visit with Dr. Agustin Cree later this week on 07/14/2021 at 2:00pm. Contact information: [address] [place] [address] [phone]  Discharge Instructions     Amb Referral to Phase II Cardiac  Rehab   Complete by: As directed    Diagnosis: Heart Failure (see criteria below if ordering Phase II)   Heart Failure Type: Chronic Systolic & Diastolic   After initial evaluation and assessments completed: Virtual Based Care may be provided alone or in conjunction with Phase 2 Cardiac Rehab based on patient barriers.: Yes   Diet - low sodium heart healthy   Complete by: As directed    Increase activity slowly   Complete by: As directed        Discharge Medications   Allergies as of 07/12/2021       Reactions   Aspirin    Makes her feel sick   Alendronate Sodium    REACTION: extreme fatigue/vision change   Amiodarone Hcl Nausea And Vomiting   Bactrim [sulfamethoxazole-trimethoprim] Other (See Comments)   Abdominal pain   Ibandronate Sodium    REACTION: extreme fatigue/vision changes   Risedronate Sodium    REACTION: extreme fatigue/vision   Statins Other (See Comments)   Myalgia   Vimovo [naproxen-esomeprazole Mg] Other (See Comments)   Leg Cramps   Xarelto [rivaroxaban]    SEVERE ANEMIA   Metaxalone Rash   Mobic [meloxicam] Rash   Toprol Xl [metoprolol] Rash        Medication List     TAKE these medications    acetaminophen 500 MG tablet Commonly known as: TYLENOL Take 500 mg by mouth every 6 (six) hours as needed for mild pain.   apixaban 2.5 MG Tabs tablet Commonly known as: ELIQUIS Take 1 tablet (2.5 mg total) by mouth 2 (two) times daily.   famotidine 20 MG tablet Commonly known as: PEPCID Take 20 mg by mouth 2 (two) times daily as needed for heartburn or indigestion.   furosemide 40 MG tablet Commonly known as: LASIX Take 1 tablet (40 mg total) by mouth daily.   metoprolol succinate 25 MG 24 hr  tablet Commonly known as: TOPROL-XL Take 1/2 tablet (12.5 mg total) by mouth daily. Start taking on: July 13, 2021   potassium chloride 10 MEQ tablet Commonly known as: KLOR-CON Take 1 tablet (10 mEq total) by mouth daily.           Outstanding Labs/Studies   Repeat BMET at follow-up visit.  Duration of Discharge Encounter   Greater than 30 minutes including physician time.  Signed, Darreld Mclean, PA-C 07/12/2021, 1:49 PM  Attending Note:   The patient was seen and examined.  Agree with assessment and plan as noted above.  Changes made to the above note as needed.  Patient seen and independently examined with Sande Rives, PA .   We discussed all aspects of the encounter. I agree with the assessment and plan as stated above.    Atrial fibrillation with rapid ventricular response:   Had TEE / CV yesterday  Currently in A-pace, V sense        2.  Acute on chronic combined systolic and diastolic congestive heart failure: Recent echocardiogram from [place]. 24, 2023 showed AF 30-35%.    BP is marginal  Still has faint rales - which might be atelectasis. Restart lasix 40 mg a day ,  kdur 10 BID Also start losartan 12.5 mg a day  BP is on the low side so I dont think she would tolerate Entresto at this point - but hopefully can be started as OP .      I have spent  a total of 40 minutes with patient reviewing hospital  notes , telemetry, EKGs, labs and examining patient as well as establishing an assessment and plan that was discussed with the patient.  > 50% of time was spent in direct patient care.    Thayer Headings, Brooke Bonito., MD, [facility] 07/20/2021, 9:33 AM [address]  [place] Pager [phone]

## 2021-07-13 ENCOUNTER — Telehealth: Payer: Self-pay | Admitting: Cardiology

## 2021-07-13 ENCOUNTER — Encounter (HOSPITAL_COMMUNITY): Payer: Self-pay | Admitting: Cardiovascular Disease

## 2021-07-13 NOTE — Telephone Encounter (Signed)
Cassandra Romero from [facility] call saying she got a referral for cardiac rehab for this patient but she sees she lives in [place]. If Dr. Rejeana Brock could be in a referral for cardiac rehab closer to home for the patient. She was going to close out the one for [facility].

## 2021-07-13 NOTE — Telephone Encounter (Signed)
Ok to refer to cardiac rehab at [facility]?

## 2021-07-13 NOTE — Telephone Encounter (Signed)
Patient was schedule tomorrow.

## 2021-07-14 ENCOUNTER — Encounter: Payer: Self-pay | Admitting: Cardiology

## 2021-07-14 ENCOUNTER — Ambulatory Visit: Payer: Medicare PPO | Admitting: Cardiology

## 2021-07-14 ENCOUNTER — Other Ambulatory Visit: Payer: Self-pay

## 2021-07-14 VITALS — BP 134/82 | HR 73 | Ht 65.0 in | Wt 127.3 lb

## 2021-07-14 DIAGNOSIS — I420 Dilated cardiomyopathy: Secondary | ICD-10-CM

## 2021-07-14 DIAGNOSIS — D151 Benign neoplasm of heart: Secondary | ICD-10-CM

## 2021-07-14 DIAGNOSIS — I10 Essential (primary) hypertension: Secondary | ICD-10-CM

## 2021-07-14 DIAGNOSIS — I4891 Unspecified atrial fibrillation: Secondary | ICD-10-CM

## 2021-07-14 HISTORY — DX: Dilated cardiomyopathy: I42.0

## 2021-07-14 NOTE — Patient Instructions (Signed)
Medication Instructions:  Your physician recommends that you continue on your current medications as directed. Please refer to the Current Medication list given to you today.  *If you need a refill on your cardiac medications before your next appointment, please call your pharmacy*   Lab Work: Your physician recommends that you return for lab work in: Labs today: BMP If you have labs (blood work) drawn today and your tests are completely normal, you will receive your results only by: [place] (if you have [place]) OR A paper copy in the mail If you have any lab test that is abnormal or we need to change your treatment, we will call you to review the results.   Testing/Procedures: None   Follow-Up: At [facility], you and your health needs are our priority.  As part of our continuing mission to provide you with exceptional heart care, we have created designated Provider Care Teams.  These Care Teams include your primary Cardiologist (physician) and Advanced Practice Providers (APPs -  Physician Assistants and Nurse Practitioners) who all work together to provide you with the care you need, when you need it.  We recommend signing up for the patient portal called "MyChart".  Sign up information is provided on this After Visit Summary.  MyChart is used to connect with patients for Virtual Visits (Telemedicine).  Patients are able to view lab/test results, encounter notes, upcoming appointments, etc.  Non-urgent messages can be sent to your provider as well.   To learn more about what you can do with MyChart, go to [email or web address].    Your next appointment:   2 week(s)  The format for your next appointment:   In Person  Provider:   Jenne Campus, MD    Other Instructions None

## 2021-07-14 NOTE — Progress Notes (Signed)
kg

## 2021-07-14 NOTE — Progress Notes (Signed)
Cardiology Office Note:    Date:  07/14/2021   ID:  Cassandra Romero, DOB Jun 11, 1932, MRN [id number]  PCP:  Mateo Flow, MD  Cardiologist:  Jenne Campus, MD    Referring MD: Mateo Flow, MD   Chief Complaint  Patient presents with   [facility] folllow   I am feeling better shortness of breath dramatically improved  History of Present Illness:    Cassandra Romero is a 88 y.o. female   with a history of paroxysmal atrial fibrillation, tachy-brady syndrome s/p [employer] PPM in 2010, atrial myxoma s/p resection in 2010 hypertension, and hyperlipidemia who was transferred from [facility] on 07/09/2021 for further management of atrial flutter with RVR and acute CHF.   Patient was recently seen by Dr. Agustin Cree in 06/2021 with dyspnea on exertion and weight gain. She was noted to be tachycardic at that visit and EKG showed atrial flutter with rates in the 120s.  She was started on Lasix and IV potassium supplement.  Echo on 06/27/2021 showed LVEF of 30-35%, mildly enlarged RV and moderately reduced RV systolic function, mild MR, and mild to moderate TR.     She then presented to to [facility] on 07/06/2021 for evaluation of worsening palpitations and shortness of breath. Troponin I negative. Pro BNP elevated at 1,650. CTA negative for PE  and any coronary calcification but did show a large left pleural effusion. WBC 4.9, Hgb 12.2, Plts 163. Na 136, K 3.2, Cr 0.70. She was started on IV Lasix, IV Cardizem, and IV Heparin. She developed hypotension with this and was reportedly started on Dobutamine intermittently. She also underwent a thoracentesis with removal of 400 mL of fluid. Decision was ultimately made to transfer her to [facility] for TEE/D[facility]. Eventually she ended up having cardioversion done.  Then gradually we tried to put her on appropriate medications however difficulties blood pressure being low. She is in my office today she says she feels much better shortness of breath improved  there is no swelling of lower extremities she feels good looks good.  Denies have any chest pain tightness squeezing pressure burning chest  Past Medical History:  Diagnosis Date   AICD (automatic cardioverter/defibrillator) present 2010   [place]   Atrial fibrillation ([place]) 01/27/2009   Centricity Description: BRADYCARDIA Qualifier: Diagnosis of  By: Darrick Meigs   Centricity Description: ATRIAL ARRHYTHMIAS Qualifier: Diagnosis of  By: Darrick Meigs     Atrial myxoma    s/p resection   ATRIAL MYXOMA 01/27/2009   Qualifier: Diagnosis of  By: Darrick Meigs     Chronic kidney disease    kidney tumor   Congestive heart failure ([place]) 06/27/2021   Essential hypertension 07/20/2010   Qualifier: Diagnosis of  By: Rayann Heman, MD, [place]     Fibrocystic breast changes of both breasts 09/19/2015   GERD 01/27/2009   Qualifier: Diagnosis of  By: Darrick Meigs     Hypertension    Neoplasm of uncertain behavior of skin 04/15/2018   Neurofibroma of back 03/10/2019   OSTEOPENIA 01/27/2009   Qualifier: Diagnosis of  By: Darrick Meigs     Paroxysmal atrial fibrillation [facility])    Renal benign neoplasm, right 09/28/2020   Right renal mass    Seasonal allergies 09/10/2020   nonproductive cough   SICK SINUS/ TACHY-BRADY SYNDROME 01/27/2009   Qualifier: Diagnosis of  By: Darrick Meigs     Unspecified open wound, right lower leg, sequela 04/16/2016   URI (upper respiratory infection) 09/07/2020  WITH COLD AND COUGH , TOOK 5 DAYS OF ANTIBIOTICS ALL ISSUES RESOLVED PER PT   [place] 06/13/2020   Wears glasses    Wears hearing aid in both ears     Past Surgical History:  Procedure Laterality Date   [place]  12/24/2008   Left -- Dr Roxy Manns   BREAST SURGERY  1 ON RIGHT 2 ON LEFT   BIOPSY LAST DONE 1973, AND X 2 1960'S   CARDIOVERSION N/A 07/11/2021   Procedure: CARDIOVERSION;  Surgeon: Skeet Latch, MD;  Location: [place];  Service:  Cardiovascular;  Laterality: N/A;   CHOLECYSTECTOMY  2010   LAPAPROSCIPIC   CYSTOSCOPY W/ URETERAL STENT PLACEMENT Right 09/27/2020   Procedure: CYSTOSCOPY WITH RETROGRADE PYELOGRAM/URETERAL STENT PLACEMENT;  Surgeon: Ceasar Mons, MD;  Location: [facility];  Service: Urology;  Laterality: Right;   IR RADIOLOGIST EVAL & MGMT  08/03/2020   IR RADIOLOGIST EVAL & MGMT  03/07/2021   PACEMAKER INSERTION  02/02/2009   PARTIAL HYSTERECTOMY  1973   STILL HAS OVARIES   RADIOLOGY WITH ANESTHESIA Right 09/28/2020   Procedure: IR WITH ANESTHESIA CT CRYOABLATION;  Surgeon: Criselda Peaches, MD;  Location: [facility];  Service: Anesthesiology;  Laterality: Right;   stapendectomy Bilateral 1970's   TEE WITHOUT CARDIOVERSION N/A 07/11/2021   Procedure: TRANSESOPHAGEAL ECHOCARDIOGRAM (TEE);  Surgeon: Skeet Latch, MD;  Location[facility] ENDOSCOPY;  Service: Cardiovascular;  Laterality: N/A;    Current Medications: Current Meds  Medication Sig   acetaminophen (TYLENOL) 500 MG tablet Take 500 mg by mouth every 6 (six) hours as needed for mild pain.   apixaban (ELIQUIS) 2.5 MG TABS tablet Take 1 tablet (2.5 mg total) by mouth 2 (two) times daily.   famotidine (PEPCID) 20 MG tablet Take 20 mg by mouth 2 (two) times daily as needed for heartburn or indigestion.   furosemide (LASIX) 40 MG tablet Take 1 tablet (40 mg total) by mouth daily. (Patient taking differently: Take 20 mg by mouth daily.)   metoprolol succinate (TOPROL-XL) 25 MG 24 hr tablet Take 1/2 tablet (12.5 mg total) by mouth daily.   potassium chloride (KLOR-CON) 10 MEQ tablet Take 1 tablet (10 mEq total) by mouth daily.     Allergies:   Aspirin, Alendronate sodium, Amiodarone hcl, Bactrim [sulfamethoxazole-trimethoprim], Ibandronate sodium, Risedronate sodium, Statins, Vimovo [naproxen-esomeprazole mg], Xarelto [rivaroxaban], Metaxalone, Mobic [meloxicam], and Toprol xl [metoprolol]   Social History   Socioeconomic  History   Marital status: Married    Spouse name: Not on file   Number of children: Not on file   Years of education: Not on file   Highest education level: Not on file  Occupational History   Occupation: retired Pharmacist, hospital  Tobacco Use   Smoking status: Former    Packs/day: 0.25    Years: 12.00    Pack years: 3.00    Types: Cigarettes    Quit date: 06/04/1988    Years since quitting: 33.1   Smokeless tobacco: Never  Vaping Use   Vaping Use: Never used  Substance and Sexual Activity   Alcohol use: Not Currently   Drug use: No   Sexual activity: Not on file  Other Topics Concern   Not on file  Social History Narrative   Lives in [place] Determinants of Health   Financial Resource Strain: Not on file  Food Insecurity: Not on file  Transportation Needs: Not on file  Physical Activity: Not on  file  Stress: Not on file  Social Connections: Not on file     Family History: The patient's family history includes Coronary artery disease in an other family member; Dementia in her mother and another family member; Heart attack (age of onset: 42) in her father; Heart disease in her father; Pancreatic cancer in her mother and another family member; Thyroid disease in her mother; Transient ischemic attack in her mother. ROS:   Please see the history of present illness.    All 14 point review of systems negative except as described per history of present illness  EKGs/Labs/Other Studies Reviewed:      Recent Labs: 06/27/2021: NT-Pro BNP 1,361 07/09/2021: ALT 17; B Natriuretic Peptide 133.5; Hemoglobin 12.0; Magnesium 1.8; Platelets 186 07/12/2021: BUN 16; Creatinine, Ser 1.24; Potassium 4.3; Sodium 136  Recent Lipid Panel    Component Value Date/Time   CHOL  12/22/2008 0150    146        ATP III CLASSIFICATION:  <200     mg/dL   Desirable  200-239  mg/dL   Borderline High  >=240    mg/dL   High          TRIG 83 12/22/2008 0150   HDL 36 (L) 12/22/2008 0150   CHOLHDL 4.1  12/22/2008 0150   VLDL 17 12/22/2008 0150   LDLCALC  12/22/2008 0150    93        Total Cholesterol/HDL:CHD Risk Coronary Heart Disease Risk Table                     Men   Women  1/2 Average Risk   3.4   3.3  Average Risk       5.0   4.4  2 X Average Risk   9.6   7.1  3 X Average Risk  23.4   11.0        Use the calculated Patient Ratio above and the CHD Risk Table to determine the patient's CHD Risk.        ATP III CLASSIFICATION (LDL):  <100     mg/dL   Optimal  100-129  mg/dL   Near or Above                    Optimal  130-159  mg/dL   Borderline  160-189  mg/dL   High  >190     mg/dL   Very High    Physical Exam:    VS:  BP 134/82 (BP Location: Left Arm, Patient Position: Sitting)    Pulse 73    Ht 5\' 5"  (1.651 m)    Wt 127 lb 4.8 oz (57.7 kg)    SpO2 94%    BMI 21.18 kg/m     Wt Readings from Last 3 Encounters:  07/14/21 127 lb 4.8 oz (57.7 kg)  07/12/21 124 lb 5.4 oz (56.4 kg)  06/27/21 133 lb 9.6 oz (60.6 kg)     GEN:  Well nourished, well developed in no acute distress HEENT: Normal NECK: No JVD; No carotid bruits LYMPHATICS: No lymphadenopathy CARDIAC: RRR, no murmurs, no rubs, no gallops RESPIRATORY:  Clear to auscultation without rales, wheezing or rhonchi  ABDOMEN: Soft, non-tender, non-distended MUSCULOSKELETAL:  No edema; No deformity  SKIN: Warm and dry LOWER EXTREMITIES: no swelling NEUROLOGIC:  Alert and oriented x 3 PSYCHIATRIC:  Normal affect   ASSESSMENT:    1. Atrial fibrillation, unspecified type ([place])   2. Dilated cardiomyopathy ([place])  ejection fraction 25 to 30% in 2023   3. Essential hypertension   4. Atrial myxoma s/p resection done more than 20 years ago    PLAN:    In order of problems listed above:  Dilated cardiomyopathy ejection fraction 25 to 30%.  She is only on diuretics Lasix 20 mg and potassium I will check Chem-7 today.  Difficulty is her blood pressure below however today in the office 134/82 so I hope I have some room  to start adding ARB to her medical regimen.  Chem-7 will be checked if Chem-7 is reasonable then will start losartan 25 mg daily. Atrial fibrillation paroxysmal actually last time he was in atrial flutter.  We have to transfer her to [facility] and perform cardioversion after TEE has been performed.  He tolerated procedure well and looks like she is looking good.  She is anticoagulant with Eliquis which I will continue.  Today she showed normal sinus rhythm Essential hypertension blood pressure acceptable today continue present management History of atrial myxoma status postsurgery many years ago, does not have any reactivation of the problem based on last echocardiogram and transesophageal echocardiogram that was done recently.  Plan is to gradually put her on appropriate medication hopefully will create improvement in left ventricle ejection fraction, the biggest difficulty we have is her blood pressure being low.  Plan today is to check her Chem-7 if Chem-7 is fine then I will initiate 25 mg losartan I see her back in my office in about a month or so   Medication Adjustments/Labs and Tests Ordered: Current medicines are reviewed at length with the patient today.  Concerns regarding medicines are outlined above.  Orders Placed This Encounter  Procedures   Basic Metabolic Panel (BMET)   EKG 12-Lead   Medication changes: No orders of the defined types were placed in this encounter.   Signed, Park Liter, MD, [facility] 07/14/2021 2:54 PM    [place]

## 2021-07-14 NOTE — Telephone Encounter (Signed)
Spoke to Dr. Agustin Cree regarding referring the patient to cardiac rehab. Dr. Agustin Cree wanted to wait on sending her to cardiac rehab until she is feeling a little better.

## 2021-07-15 LAB — BASIC METABOLIC PANEL
BUN/Creatinine Ratio: 15 (ref 12–28)
BUN: 17 mg/dL (ref 8–27)
CO2: 34 mmol/L — ABNORMAL HIGH (ref 20–29)
Calcium: 9.7 mg/dL (ref 8.7–10.3)
Chloride: 101 mmol/L (ref 96–106)
Creatinine, Ser: 1.13 mg/dL — ABNORMAL HIGH (ref 0.57–1.00)
Glucose: 87 mg/dL (ref 70–99)
Potassium: 4.2 mmol/L (ref 3.5–5.2)
Sodium: 142 mmol/L (ref 134–144)
eGFR: 47 mL/min/{1.73_m2} — ABNORMAL LOW (ref >59)

## 2021-07-17 ENCOUNTER — Telehealth: Payer: Self-pay | Admitting: Cardiology

## 2021-07-17 NOTE — Telephone Encounter (Signed)
Pt c/o medication issue:  1. Name of Medication: metoprolol succinate (TOPROL-XL) 25 MG 24 hr tablet  2. How are you currently taking this medication (dosage and times per day)? Take 1/2 tablet (12.5 mg total) by mouth daily.  3. Are you having a reaction (difficulty breathing--STAT)?   4. What is your medication issue? Patient is a CHF patient, they know this medication is to treat high BP and she is on lasix. She is concerned since this is to treat BP, what happens when her BP gets too low.  She doesn't want to take this medication until she speaks to Dr. Agustin Cree about this.

## 2021-07-18 NOTE — Addendum Note (Signed)
Addended by: Darrel Reach on: 07/18/2021 10:29 AM   Modules accepted: Orders

## 2021-07-18 NOTE — Telephone Encounter (Addendum)
Patient notified of med change per Dr. Raliegh Ip to start Metoprolol succ 25 mg qd. She also reports she loss  weight about 1/2 lb in one day and feels fatigue. She asked does her Laxis need to be decreased. Message sent to DOD Dr. Geraldo Pitter

## 2021-07-18 NOTE — Telephone Encounter (Signed)
Patient aware of nurse visit tomorrow at 1pm for EKG and vitals per RRR

## 2021-07-19 ENCOUNTER — Other Ambulatory Visit: Payer: Self-pay

## 2021-07-19 ENCOUNTER — Ambulatory Visit (INDEPENDENT_AMBULATORY_CARE_PROVIDER_SITE_OTHER): Payer: Medicare PPO

## 2021-07-19 VITALS — BP 130/84 | HR 70 | Wt 116.4 lb

## 2021-07-19 DIAGNOSIS — I5021 Acute systolic (congestive) heart failure: Secondary | ICD-10-CM

## 2021-07-19 NOTE — Progress Notes (Signed)
Reason for visit: Check EKG. Patient started on lasix and feeling fatigued and is concerned about weight loss.  Name of MD requesting visit: Dr. Geraldo Pitter  H&P: Check EKG. Patient started on lasix and feeling fatigued and is concerned about weight loss.  ROS related to problem: Check EKG. Patient started on lasix and feeling fatigued and is concerned about weight loss.  Assessment and plan per MD: Patient should continue taking lasix and follow up with Dr. Agustin Cree

## 2021-07-19 NOTE — Addendum Note (Signed)
Addended by: Darrel Reach on: 07/19/2021 02:24 PM   Modules accepted: Orders

## 2021-07-20 ENCOUNTER — Telehealth (HOSPITAL_COMMUNITY): Payer: Self-pay

## 2021-07-20 ENCOUNTER — Other Ambulatory Visit (HOSPITAL_COMMUNITY): Payer: Self-pay

## 2021-07-20 NOTE — Telephone Encounter (Signed)
Pharmacy Transitions of Care Follow-up Telephone Call  Date of discharge: 07/12/21  Discharge Diagnosis: Aflutter  How have you been since you were released from the hospital? Patient doing well since discharge, no questions about med at this time.   Medication changes made at discharge:  - START: Eliquis  Medication changes verified by the patient? Yes    Medication Accessibility:  Home Pharmacy: [facility]   Was the patient provided with refills on discharged medications? Yes   Have all prescriptions been transferred from [facility] to home pharmacy? Yes   Is the patient able to afford medications? Has insurance Notable copays: Eliquis $40/month  Medication Review:   APIXABAN (ELIQUIS)  Apixaban 2.5 mg BID initiated on 07/12/21.   - Discussed importance of taking medication around the same time everyday  - Reviewed potential DDIs with patient  - Advised patient of medications to avoid (NSAIDs, ASA)  - Educated that Tylenol (acetaminophen) will be the preferred analgesic to prevent risk of bleeding  - Emphasized importance of monitoring for signs and symptoms of bleeding (abnormal bruising, prolonged bleeding, nose bleeds, bleeding from gums, discolored urine, black tarry stools)  - Advised patient to alert all providers of anticoagulation therapy prior to starting a new medication or having a procedure   Follow-up Appointments:  [facility] f/u appt confirmed? Scheduled to see Dr. Agustin Cree on 07/31/21 @ 2:20pm.   If their condition worsens, is the pt aware to call PCP or go to the Emergency Dept.? Yes  Final Patient Assessment: Patient has f/u scheduled and refills at home pharmacy

## 2021-07-26 ENCOUNTER — Other Ambulatory Visit: Payer: Self-pay

## 2021-07-31 ENCOUNTER — Encounter: Payer: Self-pay | Admitting: Cardiology

## 2021-07-31 ENCOUNTER — Other Ambulatory Visit: Payer: Self-pay

## 2021-07-31 ENCOUNTER — Ambulatory Visit: Payer: Medicare PPO | Admitting: Cardiology

## 2021-07-31 VITALS — BP 124/80 | HR 73 | Ht 65.0 in | Wt 115.6 lb

## 2021-07-31 DIAGNOSIS — I420 Dilated cardiomyopathy: Secondary | ICD-10-CM | POA: Diagnosis not present

## 2021-07-31 DIAGNOSIS — I10 Essential (primary) hypertension: Secondary | ICD-10-CM | POA: Diagnosis not present

## 2021-07-31 DIAGNOSIS — I480 Paroxysmal atrial fibrillation: Secondary | ICD-10-CM | POA: Diagnosis not present

## 2021-07-31 MED ORDER — FUROSEMIDE 20 MG PO TABS: 20.0000 mg | ORAL_TABLET | Freq: Every day | ORAL | 3 refills | Status: DC

## 2021-07-31 NOTE — Patient Instructions (Signed)
Medication Instructions:  Your physician has recommended you make the following change in your medication:   START: Lasix 20 mg daily  *If you need a refill on your cardiac medications before your next appointment, please call your pharmacy*   Lab Work: Your physician recommends that you return for lab work in:   Labs today: BMP  If you have labs (blood work) drawn today and your tests are completely normal, you will receive your results only by: [place] (if you have [place]) OR A paper copy in the mail If you have any lab test that is abnormal or we need to change your treatment, we will call you to review the results.   Testing/Procedures: None   Follow-Up: At [facility], you and your health needs are our priority.  As part of our continuing mission to provide you with exceptional heart care, we have created designated Provider Care Teams.  These Care Teams include your primary Cardiologist (physician) and Advanced Practice Providers (APPs -  Physician Assistants and Nurse Practitioners) who all work together to provide you with the care you need, when you need it.  We recommend signing up for the patient portal called "MyChart".  Sign up information is provided on this After Visit Summary.  MyChart is used to connect with patients for Virtual Visits (Telemedicine).  Patients are able to view lab/test results, encounter notes, upcoming appointments, etc.  Non-urgent messages can be sent to your provider as well.   To learn more about what you can do with MyChart, go to [email or web address].    Your next appointment:   1 month(s)  The format for your next appointment:   In Person  Provider:   Jenne Campus, MD    Other Instructions None

## 2021-07-31 NOTE — Progress Notes (Signed)
Cardiology Office Note:    Date:  07/31/2021   ID:  [name], DOB [birth year], MRN [id number]  PCP:  Cassandra Flow, MD  Cardiologist:  Cassandra Campus, MD    Referring MD: Cassandra Flow, MD   Chief Complaint  Patient presents with   Multiple concerns     Dropped 10 lbs- might need to check potassium levels     History of Present Illness:    [facility] is a 88 y.o. female     with a history of paroxysmal atrial fibrillation, tachy-brady syndrome s/p Medtronic PPM in 2010, atrial myxoma s/p resection in 2010 hypertension, and hyperlipidemia who was transferred from [facility] on 07/09/2021 for further management of atrial flutter with RVR and acute CHF.   Patient was recently seen by me on 06/2021 with dyspnea on exertion and weight gain. She was noted to be tachycardic at that visit and EKG showed atrial flutter with rates in the 120s.  She was started on Lasix and IV potassium supplement.  Echo on 06/27/2021 showed LVEF of 30-35%, mildly enlarged RV and moderately reduced RV systolic function, mild MR, and mild to moderate TR.     She then presented to to [facility] on 07/06/2021 for evaluation of worsening palpitations and shortness of breath. Troponin I negative. Pro BNP elevated at 1,650. CTA negative for PE  and any coronary calcification but did show a large left pleural effusion. WBC 4.9, Hgb 12.2, Plts 163. Na 136, K 3.2, Cassandra 0.70. She was started on IV Lasix, IV Cardizem, and IV Heparin. She developed hypotension with this and was reportedly started on Dobutamine intermittently. She also underwent a thoracentesis with removal of 400 mL of fluid. Decision was ultimately made to transfer her to [facility] for TEE/DCCV. Eventually she ended up having cardioversion done.  Then gradually we tried to put her on appropriate medications however difficulties blood pressure being low. She comes today 2 months of follow-up overall she is feeling dramatically better.  She said  her energy level is u she lost 10 pounds and is very concerned about it.  Denies have any chest pain tightness squeezing pressure burning chest she does have a little bit dizziness but it is much better after reduction of her Lasix.  Past Medical History:  Diagnosis Date   AICD (automatic cardioverter/defibrillator) present 2010   ST JUDE   Atrial fibrillation ([place]) 01/27/2009   Centricity Description: BRADYCARDIA Qualifier: Diagnosis of  By: Cassandra [facility]   Centricity Description: ATRIAL ARRHYTHMIAS Qualifier: Diagnosis of  By: Cassandra [facility]     Atrial myxoma    s/p resection   ATRIAL MYXOMA 01/27/2009   Qualifier: Diagnosis of  By: Cassandra [facility]     Chronic kidney disease    kidney tumor   Congestive heart failure ([place]) 06/27/2021   Essential hypertension 07/20/2010   Qualifier: Diagnosis of  By: Cassandra Heman, MD, [facility]     Fibrocystic breast changes of both breasts 09/19/2015   GERD 01/27/2009   Qualifier: Diagnosis of  By: Cassandra [facility]     Hypertension    Neoplasm of uncertain behavior of skin 04/15/2018   Neurofibroma of back 03/10/2019   OSTEOPENIA 01/27/2009   Qualifier: Diagnosis of  By: Cassandra [facility]     Paroxysmal atrial fibrillation [facility])    Renal benign neoplasm, right 09/28/2020   Right renal mass    Seasonal allergies 09/10/2020   nonproductive cough   SICK SINUS/ TACHY-BRADY SYNDROME 01/27/2009   Qualifier: Diagnosis  of  By: [facility]     Unspecified open wound, right lower leg, sequela 04/16/2016   URI (upper respiratory infection) 09/07/2020   WITH COLD AND COUGH , TOOK 5 DAYS OF ANTIBIOTICS ALL ISSUES RESOLVED PER PT   Verrucas 06/13/2020   Wears glasses    Wears hearing aid in both ears     Past Surgical History:  Procedure Laterality Date   [place]  12/24/2008   Left -- [name]   BREAST SURGERY  1 ON RIGHT 2 ON LEFT   BIOPSY LAST DONE 1973, AND X 2 1960'S   CARDIOVERSION N/A 07/11/2021   Procedure:  CARDIOVERSION;  Surgeon: Cassandra Latch, MD;  Location: [place];  Service: Cardiovascular;  Laterality: N/A;   CHOLECYSTECTOMY  2010   LAPAPROSCIPIC   CYSTOSCOPY W/ URETERAL STENT PLACEMENT Right 09/27/2020   Procedure: CYSTOSCOPY WITH RETROGRADE PYELOGRAM/URETERAL STENT PLACEMENT;  Surgeon: Cassandra Mons, MD;  Location: [facility];  Service: Urology;  Laterality: Right;   IR RADIOLOGIST EVAL & MGMT  08/03/2020   IR RADIOLOGIST EVAL & MGMT  03/07/2021   PACEMAKER INSERTION  02/02/2009   PARTIAL HYSTERECTOMY  1973   STILL HAS OVARIES   RADIOLOGY WITH ANESTHESIA Right 09/28/2020   Procedure: IR WITH ANESTHESIA CT CRYOABLATION;  Surgeon: Criselda Peaches, MD;  Location: [facility];  Service: Anesthesiology;  Laterality: Right;   stapendectomy Bilateral 1970's   TEE WITHOUT CARDIOVERSION N/A 07/11/2021   Procedure: TRANSESOPHAGEAL ECHOCARDIOGRAM (TEE);  Surgeon: Cassandra Latch, MD;  Location[facility];  Service: Cardiovascular;  Laterality: N/A;    Current Medications: Current Meds  Medication Sig   acetaminophen (TYLENOL) 500 MG tablet Take 500 mg by mouth every 6 (six) hours as needed for mild pain.   apixaban (ELIQUIS) 2.5 MG TABS tablet Take 1 tablet (2.5 mg total) by mouth 2 (two) times daily.   famotidine (PEPCID) 20 MG tablet Take 20 mg by mouth 2 (two) times daily as needed for heartburn or indigestion.   furosemide (LASIX) 40 MG tablet Take 1 tablet (40 mg total) by mouth daily. (Patient taking differently: Take 20 mg by mouth daily.)   losartan (COZAAR) 25 MG tablet Take 25 mg by mouth.   metoprolol succinate (TOPROL-XL) 25 MG 24 hr tablet Take 25 mg by mouth daily.   potassium chloride (KLOR-CON) 10 MEQ tablet Take 1 tablet (10 mEq total) by mouth daily.     Allergies:   Aspirin, Alendronate sodium, Amiodarone hcl, Bactrim [sulfamethoxazole-trimethoprim], Ibandronate sodium, Risedronate sodium, Statins, Vimovo [naproxen-esomeprazole mg],  Xarelto [rivaroxaban], Metaxalone, Mobic [meloxicam], and Toprol xl [metoprolol]   Social History   Socioeconomic History   Marital status: Married    Spouse name: Not on file   Number of children: Not on file   Years of education: Not on file   Highest education level: Not on file  Occupational History   Occupation: retired Pharmacist, [facility]  Tobacco Use   Smoking status: Former    Packs/day: 0.25    Years: 12.00    Pack years: 3.00    Types: Cigarettes    Quit date: 06/04/1988    Years since quitting: 33.1   Smokeless tobacco: Never  Vaping Use   Vaping Use: Never used  Substance and Sexual Activity   Alcohol use: Not Currently   Drug use: No   Sexual activity: Not on file  Other Topics Concern   Not on file  Social History Narrative  Lives in [place] Determinants of Health   Financial Resource Strain: Not on file  Food Insecurity: Not on file  Transportation Needs: Not on file  Physical Activity: Not on file  Stress: Not on file  Social Connections: Not on file     Family History: The patient's family history includes Coronary artery disease in an other family member; Dementia in her mother and another family member; Heart attack (age of onset: 44) in her father; Heart disease in her father; Pancreatic cancer in her mother and another family member; Thyroid disease in her mother; Transient ischemic attack in her mother. ROS:   Please see the history of present illness.    All 14 point review of systems negative except as described per history of present illness  EKGs/Labs/Other Studies Reviewed:      Recent Labs: 06/27/2021: NT-Pro BNP 1,361 07/09/2021: ALT 17; B Natriuretic Peptide 133.5; Hemoglobin 12.0; Magnesium 1.8; Platelets 186 07/14/2021: BUN 17; Creatinine, Ser 1.13; Potassium 4.2; Sodium 142  Recent Lipid Panel    Component Value Date/Time   CHOL  12/22/2008 0150    146        ATP III CLASSIFICATION:  <200     mg/dL   Desirable  200-239  mg/dL    Borderline High  >=240    mg/dL   High          TRIG 83 12/22/2008 0150   HDL 36 (L) 12/22/2008 0150   CHOLHDL 4.1 12/22/2008 0150   VLDL 17 12/22/2008 0150   LDLCALC  12/22/2008 0150    93        Total Cholesterol/HDL:CHD Risk Coronary Heart Disease Risk Table                     Men   Women  1/2 Average Risk   3.4   3.3  Average Risk       5.0   4.4  2 X Average Risk   9.6   7.1  3 X Average Risk  23.4   11.0        Use the calculated Patient Ratio above and the CHD Risk Table to determine the patient's CHD Risk.        ATP III CLASSIFICATION (LDL):  <100     mg/dL   Optimal  100-129  mg/dL   Near or Above                    Optimal  130-159  mg/dL   Borderline  160-189  mg/dL   High  >190     mg/dL   Very High    Physical Exam:    VS:  There were no vitals taken for this visit.    Wt Readings from Last 3 Encounters:  07/19/21 116 lb 6.4 oz (52.8 kg)  07/14/21 127 lb 4.8 oz (57.7 kg)  07/12/21 124 lb 5.4 oz (56.4 kg)     GEN:  Well nourished, well developed in no acute distress HEENT: Normal NECK: No JVD; No carotid bruits LYMPHATICS: No lymphadenopathy CARDIAC: RRR, no murmurs, no rubs, no gallops RESPIRATORY:  Clear to auscultation without rales, wheezing or rhonchi  ABDOMEN: Soft, non-tender, non-distended MUSCULOSKELETAL:  No edema; No deformity  SKIN: Warm and dry LOWER EXTREMITIES: no swelling NEUROLOGIC:  Alert and oriented x 3 PSYCHIATRIC:  Normal affect   ASSESSMENT:    1. Dilated cardiomyopathy (HCC) ejection fraction 25 to 30% in 2023   2.  Essential hypertension   3. Paroxysmal atrial fibrillation (HCC)    PLAN:    In order of problems listed above:  Dilated cardiomyopathy ejection fraction 25 to 30% gradually tried to put her on the right medication which is somewhat difficult because of low blood pressure as well as kidney dysfunction.  What I do today since she is hemodynamically compensated I will reduce dose of her furosemide to only 20  mg once daily, Chem-7 will be checked, if Chem-7 is fine we will increase dose of losartan to 25 twice daily.  2.  Essential hypertension blood pressure well controlled continue present management.   3.  Paroxysmal atrial fibrillation she is anticoagulant check continues to maintain sinus rhythm.   Medication Adjustments/Labs and Tests Ordered: Current medicines are reviewed at length with the patient today.  Concerns regarding medicines are outlined above.  No orders of the defined types were placed in this encounter.  Medication changes: No orders of the defined types were placed in this encounter.   Signed, Park Liter, MD, [facility] 07/31/2021 2:43 PM    [place]

## 2021-07-31 NOTE — Addendum Note (Signed)
Addended by: Edwyna Shell I on: 07/31/2021 03:11 PM   Modules accepted: Orders

## 2021-08-01 LAB — BASIC METABOLIC PANEL
BUN/Creatinine Ratio: 15 (ref 12–28)
BUN: 13 mg/dL (ref 8–27)
CO2: 25 mmol/L (ref 20–29)
Calcium: 10.1 mg/dL (ref 8.7–10.3)
Chloride: 100 mmol/L (ref 96–106)
Creatinine, Ser: 0.88 mg/dL (ref 0.57–1.00)
Glucose: 90 mg/dL (ref 70–99)
Potassium: 4.1 mmol/L (ref 3.5–5.2)
Sodium: 140 mmol/L (ref 134–144)
eGFR: 64 mL/min/{1.73_m2} (ref >59)

## 2021-08-03 ENCOUNTER — Telehealth: Payer: Self-pay

## 2021-08-03 NOTE — Telephone Encounter (Signed)
Patient notified of results.

## 2021-08-03 NOTE — Telephone Encounter (Signed)
-----   Message from Park Liter, MD sent at 08/03/2021 10:48 AM EST ----- ?Labs are looking good, continue present management ?

## 2021-08-10 ENCOUNTER — Telehealth: Payer: Self-pay | Admitting: Cardiology

## 2021-08-10 MED ORDER — METOPROLOL SUCCINATE ER 25 MG PO TB24: 25.0000 mg | ORAL_TABLET | Freq: Every day | ORAL | 3 refills | Status: DC

## 2021-08-10 NOTE — Telephone Encounter (Signed)
Pt c/o medication issue: ? ?1. Name of Medication: metoprolol succinate (TOPROL-XL) 25 MG 24 hr tablet ? ?2. How are you currently taking this medication (dosage and times per day)? As directed ? ?3. Are you having a reaction (difficulty breathing--STAT)? no ? ?4. What is your medication issue? [facility] called, pt was prescribed this medication at the hospital and wants to know if she should continue it. Please advise patient.  ? ? ?Pts phone number: [phone] ? ? ?  ?

## 2021-08-10 NOTE — Telephone Encounter (Signed)
Pt seen in office on 07/31/21 by Dr Agustin Cree who documented in his notes: ? ?2.  Essential hypertension blood pressure well controlled continue present management.  ? ?Will send refill into pharmacy on file. ?

## 2021-08-24 ENCOUNTER — Other Ambulatory Visit: Payer: Self-pay

## 2021-08-24 ENCOUNTER — Other Ambulatory Visit: Payer: Self-pay | Admitting: Interventional Radiology

## 2021-08-24 DIAGNOSIS — N2889 Other specified disorders of kidney and ureter: Secondary | ICD-10-CM

## 2021-08-28 ENCOUNTER — Other Ambulatory Visit: Payer: Self-pay

## 2021-08-28 DIAGNOSIS — N2889 Other specified disorders of kidney and ureter: Secondary | ICD-10-CM

## 2021-09-04 DIAGNOSIS — L02611 Cutaneous abscess of right foot: Secondary | ICD-10-CM | POA: Diagnosis not present

## 2021-09-05 ENCOUNTER — Ambulatory Visit: Payer: Medicare PPO | Admitting: Cardiology

## 2021-09-05 DIAGNOSIS — H25812 Combined forms of age-related cataract, left eye: Secondary | ICD-10-CM | POA: Diagnosis not present

## 2021-09-05 DIAGNOSIS — Z01818 Encounter for other preprocedural examination: Secondary | ICD-10-CM | POA: Diagnosis not present

## 2021-09-11 ENCOUNTER — Encounter: Payer: Self-pay | Admitting: Cardiology

## 2021-09-11 ENCOUNTER — Ambulatory Visit: Payer: Medicare PPO | Admitting: Cardiology

## 2021-09-11 VITALS — BP 118/76 | HR 72 | Ht 65.0 in | Wt 119.6 lb

## 2021-09-11 DIAGNOSIS — I480 Paroxysmal atrial fibrillation: Secondary | ICD-10-CM

## 2021-09-11 DIAGNOSIS — I10 Essential (primary) hypertension: Secondary | ICD-10-CM

## 2021-09-11 DIAGNOSIS — I420 Dilated cardiomyopathy: Secondary | ICD-10-CM | POA: Diagnosis not present

## 2021-09-11 MED ORDER — LOSARTAN POTASSIUM 25 MG PO TABS: 25.0000 mg | ORAL_TABLET | Freq: Two times a day (BID) | ORAL | 3 refills | Status: DC

## 2021-09-11 NOTE — Patient Instructions (Signed)
Medication Instructions:  ?Your physician has recommended you make the following change in your medication: START taking Losartan '25mg'$  1 tablet twice daily  ?*If you need a refill on your cardiac medications before your next appointment, please call your pharmacy* ? ? ?Lab Work: ?Your physician recommends that you return for lab work in: Next week- BMP  ?If you have labs (blood work) drawn today and your tests are completely normal, you will receive your results only by: ?MyChart Message (if you have MyChart) OR ?A paper copy in the mail ?If you have any lab test that is abnormal or we need to change your treatment, we will call you to review the results. ? ? ?Testing/Procedures: ?None ordered ? ? ?Follow-Up: ?At [facility], you and your health needs are our priority.  As part of our continuing mission to provide you with exceptional heart care, we have created designated Provider Care Teams.  These Care Teams include your primary Cardiologist (physician) and Advanced Practice Providers (APPs -  Physician Assistants and Nurse Practitioners) who all work together to provide you with the care you need, when you need it. ? ?We recommend signing up for the patient portal called "MyChart".  Sign up information is provided on this After Visit Summary.  MyChart is used to connect with patients for Virtual Visits (Telemedicine).  Patients are able to view lab/test results, encounter notes, upcoming appointments, etc.  Non-urgent messages can be sent to your provider as well.   ?To learn more about what you can do with MyChart, go to [email or web address].   ? ?Your next appointment:   ?6 week(s) ? ?The format for your next appointment:   ?In Person ? ?Provider:   ?Jenne Campus, MD  ? ? ?Other Instructions ?None ? ?Important Information About Sugar ? ? ? ? ?  ? ?

## 2021-09-11 NOTE — Progress Notes (Signed)
?Cardiology Office Note:   ? ?Date:  09/11/2021  ? ?ID:  Cassandra [facility], DOB 07-01-1932, MRN [id number] ? ?PCP:  Mateo Flow, MD  ?Cardiologist:  Jenne Campus, MD   ? ?Referring MD: Mateo Flow, MD  ? ?Chief Complaint  ?Patient presents with  ? Medication Management  ?I am doing very well ? ?History of Present Illness:   ? ?[facility] is a 88 y.o. female    with a history of paroxysmal atrial fibrillation, tachy-brady syndrome s/p Medtronic PPM in 2010, atrial myxoma s/p resection in 2010 hypertension, and hyperlipidemia who was transferred from [facility] on 07/09/2021 for further management of atrial flutter with RVR and acute CHF. ?  ?Patient was recently seen by me on 06/2021 with dyspnea on exertion and weight gain. She was noted to be tachycardic at that visit and EKG showed atrial flutter with rates in the 120s.  She was started on Lasix and IV potassium supplement.  Echo on 06/27/2021 showed LVEF of 30-35%, mildly enlarged RV and moderately reduced RV systolic function, mild MR, and mild to moderate TR.   ?  ?She then presented to to [facility] on 07/06/2021 for evaluation of worsening palpitations and shortness of breath. Troponin I negative. Pro BNP elevated at 1,650. CTA negative for PE  and any coronary calcification but did show a large left pleural effusion. WBC 4.9, Hgb 12.2, Plts 163. Na 136, K 3.2, Cr 0.70. She was started on IV Lasix, IV Cardizem, and IV Heparin. She developed hypotension with this and was reportedly started on Dobutamine intermittently. She also underwent a thoracentesis with removal of 400 mL of fluid. Decision was ultimately made to transfer her to [facility] for TEE/DCCV. ?Eventually she ended up having cardioversion done.  Then gradually we tried to put her on appropriate medications however difficulties blood pressure being low. ?She comes today to my office for follow-up overall she is doing very well.  She denies have any chest pain tightness squeezing  pressure burning chest symptoms at evening time she said when she wakes up in the morning she will have palpitations ? ?Past Medical History:  ?Diagnosis Date  ? AICD (automatic cardioverter/defibrillator) present 2010  ? ST JUDE  ? Atrial fibrillation ([place]) 01/27/2009  ? Centricity Description: BRADYCARDIA Qualifier: Diagnosis of  By: Darrick Meigs   Centricity Description: ATRIAL ARRHYTHMIAS Qualifier: Diagnosis of  By: Darrick Meigs    ? Atrial myxoma   ? s/p resection  ? ATRIAL MYXOMA 01/27/2009  ? Qualifier: Diagnosis of  By: Darrick Meigs    ? Chronic kidney disease   ? kidney tumor  ? Congestive heart failure ([place]) 06/27/2021  ? Essential hypertension 07/20/2010  ? Qualifier: Diagnosis of  By: Rayann Heman, MD, Jeneen Rinks    ? Fibrocystic breast changes of both breasts 09/19/2015  ? GERD 01/27/2009  ? Qualifier: Diagnosis of  By: Darrick Meigs    ? Hypertension   ? Neoplasm of uncertain behavior of skin 04/15/2018  ? Neurofibroma of back 03/10/2019  ? OSTEOPENIA 01/27/2009  ? Qualifier: Diagnosis of  By: Darrick Meigs    ? Paroxysmal atrial fibrillation (HCC)   ? Renal benign neoplasm, right 09/28/2020  ? Right renal mass   ? Seasonal allergies 09/10/2020  ? nonproductive cough  ? SICK SINUS/ TACHY-BRADY SYNDROME 01/27/2009  ? Qualifier: Diagnosis of  By: Darrick Meigs    ? Unspecified open wound, right lower leg, sequela 04/16/2016  ? URI (upper respiratory infection) 09/07/2020  ? WITH  COLD AND COUGH , TOOK 5 DAYS OF ANTIBIOTICS ALL ISSUES RESOLVED PER PT  ? Ver[place] 06/13/2020  ? Wears glasses   ? Wears hearing aid in both ears   ? ? ?Past Surgical History:  ?Procedure Laterality Date  ? APPENDECTOMY  1954  ? OPEN  ? ATRIAL MYXOMA EXCISION  12/24/2008  ? Left -- Dr Roxy Manns  ? BREAST SURGERY  1 ON RIGHT 2 ON LEFT  ? BIOPSY LAST DONE 1973, AND X 2 1960'S  ? CARDIOVERSION N/A 07/11/2021  ? Procedure: CARDIOVERSION;  Surgeon: Skeet Latch, MD;  Location: [facility];  Service: Cardiovascular;  Laterality: N/A;  ?  CHOLECYSTECTOMY  2010  ? [place]  ? CYSTOSCOPY W/ URETERAL STENT PLACEMENT Right 09/27/2020  ? Procedure: CYSTOSCOPY WITH RETROGRADE PYELOGRAM/URETERAL STENT PLACEMENT;  Surgeon: Ceasar Mons, MD;  Location: [facility];  Service: Urology;  Laterality: Right;  ? IR RADIOLOGIST EVAL & MGMT  08/03/2020  ? IR RADIOLOGIST EVAL & MGMT  03/07/2021  ? PACEMAKER INSERTION  02/02/2009  ? PARTIAL HYSTERECTOMY  1973  ? STILL HAS OVARIES  ? RADIOLOGY WITH ANESTHESIA Right 09/28/2020  ? Procedure: IR WITH ANESTHESIA CT CRYOABLATION;  Surgeon: Criselda Peaches, MD;  Location: [facility];  Service: Anesthesiology;  Laterality: Right;  ? stapendectomy Bilateral 1970's  ? TEE WITHOUT CARDIOVERSION N/A 07/11/2021  ? Procedure: TRANSESOPHAGEAL ECHOCARDIOGRAM (TEE);  Surgeon: Skeet Latch, MD;  Location: [place];  Service: Cardiovascular;  Laterality: N/A;  ? ? ?Current Medications: ?Current Meds  ?Medication Sig  ? acetaminophen (TYLENOL) 500 MG tablet Take 500 mg by mouth every 6 (six) hours as needed for mild pain.  ? apixaban (ELIQUIS) 2.5 MG TABS tablet Take 1 tablet (2.5 mg total) by mouth 2 (two) times daily.  ? famotidine (PEPCID) 20 MG tablet Take 20 mg by mouth 2 (two) times daily as needed for heartburn or indigestion.  ? furosemide (LASIX) 20 MG tablet Take 1 tablet (20 mg total) by mouth daily.  ? losartan (COZAAR) 25 MG tablet Take 25 mg by mouth daily.  ? metoprolol succinate (TOPROL-XL) 25 MG 24 hr tablet Take 1 tablet (25 mg total) by mouth daily.  ? ofloxacin (OCUFLOX) 0.3 % ophthalmic solution Place 1 drop into the left eye in the morning, at noon, in the evening, and at bedtime.  ? potassium chloride (KLOR-CON) 10 MEQ tablet Take 1 tablet (10 mEq total) by mouth daily.  ? prednisoLONE acetate (PRED FORTE) 1 % ophthalmic suspension Place 1 drop into the left eye daily.  ? sulfamethoxazole-trimethoprim (BACTRIM DS) 800-160 MG tablet Take 1 tablet by mouth 2 (two) times daily.   ?  ? ?Allergies:   Aspirin, Alendronate sodium, Amiodarone hcl, Bactrim [sulfamethoxazole-trimethoprim], Ibandronate sodium, Risedronate sodium, Statins, Vimovo [naproxen-esomeprazole mg], Xarelto [rivaroxaban], Metaxalone, Mobic [meloxicam], and Toprol xl [metoprolol]  ? ?Social History  ? ?Socioeconomic History  ? Marital status: Married  ?  Spouse name: Not on file  ? Number of children: Not on file  ? Years of education: Not on file  ? Highest education level: Not on file  ?Occupational History  ? Occupation: retired Pharmacist, hospital  ?Tobacco Use  ? Smoking status: Former  ?  Packs/day: 0.25  ?  Years: 12.00  ?  Pack years: 3.00  ?  Types: Cigarettes  ?  Quit date: 06/04/1988  ?  Years since quitting: 33.2  ? Smokeless tobacco: Never  ?Vaping Use  ? Vaping Use: Never used  ?Substance and Sexual Activity  ? Alcohol  use: Not Currently  ? Drug use: No  ? Sexual activity: Not on file  ?Other Topics Concern  ? Not on file  ?Social History Narrative  ? Lives in [place]  ? ?Social Determinants of Health  ? ?Financial Resource Strain: Not on file  ?Food Insecurity: Not on file  ?Transportation Needs: Not on file  ?Physical Activity: Not on file  ?Stress: Not on file  ?Social Connections: Not on file  ?  ? ?Family History: ?The patient's family history includes Coronary artery disease in an other family member; Dementia in her mother and another family member; Heart attack (age of onset: 55) in her father; Heart disease in her father; Pancreatic cancer in her mother and another family member; Thyroid disease in her mother; Transient ischemic attack in her mother. ?ROS:   ?Please see the history of present illness.    ?All 14 point review of systems negative except as described per history of present illness ? ?EKGs/Labs/Other Studies Reviewed:   ? ? ? ?Recent Labs: ?06/27/2021: NT-Pro BNP 1,361 ?07/09/2021: ALT 17; B Natriuretic Peptide 133.5; Hemoglobin 12.0; Magnesium 1.8; Platelets 186 ?07/31/2021: BUN 13; Creatinine, Ser 0.88;  Potassium 4.1; Sodium 140  ?Recent Lipid Panel ?   ?Component Value Date/Time  ? CHOL  12/22/2008 0150  ?  146        ?ATP III CLASSIFICATION: ? <200     mg/dL   Desirable ? 200-239  mg/dL   Borderline [place]

## 2021-09-12 DIAGNOSIS — Z7901 Long term (current) use of anticoagulants: Secondary | ICD-10-CM | POA: Diagnosis not present

## 2021-09-12 DIAGNOSIS — H25812 Combined forms of age-related cataract, left eye: Secondary | ICD-10-CM | POA: Diagnosis not present

## 2021-09-12 DIAGNOSIS — J449 Chronic obstructive pulmonary disease, unspecified: Secondary | ICD-10-CM | POA: Diagnosis not present

## 2021-09-12 DIAGNOSIS — H259 Unspecified age-related cataract: Secondary | ICD-10-CM | POA: Diagnosis not present

## 2021-09-12 DIAGNOSIS — I4891 Unspecified atrial fibrillation: Secondary | ICD-10-CM | POA: Diagnosis not present

## 2021-09-12 DIAGNOSIS — Z87891 Personal history of nicotine dependence: Secondary | ICD-10-CM | POA: Diagnosis not present

## 2021-09-12 DIAGNOSIS — Z980 Intestinal bypass and anastomosis status: Secondary | ICD-10-CM | POA: Diagnosis not present

## 2021-09-12 DIAGNOSIS — I10 Essential (primary) hypertension: Secondary | ICD-10-CM | POA: Diagnosis not present

## 2021-09-19 DIAGNOSIS — I10 Essential (primary) hypertension: Secondary | ICD-10-CM | POA: Diagnosis not present

## 2021-09-20 LAB — BASIC METABOLIC PANEL
BUN/Creatinine Ratio: 22 (ref 12–28)
BUN: 17 mg/dL (ref 8–27)
CO2: 30 mmol/L — ABNORMAL HIGH (ref 20–29)
Calcium: 9.9 mg/dL (ref 8.7–10.3)
Chloride: 102 mmol/L (ref 96–106)
Creatinine, Ser: 0.76 mg/dL (ref 0.57–1.00)
Glucose: 88 mg/dL (ref 70–99)
Potassium: 4.2 mmol/L (ref 3.5–5.2)
Sodium: 142 mmol/L (ref 134–144)
eGFR: 76 mL/min/{1.73_m2} (ref >59)

## 2021-09-25 DIAGNOSIS — K449 Diaphragmatic hernia without obstruction or gangrene: Secondary | ICD-10-CM | POA: Diagnosis not present

## 2021-09-25 DIAGNOSIS — I700 Atherosclerosis of aorta: Secondary | ICD-10-CM | POA: Diagnosis not present

## 2021-09-25 DIAGNOSIS — M810 Age-related osteoporosis without current pathological fracture: Secondary | ICD-10-CM | POA: Diagnosis not present

## 2021-09-25 DIAGNOSIS — N2889 Other specified disorders of kidney and ureter: Secondary | ICD-10-CM | POA: Diagnosis not present

## 2021-10-04 ENCOUNTER — Ambulatory Visit: Payer: Medicare PPO

## 2021-10-09 ENCOUNTER — Encounter: Payer: Self-pay | Admitting: *Deleted

## 2021-10-09 ENCOUNTER — Ambulatory Visit
Admission: RE | Admit: 2021-10-09 | Discharge: 2021-10-09 | Disposition: A | Discharge status: Home / Self Care | Payer: Medicare PPO | Source: Ambulatory Visit | Attending: Interventional Radiology | Admitting: Interventional Radiology

## 2021-10-09 DIAGNOSIS — N2889 Other specified disorders of kidney and ureter: Secondary | ICD-10-CM | POA: Diagnosis not present

## 2021-10-09 DIAGNOSIS — Z9889 Other specified postprocedural states: Secondary | ICD-10-CM | POA: Diagnosis not present

## 2021-10-09 HISTORY — PX: IR RADIOLOGIST EVAL & MGMT: IMG5224

## 2021-10-09 NOTE — Progress Notes (Signed)
? ? ?Chief Complaint: ?Patient was consulted remotely today (TeleHealth) for  right renal mass  at the request of Cassandra Royster K.   ? ?Referring Physician(s): ?Dr. Gerald Stabs Lovena Romero ? ?History of Present Illness: ?Cassandra Romero is a 88 y.o. female who was recently found to have a 1.7 x 1.4 x 1.5 cm solid enhancing mass in the posterior aspect of the lower pole of the right kidney concerning for renal cell carcinoma.  Her most recent imaging was on 06/28/2020.  Cassandra Romero is well-known to me.  I have been taking care of her husband for over a year for a chronic right-sided distal ureteral stricture and associated nephroureteral tube.  Cassandra Romero is his primary caregiver.  Given the location of her lesion (it extends centrally and abuts a lower pole calyx) relatively advanced age and her status is a primary caregiver, Dr. Lovena Romero feels that she is a relatively high risk surgical candidate.  I definitely agree with that assessment. ?  ?She underwent percutaneous thermal ablation on 09/28/2020.  Her postprocedural course was not complicated and she recovered fully.  I saw her in follow-up at [facility].  Biopsy was not performed at the time of ablation due to small lesion size. ? ?Follow-up renal protocol CT evaluation performed 03/06/2021 demonstrates an expected post ablation zone defect with no findings to suggest residual or recurrent viable tumor. ? ?CT 09/25/21 - Continued evolution of the right interpolar cryoablation defect without findings of local disease recurrence or abdominal metastatic ?disease.  ?  ?Overall, she is doing quite well.  Cassandra Romero is back in the hospital again, unfortunately.  ? ? ?Past Medical History:  ?Diagnosis Date  ? AICD (automatic cardioverter/defibrillator) present 2010  ? [place]  ? Atrial fibrillation ([place]) 01/27/2009  ? Centricity Description: BRADYCARDIA Qualifier: Diagnosis of  By: Darrick Meigs   Centricity Description: ATRIAL ARRHYTHMIAS Qualifier: Diagnosis of  By:  Darrick Meigs    ? Atrial myxoma   ? s/p resection  ? ATRIAL MYXOMA 01/27/2009  ? Qualifier: Diagnosis of  By: Darrick Meigs    ? Chronic kidney disease   ? kidney tumor  ? Congestive heart failure ([place]) 06/27/2021  ? Essential hypertension 07/20/2010  ? Qualifier: Diagnosis of  By: Rayann Heman, MD, Jeneen Rinks    ? Fibrocystic breast changes of both breasts 09/19/2015  ? GERD 01/27/2009  ? Qualifier: Diagnosis of  By: Darrick Meigs    ? Hypertension   ? Neoplasm of uncertain behavior of skin 04/15/2018  ? Neurofibroma of back 03/10/2019  ? OSTEOPENIA 01/27/2009  ? Qualifier: Diagnosis of  By: Darrick Meigs    ? Paroxysmal atrial fibrillation (HCC)   ? Renal benign neoplasm, right 09/28/2020  ? Right renal mass   ? Seasonal allergies 09/10/2020  ? nonproductive cough  ? SICK SINUS/ TACHY-BRADY SYNDROME 01/27/2009  ? Qualifier: Diagnosis of  By: Darrick Meigs    ? Unspecified open wound, right lower leg, sequela 04/16/2016  ? URI (upper respiratory infection) 09/07/2020  ? WITH COLD AND COUGH , TOOK 5 DAYS OF ANTIBIOTICS ALL ISSUES RESOLVED PER PT  ? Verrucas 06/13/2020  ? Wears glasses   ? Wears hearing aid in both ears   ? ? ?Past Surgical History:  ?Procedure Laterality Date  ? APPENDECTOMY  1954  ? OPEN  ? ATRIAL MYXOMA EXCISION  12/24/2008  ? Left -- Dr Roxy Manns  ? BREAST SURGERY  1 ON RIGHT 2 ON LEFT  ? BIOPSY LAST DONE 1973, AND X 2 1960'S  ?  CARDIOVERSION N/A 07/11/2021  ? Procedure: CARDIOVERSION;  Surgeon: Skeet Latch, MD;  Location[facility] ENDOSCOPY;  Service: Cardiovascular;  Laterality: N/A;  ? CHOLECYSTECTOMY  2010  ? [place]  ? CYSTOSCOPY W/ URETERAL STENT PLACEMENT Right 09/27/2020  ? Procedure: CYSTOSCOPY WITH RETROGRADE PYELOGRAM/URETERAL STENT PLACEMENT;  Surgeon: Ceasar Mons, MD;  Location: [facility];  Service: Urology;  Laterality: Right;  ? IR RADIOLOGIST EVAL & MGMT  08/03/2020  ? IR RADIOLOGIST EVAL & MGMT  03/07/2021  ? PACEMAKER INSERTION  02/02/2009  ? PARTIAL  HYSTERECTOMY  1973  ? STILL HAS OVARIES  ? RADIOLOGY WITH ANESTHESIA Right 09/28/2020  ? Procedure: IR WITH ANESTHESIA CT CRYOABLATION;  Surgeon: Criselda Peaches, MD;  Location: [facility];  Service: Anesthesiology;  Laterality: Right;  ? stapendectomy Bilateral 1970's  ? TEE WITHOUT CARDIOVERSION N/A 07/11/2021  ? Procedure: TRANSESOPHAGEAL ECHOCARDIOGRAM (TEE);  Surgeon: Skeet Latch, MD;  Location: [place];  Service: Cardiovascular;  Laterality: N/A;  ? ? ?Allergies: ?Aspirin, Alendronate sodium, Amiodarone hcl, Bactrim [sulfamethoxazole-trimethoprim], Ibandronate sodium, Risedronate sodium, Statins, Vimovo [naproxen-esomeprazole mg], Xarelto [rivaroxaban], Metaxalone, Mobic [meloxicam], and Toprol xl [metoprolol] ? ?Medications: ?Prior to Admission medications   ?Medication Sig Start Date End Date Taking? Authorizing Provider  ?acetaminophen (TYLENOL) 500 MG tablet Take 500 mg by mouth every 6 (six) hours as needed for mild pain.    [provider]  ?apixaban (ELIQUIS) 2.5 MG TABS tablet Take 1 tablet (2.5 mg total) by mouth 2 (two) times daily. 07/12/21   Darreld Mclean, PA-C  ?famotidine (PEPCID) 20 MG tablet Take 20 mg by mouth 2 (two) times daily as needed for heartburn or indigestion.    [provider]  ?furosemide (LASIX) 20 MG tablet Take 1 tablet (20 mg total) by mouth daily. 07/31/21   Park Liter, MD  ?losartan (COZAAR) 25 MG tablet Take 1 tablet (25 mg total) by mouth 2 (two) times daily. 09/11/21 03/10/22  Park Liter, MD  ?metoprolol succinate (TOPROL-XL) 25 MG 24 hr tablet Take 1 tablet (25 mg total) by mouth daily. 08/10/21   Park Liter, MD  ?ofloxacin (OCUFLOX) 0.3 % ophthalmic solution Place 1 drop into the left eye in the morning, at noon, in the evening, and at bedtime. 09/05/21   [provider]  ?potassium chloride (KLOR-CON) 10 MEQ tablet Take 1 tablet (10 mEq total) by mouth daily. 06/27/21   Park Liter, MD  ?prednisoLONE  acetate (PRED FORTE) 1 % ophthalmic suspension Place 1 drop into the left eye daily. 09/05/21   [provider]  ?sulfamethoxazole-trimethoprim (BACTRIM DS) 800-160 MG tablet Take 1 tablet by mouth 2 (two) times daily. 09/04/21   [provider]  ?  ? ?Family History  ?Problem Relation Age of Onset  ? Pancreatic cancer Mother   ? Transient ischemic attack Mother   ?     numerous  ? Dementia Mother   ? Thyroid disease Mother   ? Heart disease Father   ? Heart attack Father 9  ? Pancreatic cancer Other   ? Dementia Other   ?     parathyroid removal  ? Coronary artery disease Other   ? ? ?Social History  ? ?Socioeconomic History  ? Marital status: Married  ?  Spouse name: Not on file  ? Number of children: Not on file  ? Years of education: Not on file  ? Highest education level: Not on file  ?Occupational History  ? Occupation: retired Pharmacist, hospital  ?Tobacco Use  ?  Smoking status: Former  ?  Packs/day: 0.25  ?  Years: 12.00  ?  Pack years: 3.00  ?  Types: Cigarettes  ?  Quit date: 06/04/1988  ?  Years since quitting: 33.3  ? Smokeless tobacco: Never  ?Vaping Use  ? Vaping Use: Never used  ?Substance and Sexual Activity  ? Alcohol use: Not Currently  ? Drug use: No  ? Sexual activity: Not on file  ?Other Topics Concern  ? Not on file  ?Social History Narrative  ? Lives in [place]  ? ?Social Determinants of Health  ? ?Financial Resource Strain: Not on file  ?Food Insecurity: Not on file  ?Transportation Needs: Not on file  ?Physical Activity: Not on file  ?Stress: Not on file  ?Social Connections: Not on file  ? ? ?ECOG Status: ?0 - Asymptomatic ? ?Review of Systems ? ?Review of Systems: A 12 point ROS discussed and pertinent positives are indicated in the HPI above.  All other systems are negative. ? ?Physical Exam ?No direct physical exam was performed (except for noted visual exam findings with Video Visits).  ?  ?Vital Signs: ?There were no vitals taken for this visit. ? ?Imaging: ?No results  found. ? ?Labs: ? ?CBC: ?Recent Labs  ?  06/27/21 ?1614 07/09/21 ?1751  ?WBC 5.0 4.5  ?HGB 12.5 12.0  ?HCT 38.9 40.3  ?PLT 260 186  ? ? ?COAGS: ?Recent Labs  ?  07/10/21 ?2248  ?INR 1.4*  ? ? ?BMP: ?Recent Labs  ?  07/09/21

## 2021-10-14 DIAGNOSIS — I951 Orthostatic hypotension: Secondary | ICD-10-CM | POA: Diagnosis not present

## 2021-10-23 ENCOUNTER — Encounter: Payer: Self-pay | Admitting: Cardiology

## 2021-10-23 ENCOUNTER — Ambulatory Visit: Payer: Medicare PPO | Admitting: Cardiology

## 2021-10-23 VITALS — BP 132/84 | HR 75 | Ht 65.0 in | Wt 123.8 lb

## 2021-10-23 DIAGNOSIS — I480 Paroxysmal atrial fibrillation: Secondary | ICD-10-CM

## 2021-10-23 DIAGNOSIS — I495 Sick sinus syndrome: Secondary | ICD-10-CM | POA: Diagnosis not present

## 2021-10-23 DIAGNOSIS — I420 Dilated cardiomyopathy: Secondary | ICD-10-CM

## 2021-10-23 NOTE — Progress Notes (Signed)
Cardiology Office Note:    Date:  10/23/2021   ID:  Cassandra Romero, DOB [birth year], MRN [id number]  PCP:  Mateo Flow, MD  Cardiologist:  Jenne Campus, MD    Referring MD: Mateo Flow, MD   Chief Complaint  Patient presents with   follow up hospital visit    History of Present Illness:    Cassandra Romero is a 88 y.o. female    with a history of paroxysmal atrial fibrillation, tachy-brady syndrome s/p [employer] PPM in 2010, atrial myxoma s/p resection in 2010 hypertension, and hyperlipidemia who was transferred from [facility] on 07/09/2021 for further management of atrial flutter with RVR and acute CHF.   Patient was recently seen by me on 06/2021 with dyspnea on exertion and weight gain. She was noted to be tachycardic at that visit and EKG showed atrial flutter with rates in the 120s.  She was started on Lasix and IV potassium supplement.  Echo on 06/27/2021 showed LVEF of 30-35%, mildly enlarged RV and moderately reduced RV systolic function, mild MR, and mild to moderate TR.     She then presented to to [facility] on 07/06/2021 for evaluation of worsening palpitations and shortness of breath. Troponin I negative. Pro BNP elevated at 1,650. CTA negative for PE  and any coronary calcification but did show a large left pleural effusion. WBC 4.9, Hgb 12.2, Plts 163. Na 136, K 3.2, Cr 0.70. She was started on IV Lasix, IV Cardizem, and IV Heparin. She developed hypotension with this and was reportedly started on Dobutamine intermittently. She also underwent a thoracentesis with removal of 400 mL of fluid. Decision was ultimately made to transfer her to [facility] for TEE/DCCV. Eventually she ended up having cardioversion done.  Then gradually we tried to put her on appropriate medications however difficulties blood pressure being low. She eventually end up going to the hospital recently because of low blood pressure.  Her losartan has been discontinued.  Since that time she seems  to be doing well.  Denies have any chest pain tightness squeezing pressure mid chest no palpitations dizziness swelling of lower extremities.  Past Medical History:  Diagnosis Date   AICD (automatic cardioverter/defibrillator) present 2010   ST JUDE   Atrial fibrillation ([place]) 01/27/2009   Centricity Description: BRADYCARDIA Qualifier: Diagnosis of  By: Darrick Meigs   Centricity Description: ATRIAL ARRHYTHMIAS Qualifier: Diagnosis of  By: Darrick Meigs     Atrial myxoma    s/p resection   ATRIAL MYXOMA 01/27/2009   Qualifier: Diagnosis of  By: Darrick Meigs     Chronic kidney disease    kidney tumor   Congestive heart failure ([place]) 06/27/2021   Essential hypertension 07/20/2010   Qualifier: Diagnosis of  By: Rayann Heman, MD, James     Fibrocystic breast changes of both breasts 09/19/2015   GERD 01/27/2009   Qualifier: Diagnosis of  By: Darrick Meigs     Hypertension    Neoplasm of uncertain behavior of skin 04/15/2018   Neurofibroma of back 03/10/2019   OSTEOPENIA 01/27/2009   Qualifier: Diagnosis of  By: Darrick Meigs     Paroxysmal atrial fibrillation [facility])    Renal benign neoplasm, right 09/28/2020   Right renal mass    Seasonal allergies 09/10/2020   nonproductive cough   SICK SINUS/ TACHY-BRADY SYNDROME 01/27/2009   Qualifier: Diagnosis of  By: Darrick Meigs     Unspecified open wound, right lower leg, sequela 04/16/2016   URI (upper respiratory infection) 09/07/2020  WITH COLD AND COUGH , TOOK 5 DAYS OF ANTIBIOTICS ALL ISSUES RESOLVED PER PT   Verrucas 06/13/2020   Wears glasses    Wears hearing aid in both ears     Past Surgical History:  Procedure Laterality Date   [place]  12/24/2008   Left -- Dr Roxy Manns   BREAST SURGERY  1 ON RIGHT 2 ON LEFT   BIOPSY LAST DONE 1973, AND X 2 1960'S   CARDIOVERSION N/A 07/11/2021   Procedure: CARDIOVERSION;  Surgeon: Skeet Latch, MD;  Location: [place];  Service:  Cardiovascular;  Laterality: N/A;   CHOLECYSTECTOMY  2010   LAPAPROSCIPIC   CYSTOSCOPY W/ URETERAL STENT PLACEMENT Right 09/27/2020   Procedure: CYSTOSCOPY WITH RETROGRADE PYELOGRAM/URETERAL STENT PLACEMENT;  Surgeon: Ceasar Mons, MD;  Location: [facility];  Service: Urology;  Laterality: Right;   IR RADIOLOGIST EVAL & MGMT  08/03/2020   IR RADIOLOGIST EVAL & MGMT  03/07/2021   IR RADIOLOGIST EVAL & MGMT  10/09/2021   PACEMAKER INSERTION  02/02/2009   PARTIAL HYSTERECTOMY  1973   STILL HAS OVARIES   RADIOLOGY WITH ANESTHESIA Right 09/28/2020   Procedure: IR WITH ANESTHESIA CT CRYOABLATION;  Surgeon: Criselda Peaches, MD;  Location: [facility];  Service: Anesthesiology;  Laterality: Right;   stapendectomy Bilateral 1970's   TEE WITHOUT CARDIOVERSION N/A 07/11/2021   Procedure: TRANSESOPHAGEAL ECHOCARDIOGRAM (TEE);  Surgeon: Skeet Latch, MD;  Location: [facility] ENDOSCOPY;  Service: Cardiovascular;  Laterality: N/A;    Current Medications: Current Meds  Medication Sig   acetaminophen (TYLENOL) 500 MG tablet Take 500 mg by mouth every 6 (six) hours as needed for mild pain.   apixaban (ELIQUIS) 2.5 MG TABS tablet Take 1 tablet (2.5 mg total) by mouth 2 (two) times daily.   famotidine (PEPCID) 20 MG tablet Take 20 mg by mouth 2 (two) times daily as needed for heartburn or indigestion.   furosemide (LASIX) 20 MG tablet Take 1 tablet (20 mg total) by mouth daily.   losartan (COZAAR) 25 MG tablet Take 1 tablet (25 mg total) by mouth 2 (two) times daily.   metoprolol succinate (TOPROL-XL) 25 MG 24 hr tablet Take 1 tablet (25 mg total) by mouth daily.   ofloxacin (OCUFLOX) 0.3 % ophthalmic solution Place 1 drop into the left eye in the morning, at noon, in the evening, and at bedtime.   potassium chloride (KLOR-CON) 10 MEQ tablet Take 1 tablet (10 mEq total) by mouth daily.   prednisoLONE acetate (PRED FORTE) 1 % ophthalmic suspension Place 1 drop into the left eye daily.    sulfamethoxazole-trimethoprim (BACTRIM DS) 800-160 MG tablet Take 1 tablet by mouth 2 (two) times daily.     Allergies:   Aspirin, Alendronate sodium, Amiodarone hcl, Bactrim [sulfamethoxazole-trimethoprim], Ibandronate sodium, Risedronate sodium, Statins, Vimovo [naproxen-esomeprazole mg], Xarelto [rivaroxaban], Metaxalone, Mobic [meloxicam], and Toprol xl [metoprolol]   Social History   Socioeconomic History   Marital status: Married    Spouse name: Not on file   Number of children: Not on file   Years of education: Not on file   Highest education level: Not on file  Occupational History   Occupation: retired Pharmacist, hospital  Tobacco Use   Smoking status: Former    Packs/day: 0.25    Years: 12.00    Pack years: 3.00    Types: Cigarettes    Quit date: 06/04/1988    Years since quitting: 33.4   Smokeless tobacco: Never  Vaping Use   Vaping Use: Never used  Substance and Sexual Activity   Alcohol use: Not Currently   Drug use: No   Sexual activity: Not on file  Other Topics Concern   Not on file  Social History Narrative   Lives in [place]   Social Determinants of Health   Financial Resource Strain: Not on file  Food Insecurity: Not on file  Transportation Needs: Not on file  Physical Activity: Not on file  Stress: Not on file  Social Connections: Not on file     Family History: The patient's family history includes Coronary artery disease in an other family member; Dementia in her mother and another family member; Heart attack (age of onset: 35) in her father; Heart disease in her father; Pancreatic cancer in her mother and another family member; Thyroid disease in her mother; Transient ischemic attack in her mother. ROS:   Please see the history of present illness.    All 14 point review of systems negative except as described per history of present illness  EKGs/Labs/Other Studies Reviewed:      Recent Labs: 06/27/2021: NT-Pro BNP 1,361 07/09/2021: ALT 17; B  Natriuretic Peptide 133.5; Hemoglobin 12.0; Magnesium 1.8; Platelets 186 09/19/2021: BUN 17; Creatinine, Ser 0.76; Potassium 4.2; Sodium 142  Recent Lipid Panel    Component Value Date/Time   CHOL  12/22/2008 0150    146        ATP III CLASSIFICATION:  <200     mg/dL   Desirable  200-239  mg/dL   Borderline High  >=240    mg/dL   High          TRIG 83 12/22/2008 0150   HDL 36 (L) 12/22/2008 0150   CHOLHDL 4.1 12/22/2008 0150   VLDL 17 12/22/2008 0150   LDLCALC  12/22/2008 0150    93        Total Cholesterol/HDL:CHD Risk Coronary Heart Disease Risk Table                     Men   Women  1/2 Average Risk   3.4   3.3  Average Risk       5.0   4.4  2 X Average Risk   9.6   7.1  3 X Average Risk  23.4   11.0        Use the calculated Patient Ratio above and the CHD Risk Table to determine the patient's CHD Risk.        ATP III CLASSIFICATION (LDL):  <100     mg/dL   Optimal  100-129  mg/dL   Near or Above                    Optimal  130-159  mg/dL   Borderline  160-189  mg/dL   High  >190     mg/dL   Very High    Physical Exam:    VS:  BP 132/84 (BP Location: Left Arm, Patient Position: Sitting)   Pulse 75   Ht '5\' 5"'$  (1.651 m)   Wt 123 lb 12.8 oz (56.2 kg)   SpO2 99%   BMI 20.60 kg/m     Wt Readings from Last 3 Encounters:  10/23/21 123 lb 12.8 oz (56.2 kg)  09/11/21 119 lb 9.6 oz (54.3 kg)  07/31/21 115 lb 9.6 oz (52.4 kg)     GEN:  Well nourished, well developed in no acute distress HEENT: Normal NECK:  No JVD; No carotid bruits LYMPHATICS: No lymphadenopathy CARDIAC: RRR, no murmurs, no rubs, no gallops RESPIRATORY:  Clear to auscultation without rales, wheezing or rhonchi  ABDOMEN: Soft, non-tender, non-distended MUSCULOSKELETAL:  No edema; No deformity  SKIN: Warm and dry LOWER EXTREMITIES: no swelling NEUROLOGIC:  Alert and oriented x 3 PSYCHIATRIC:  Normal affect   ASSESSMENT:    1. SICK SINUS/ TACHY-BRADY SYNDROME   2. Paroxysmal atrial  fibrillation (HCC)   3. Dilated cardiomyopathy (HCC) ejection fraction 25 to 30% in 2023    PLAN:    In order of problems listed above:  Sick sinus syndrome that being addressed with pacemaker did review interrogation which is from February.  Normal function of the device good battery status.  8 months at least left in device. Paroxysmal atrial fibrillation.  Recently she had a being in the hospital because of hypotension.  There is no documentation of the rhythm side or not she was in atrial fibrillation it is possible that she did have episode of hypotension because of another episode of atrial fibrillation.  I think the best way to answer this will be to have pacemaker interrogated which I will make arrangements for that. Cardiomyopathy severely reduced left ventricle ejection fraction, she had difficulty tolerating medications.  Recently in the up in the hospital because of hypotension.  Plan will be to check how much atrial fibrillation is responsible for it in the meantime we will continue with present medication which include anticoagulation.   Medication Adjustments/Labs and Tests Ordered: Current medicines are reviewed at length with the patient today.  Concerns regarding medicines are outlined above.  No orders of the defined types were placed in this encounter.  Medication changes: No orders of the defined types were placed in this encounter.   Signed, Park Liter, MD, [facility] 10/23/2021 2:43 PM    [place]

## 2021-10-23 NOTE — Patient Instructions (Signed)
Medication Instructions:  Your physician recommends that you continue on your current medications as directed. Please refer to the Current Medication list given to you today.  *If you need a refill on your cardiac medications before your next appointment, please call your pharmacy*   Lab Work: None Ordered If you have labs (blood work) drawn today and your tests are completely normal, you will receive your results only by: MyChart Message (if you have MyChart) OR A paper copy in the mail If you have any lab test that is abnormal or we need to change your treatment, we will call you to review the results.   Testing/Procedures: None Ordered   Follow-Up: At [facility], you and your health needs are our priority.  As part of our continuing mission to provide you with exceptional heart care, we have created designated Provider Care Teams.  These Care Teams include your primary Cardiologist (physician) and Advanced Practice Providers (APPs -  Physician Assistants and Nurse Practitioners) who all work together to provide you with the care you need, when you need it.  We recommend signing up for the patient portal called "MyChart".  Sign up information is provided on this After Visit Summary.  MyChart is used to connect with patients for Virtual Visits (Telemedicine).  Patients are able to view lab/test results, encounter notes, upcoming appointments, etc.  Non-urgent messages can be sent to your provider as well.   To learn more about what you can do with MyChart, go to [email or web address].    Your next appointment:   2 month(s)  The format for your next appointment:   In Person  Provider:   Robert Krasowski, MD    Other Instructions NA  

## 2021-10-24 DIAGNOSIS — H259 Unspecified age-related cataract: Secondary | ICD-10-CM | POA: Diagnosis not present

## 2021-10-24 DIAGNOSIS — H25811 Combined forms of age-related cataract, right eye: Secondary | ICD-10-CM | POA: Diagnosis not present

## 2021-10-31 ENCOUNTER — Telehealth: Payer: Self-pay | Admitting: Cardiology

## 2021-10-31 NOTE — Telephone Encounter (Signed)
Patient states that she received a call today in regards to scheduling an appt with Dr. Agustin Cree. She already has one in July so not sure if this was a mistake. Advised I would send a message over just in case someone was trying to schedule her an earlier appt.

## 2021-12-11 DIAGNOSIS — N6459 Other signs and symptoms in breast: Secondary | ICD-10-CM | POA: Diagnosis not present

## 2021-12-11 HISTORY — DX: Other signs and symptoms in breast: N64.59

## 2021-12-25 ENCOUNTER — Other Ambulatory Visit: Payer: Self-pay | Admitting: *Deleted

## 2021-12-25 MED ORDER — APIXABAN 2.5 MG PO TABS: 2.5000 mg | ORAL_TABLET | Freq: Two times a day (BID) | ORAL | 5 refills | Status: DC

## 2021-12-25 NOTE — Telephone Encounter (Signed)
Prescription refill request for Eliquis received.  Indication: afib  Last office visit: Agustin Cree, 10/23/2021 Scr:  0.76, 09/19/2021 Age: 88 yo  Weight: 56.2 kg   Pt is on the correct dose of Eliquis. Refill sent.

## 2021-12-27 DIAGNOSIS — N83201 Unspecified ovarian cyst, right side: Secondary | ICD-10-CM | POA: Diagnosis not present

## 2021-12-28 ENCOUNTER — Ambulatory Visit: Payer: Medicare PPO | Admitting: Cardiology

## 2021-12-28 DIAGNOSIS — R195 Other fecal abnormalities: Secondary | ICD-10-CM | POA: Diagnosis not present

## 2021-12-28 DIAGNOSIS — K5900 Constipation, unspecified: Secondary | ICD-10-CM | POA: Diagnosis not present

## 2021-12-28 DIAGNOSIS — R109 Unspecified abdominal pain: Secondary | ICD-10-CM | POA: Diagnosis not present

## 2021-12-28 DIAGNOSIS — K649 Unspecified hemorrhoids: Secondary | ICD-10-CM | POA: Diagnosis not present

## 2022-01-01 DIAGNOSIS — R3915 Urgency of urination: Secondary | ICD-10-CM | POA: Diagnosis not present

## 2022-01-01 DIAGNOSIS — D49511 Neoplasm of unspecified behavior of right kidney: Secondary | ICD-10-CM | POA: Diagnosis not present

## 2022-01-03 ENCOUNTER — Ambulatory Visit (INDEPENDENT_AMBULATORY_CARE_PROVIDER_SITE_OTHER): Payer: Medicare PPO

## 2022-01-03 DIAGNOSIS — I420 Dilated cardiomyopathy: Secondary | ICD-10-CM

## 2022-01-03 LAB — CUP PACEART REMOTE DEVICE CHECK
Battery Impedance: 2240 Ohm
Battery Remaining Longevity: 30 mo
Battery Voltage: 2.75 V
Brady Statistic AP VP Percent: 0 %
Brady Statistic AP VS Percent: 79 %
Brady Statistic AS VP Percent: 0 %
Brady Statistic AS VS Percent: 21 %
Date Time Interrogation Session: 20230802184438
Implantable Lead Implant Date: 20100729
Implantable Lead Implant Date: 20100729
Implantable Lead Location: 753859
Implantable Lead Location: 753860
Implantable Lead Model: 5076
Implantable Lead Model: 5076
Implantable Pulse Generator Implant Date: 20100729
Lead Channel Impedance Value: 429 Ohm
Lead Channel Impedance Value: 489 Ohm
Lead Channel Pacing Threshold Amplitude: 0.625 V
Lead Channel Pacing Threshold Amplitude: 1.125 V
Lead Channel Pacing Threshold Pulse Width: 0.4 ms
Lead Channel Pacing Threshold Pulse Width: 0.4 ms
Lead Channel Setting Pacing Amplitude: 2 V
Lead Channel Setting Pacing Amplitude: 2.5 V
Lead Channel Setting Pacing Pulse Width: 0.4 ms
Lead Channel Setting Sensing Sensitivity: 2 mV

## 2022-01-22 DIAGNOSIS — R109 Unspecified abdominal pain: Secondary | ICD-10-CM | POA: Diagnosis not present

## 2022-01-22 DIAGNOSIS — K649 Unspecified hemorrhoids: Secondary | ICD-10-CM | POA: Diagnosis not present

## 2022-01-22 DIAGNOSIS — K5900 Constipation, unspecified: Secondary | ICD-10-CM | POA: Diagnosis not present

## 2022-01-26 DIAGNOSIS — H9193 Unspecified hearing loss, bilateral: Secondary | ICD-10-CM | POA: Diagnosis not present

## 2022-01-26 NOTE — Progress Notes (Signed)
Remote pacemaker transmission.   

## 2022-02-12 ENCOUNTER — Encounter: Payer: Self-pay | Admitting: Cardiology

## 2022-02-12 ENCOUNTER — Ambulatory Visit: Payer: Medicare PPO | Attending: Cardiology | Admitting: Cardiology

## 2022-02-12 VITALS — BP 110/68 | HR 79 | Ht 65.0 in | Wt 121.8 lb

## 2022-02-12 DIAGNOSIS — I495 Sick sinus syndrome: Secondary | ICD-10-CM | POA: Diagnosis not present

## 2022-02-12 LAB — CUP PACEART INCLINIC DEVICE CHECK
Battery Impedance: 2466 Ohm
Battery Remaining Longevity: 26 mo
Battery Voltage: 2.75 V
Brady Statistic AP VP Percent: 0 %
Brady Statistic AP VS Percent: 81 %
Brady Statistic AS VP Percent: 0 %
Brady Statistic AS VS Percent: 18 %
Date Time Interrogation Session: 20230911162722
Implantable Lead Implant Date: 20100729
Implantable Lead Implant Date: 20100729
Implantable Lead Location: 753859
Implantable Lead Location: 753860
Implantable Lead Model: 5076
Implantable Lead Model: 5076
Implantable Pulse Generator Implant Date: 20100729
Lead Channel Impedance Value: 413 Ohm
Lead Channel Impedance Value: 484 Ohm
Lead Channel Pacing Threshold Amplitude: 0.625 V
Lead Channel Pacing Threshold Amplitude: 1.125 V
Lead Channel Pacing Threshold Pulse Width: 0.4 ms
Lead Channel Pacing Threshold Pulse Width: 0.4 ms
Lead Channel Sensing Intrinsic Amplitude: 2 mV
Lead Channel Sensing Intrinsic Amplitude: 5.6 mV
Lead Channel Setting Pacing Amplitude: 2 V
Lead Channel Setting Pacing Amplitude: 2.5 V
Lead Channel Setting Pacing Pulse Width: 0.4 ms
Lead Channel Setting Sensing Sensitivity: 2.8 mV

## 2022-02-12 NOTE — Progress Notes (Signed)
Electrophysiology Office Note   Date:  02/12/2022   ID:  Cassandra Romero, DOB 1933/06/03, MRN [id number]  PCP:  Mateo Flow, MD  Cardiologist:  Agustin Cree Primary Electrophysiologist:  Natha Guin Meredith Leeds, MD    Chief Complaint: pacemaker   History of Present Illness: Cassandra Romero is a 88 y.o. female who is being seen today for the evaluation of pacemaker at the request of Mateo Flow, MD. Presenting today for electrophysiology evaluation.  She has a history significant for paroxysmal atrial fibrillation, tachybradycardia syndrome status post Medtronic pacemaker implanted 2010, atrial myxoma postresection in 2010, hypertension, hyperlipidemia.  Today, she denies symptoms of palpitations, chest pain, shortness of breath, orthopnea, PND, lower extremity edema, claudication, dizziness, presyncope, syncope, bleeding, or neurologic sequela. The patient is tolerating medications without difficulties.    Past Medical History:  Diagnosis Date   AICD (automatic cardioverter/defibrillator) present 2010   [facility] [place]   Atrial fibrillation ([place]) 01/27/2009   Centricity Description: BRADYCARDIA Qualifier: Diagnosis of  By: Darrick Meigs   Centricity Description: ATRIAL ARRHYTHMIAS Qualifier: Diagnosis of  By: Darrick Meigs     Atrial myxoma    s/p resection   ATRIAL MYXOMA 01/27/2009   Qualifier: Diagnosis of  By: Darrick Meigs     Chronic kidney disease    kidney tumor   Congestive heart failure ([place]) 06/27/2021   Essential hypertension 07/20/2010   Qualifier: Diagnosis of  By: Rayann Heman, MD, [place]     Fibrocystic breast changes of both breasts 09/19/2015   GERD 01/27/2009   Qualifier: Diagnosis of  By: Darrick Meigs     Hypertension    Neoplasm of uncertain behavior of skin 04/15/2018   Neurofibroma of back 03/10/2019   OSTEOPENIA 01/27/2009   Qualifier: Diagnosis of  By: Darrick Meigs     Paroxysmal atrial fibrillation ([place])    Renal benign neoplasm, right 09/28/2020   Right  renal mass    Seasonal allergies 09/10/2020   nonproductive cough   SICK SINUS/ TACHY-BRADY SYNDROME 01/27/2009   Qualifier: Diagnosis of  By: Darrick Meigs     Unspecified open wound, right lower leg, sequela 04/16/2016   URI (upper respiratory infection) 09/07/2020   WITH COLD AND COUGH , TOOK 5 DAYS OF ANTIBIOTICS ALL ISSUES RESOLVED PER PT   [place] 06/13/2020   Wears glasses    Wears hearing aid in both ears    Past Surgical History:  Procedure Laterality Date   [place]  12/24/2008   Left -- Dr Roxy Manns   BREAST SURGERY  1 ON RIGHT 2 ON LEFT   BIOPSY LAST DONE 1973, AND X 2 1960'S   CARDIOVERSION N/A 07/11/2021   Procedure: CARDIOVERSION;  Surgeon: Skeet Latch, MD;  Location: [facility];  Service: Cardiovascular;  Laterality: N/A;   CHOLECYSTECTOMY  2010   LAPAPROSCIPIC   CYSTOSCOPY W/ URETERAL STENT PLACEMENT Right 09/27/2020   Procedure: CYSTOSCOPY WITH RETROGRADE PYELOGRAM/URETERAL STENT PLACEMENT;  Surgeon: Ceasar Mons, MD;  Location: [facility];  Service: Urology;  Laterality: Right;   IR RADIOLOGIST EVAL & MGMT  08/03/2020   IR RADIOLOGIST EVAL & MGMT  03/07/2021   IR RADIOLOGIST EVAL & MGMT  10/09/2021   PACEMAKER INSERTION  02/02/2009   PARTIAL HYSTERECTOMY  1973   STILL HAS OVARIES   RADIOLOGY WITH ANESTHESIA Right 09/28/2020   Procedure: IR WITH ANESTHESIA CT CRYOABLATION;  Surgeon: Criselda Peaches, MD;  Location: [facility];  Service: Anesthesiology;  Laterality: Right;   stapendectomy Bilateral 1970's   TEE WITHOUT CARDIOVERSION N/A 07/11/2021   Procedure: TRANSESOPHAGEAL ECHOCARDIOGRAM (TEE);  Surgeon: Skeet Latch, MD;  Location[facility];  Service: Cardiovascular;  Laterality: N/A;     Current Outpatient Medications  Medication Sig Dispense Refill   acetaminophen (TYLENOL) 500 MG tablet Take 500 mg by mouth every 6 (six) hours as needed for mild pain.     apixaban (ELIQUIS)  2.5 MG TABS tablet Take 1 tablet (2.5 mg total) by mouth 2 (two) times daily. 60 tablet 5   dicyclomine (BENTYL) 10 MG capsule Take 10 mg by mouth 3 (three) times daily as needed.     famotidine (PEPCID) 20 MG tablet Take 20 mg by mouth 2 (two) times daily as needed for heartburn or indigestion.     furosemide (LASIX) 20 MG tablet Take 1 tablet (20 mg total) by mouth daily. 90 tablet 3   losartan (COZAAR) 25 MG tablet Take 1 tablet (25 mg total) by mouth 2 (two) times daily. 180 tablet 3   metoprolol succinate (TOPROL-XL) 25 MG 24 hr tablet Take 1 tablet (25 mg total) by mouth daily. 90 tablet 3   potassium chloride (KLOR-CON) 10 MEQ tablet Take 1 tablet (10 mEq total) by mouth daily. 90 tablet 3   No current facility-administered medications for this visit.    Allergies:   Aspirin, Alendronate sodium, Amiodarone hcl, Bactrim [sulfamethoxazole-trimethoprim], Ibandronate sodium, Risedronate sodium, Statins, Vimovo [naproxen-esomeprazole mg], Xarelto [rivaroxaban], Metaxalone, Mobic [meloxicam], and Toprol xl [metoprolol]   Social History:  The patient  reports that she quit smoking about 33 years ago. Her smoking use included cigarettes. She has a 3.00 pack-year smoking history. She has never used smokeless tobacco. She reports that she does not currently use alcohol. She reports that she does not use drugs.   Family History:  The patient's family history includes Coronary artery disease in an other family member; Dementia in her mother and another family member; Heart attack (age of onset: 77) in her father; Heart disease in her father; Pancreatic cancer in her mother and another family member; Thyroid disease in her mother; Transient ischemic attack in her mother.    ROS:  Please see the history of present illness.   Otherwise, review of systems is positive for none.   All other systems are reviewed and negative.    PHYSICAL EXAM: VS:  BP 110/68   Pulse 79   Ht '5\' 5"'$  (1.651 m)   Wt 121 lb  12.8 oz (55.2 kg)   SpO2 90%   BMI 20.27 kg/m  , BMI Body mass index is 20.27 kg/m. GEN: Well nourished, well developed, in no acute distress  HEENT: normal  Neck: no JVD, carotid bruits, or masses Cardiac: RRR; no murmurs, rubs, or gallops,no edema  Respiratory:  clear to auscultation bilaterally, normal work of breathing GI: soft, nontender, nondistended, + BS MS: no deformity or atrophy  Skin: warm and dry, device pocket is well healed Neuro:  Strength and sensation are intact Psych: euthymic mood, full affect  EKG:  EKG is ordered today. Personal review of the ekg ordered shows sinus rhythm, rate 79, incomplete right bundle branch block  Device interrogation is reviewed today in detail.  See PaceArt for details.   Recent Labs: 06/27/2021: NT-Pro BNP 1,361 07/09/2021: ALT 17; B Natriuretic Peptide 133.5; Hemoglobin 12.0; Magnesium 1.8; Platelets 186 09/19/2021: BUN 17; Creatinine, Ser 0.76; Potassium 4.2; Sodium 142    Lipid Panel     Component  Value Date/Time   CHOL  12/22/2008 0150    146        ATP III CLASSIFICATION:  <200     mg/dL   Desirable  200-239  mg/dL   Borderline High  >=240    mg/dL   High          TRIG 83 12/22/2008 0150   HDL 36 (L) 12/22/2008 0150   CHOLHDL 4.1 12/22/2008 0150   VLDL 17 12/22/2008 0150   LDLCALC  12/22/2008 0150    93        Total Cholesterol/HDL:CHD Risk Coronary Heart Disease Risk Table                     Men   Women  1/2 Average Risk   3.4   3.3  Average Risk       5.0   4.4  2 X Average Risk   9.6   7.1  3 X Average Risk  23.4   11.0        Use the calculated Patient Ratio above and the CHD Risk Table to determine the patient's CHD Risk.        ATP III CLASSIFICATION (LDL):  <100     mg/dL   Optimal  100-129  mg/dL   Near or Above                    Optimal  130-159  mg/dL   Borderline  160-189  mg/dL   High  >190     mg/dL   Very High     Wt Readings from Last 3 Encounters:  02/12/22 121 lb 12.8 oz (55.2 kg)   10/23/21 123 lb 12.8 oz (56.2 kg)  09/11/21 119 lb 9.6 oz (54.3 kg)      Other studies Reviewed: Additional studies/ records that were reviewed today include: TTE 06/29/21  Review of the above records today demonstrates:   1. Resting tachycardia at 125 BPM, possible atrial flutter. Left  ventricular ejection fraction, by estimation, is 30 to 35%. The left  ventricle has moderate to severely decreased function. Left ventricular  endocardial border not optimally defined to  evaluate regional wall motion. Left ventricular diastolic parameters are  indeterminate.   2. RV > LV size. Right ventricular systolic function is moderately  reduced. The right ventricular size is mildly enlarged.   3. The mitral valve is normal in structure. Mild mitral valve  regurgitation. No evidence of mitral stenosis.   4. TR is not well defined, color doppler splay is noted      TR may be underestimated. Tricuspid valve regurgitation is mild to  moderate.   5. Tachycardia at 125 BPM. The aortic valve is tricuspid. Aortic valve  regurgitation is not visualized. Aortic valve sclerosis is present, with  no evidence of aortic valve stenosis.   6. The inferior vena cava is dilated in size with <50% respiratory  variability, suggesting right atrial pressure of 15 mmHg.    ASSESSMENT AND PLAN:  1.  Tachybradycardia syndrome: Status post Medtronic dual-chamber pacemaker implanted 2010.  Device functioning appropriately.  No changes at this time.  2.  Paroxysmal atrial fibrillation: Currently on Eliquis 2.5 mg twice daily.  CHA2DS2-VASc of at least 4.  She has a low burden on her pacemaker.  No changes at this time.  3.  Dilated cardiomyopathy: Ejection fraction 25 to 30%.  On losartan 25 mg twice daily, Toprol-XL 25 mg daily.  Plan per  primary cardiology.  Current medicines are reviewed at length with the patient today.   The patient does not have concerns regarding her medicines.  The following changes were  made today:  none  Labs/ tests ordered today include:  Orders Placed This Encounter  Procedures   EKG 12-Lead     Disposition:   FU with Cing Speculator 1 year  Signed, Jyll Tomaro Meredith Leeds, MD  02/12/2022 4:16 PM     [address] [place] [address] [phone] (office) [phone] (fax)

## 2022-02-12 NOTE — Patient Instructions (Signed)
Medication Instructions:  Your physician recommends that you continue on your current medications as directed. Please refer to the Current Medication list given to you today.  *If you need a refill on your cardiac medications before your next appointment, please call your pharmacy*   Lab Work: None ordered   Testing/Procedures: None ordered   Follow-Up: At [facility], you and your health needs are our priority.  As part of our continuing mission to provide you with exceptional heart care, we have created designated Provider Care Teams.  These Care Teams include your primary Cardiologist (physician) and Advanced Practice Providers (APPs -  Physician Assistants and Nurse Practitioners) who all work together to provide you with the care you need, when you need it.  We recommend signing up for the patient portal called "MyChart".  Sign up information is provided on this After Visit Summary.  MyChart is used to connect with patients for Virtual Visits (Telemedicine).  Patients are able to view lab/test results, encounter notes, upcoming appointments, etc.  Non-urgent messages can be sent to your provider as well.   To learn more about what you can do with MyChart, go to [email or web address].    Your next appointment:   1 year(s)  The format for your next appointment:   In Person  Provider:   Allegra Lai, MD    Thank you for choosing [facility]!!   Trinidad Curet, RN [phone]  Other Instructions   Important Information About Sugar

## 2022-03-09 DIAGNOSIS — Z23 Encounter for immunization: Secondary | ICD-10-CM | POA: Diagnosis not present

## 2022-03-14 DIAGNOSIS — J309 Allergic rhinitis, unspecified: Secondary | ICD-10-CM | POA: Diagnosis not present

## 2022-03-14 DIAGNOSIS — R070 Pain in throat: Secondary | ICD-10-CM | POA: Diagnosis not present

## 2022-03-14 DIAGNOSIS — R051 Acute cough: Secondary | ICD-10-CM | POA: Diagnosis not present

## 2022-03-20 ENCOUNTER — Ambulatory Visit: Payer: Medicare PPO | Attending: Cardiology | Admitting: Cardiology

## 2022-03-20 ENCOUNTER — Encounter: Payer: Self-pay | Admitting: Cardiology

## 2022-03-20 VITALS — BP 142/70 | HR 75 | Ht 65.0 in | Wt 126.4 lb

## 2022-03-20 DIAGNOSIS — R0609 Other forms of dyspnea: Secondary | ICD-10-CM

## 2022-03-20 DIAGNOSIS — I480 Paroxysmal atrial fibrillation: Secondary | ICD-10-CM | POA: Diagnosis not present

## 2022-03-20 DIAGNOSIS — I495 Sick sinus syndrome: Secondary | ICD-10-CM

## 2022-03-20 DIAGNOSIS — C541 Malignant neoplasm of endometrium: Secondary | ICD-10-CM | POA: Diagnosis not present

## 2022-03-20 DIAGNOSIS — I420 Dilated cardiomyopathy: Secondary | ICD-10-CM | POA: Diagnosis not present

## 2022-03-20 DIAGNOSIS — R109 Unspecified abdominal pain: Secondary | ICD-10-CM | POA: Diagnosis not present

## 2022-03-20 DIAGNOSIS — I700 Atherosclerosis of aorta: Secondary | ICD-10-CM | POA: Diagnosis not present

## 2022-03-20 NOTE — Patient Instructions (Signed)
Medication Instructions:  Your physician recommends that you continue on your current medications as directed. Please refer to the Current Medication list given to you today.  *If you need a refill on your cardiac medications before your next appointment, please call your pharmacy*   Lab Work: None Ordered If you have labs (blood work) drawn today and your tests are completely normal, you will receive your results only by: MyChart Message (if you have MyChart) OR A paper copy in the mail If you have any lab test that is abnormal or we need to change your treatment, we will call you to review the results.   Testing/Procedures: Your physician has requested that you have an echocardiogram. Echocardiography is a painless test that uses sound waves to create images of your heart. It provides your doctor with information about the size and shape of your heart and how well your heart's chambers and valves are working. This procedure takes approximately one hour. There are no restrictions for this procedure. Please do NOT wear cologne, perfume, aftershave, or lotions (deodorant is allowed). Please arrive 15 minutes prior to your appointment time.    Follow-Up: At [facility], you and your health needs are our priority.  As part of our continuing mission to provide you with exceptional heart care, we have created designated Provider Care Teams.  These Care Teams include your primary Cardiologist (physician) and Advanced Practice Providers (APPs -  Physician Assistants and Nurse Practitioners) who all work together to provide you with the care you need, when you need it.  We recommend signing up for the patient portal called "MyChart".  Sign up information is provided on this After Visit Summary.  MyChart is used to connect with patients for Virtual Visits (Telemedicine).  Patients are able to view lab/test results, encounter notes, upcoming appointments, etc.  Non-urgent messages can be sent to your  provider as well.   To learn more about what you can do with MyChart, go to [email or web address].    Your next appointment:   2 month(s)  The format for your next appointment:   In Person  Provider:   Robert Krasowski, MD    Other Instructions NA  

## 2022-03-20 NOTE — Addendum Note (Signed)
Addended by: Jacobo Forest D on: 03/20/2022 11:10 AM   Modules accepted: Orders

## 2022-03-20 NOTE — Progress Notes (Signed)
Cardiology Office Note:    Date:  03/20/2022   ID:  Cassandra Romero, DOB [birth year], MRN [id number]  PCP:  Mateo Flow, MD  Cardiologist:  Jenne Campus, MD    Referring MD: Mateo Flow, MD   Chief Complaint  Patient presents with   Follow-up    History of Present Illness:    Cassandra Romero is a 88 y.o. female  with a history of paroxysmal atrial fibrillation, tachy-brady syndrome s/p Medtronic PPM in 2010, atrial myxoma s/p resection in 2010 hypertension, and hyperlipidemia who was transferred from [facility] on 07/09/2021 for further management of atrial flutter with RVR and acute CHF.   Patient was recently seen by me on 06/2021 with dyspnea on exertion and weight gain. She was noted to be tachycardic at that visit and EKG showed atrial flutter with rates in the 120s.  She was started on Lasix and IV potassium supplement.  Echo on 06/27/2021 showed LVEF of 30-35%, mildly enlarged RV and moderately reduced RV systolic function, mild MR, and mild to moderate TR.     She then presented to to [facility] on 07/06/2021 for evaluation of worsening palpitations and shortness of breath. Troponin I negative. Pro BNP elevated at 1,650. CTA negative for PE  and any coronary calcification but did show a large left pleural effusion. WBC 4.9, Hgb 12.2, Plts 163. Na 136, K 3.2, Cr 0.70. She was started on IV Lasix, IV Cardizem, and IV Heparin. She developed hypotension with this and was reportedly started on Dobutamine intermittently. She also underwent a thoracentesis with removal of 400 mL of fluid. Decision was ultimately made to transfer her to [facility] for TEE/D[facility]V. Eventually she ended up having cardioversion done.  Then gradually we tried to put her on appropriate medications however difficulties blood pressure being low. She comes today 2 months for follow-up.  Overall she is doing well.  Denies have any chest pain tightness squeezing pressure burning chest.  She thinks metoprolol  gave her stomach upset.  Past Medical History:  Diagnosis Date   AICD (automatic cardioverter/defibrillator) present 2010   ST JUDE   Atrial fibrillation ([place]) 01/27/2009   Centricity Description: BRADYCARDIA Qualifier: Diagnosis of  By: Darrick Meigs   Centricity Description: ATRIAL ARRHYTHMIAS Qualifier: Diagnosis of  By: Darrick Meigs     Atrial myxoma    s/p resection   ATRIAL MYXOMA 01/27/2009   Qualifier: Diagnosis of  By: Darrick Meigs     Chronic kidney disease    kidney tumor   Congestive heart failure ([place]) 06/27/2021   Essential hypertension 07/20/2010   Qualifier: Diagnosis of  By: Rayann Heman, MD, [place]     Fibrocystic breast changes of both breasts 09/19/2015   GERD 01/27/2009   Qualifier: Diagnosis of  By: Darrick Meigs     Hypertension    Neoplasm of uncertain behavior of skin 04/15/2018   Neurofibroma of back 03/10/2019   OSTEOPENIA 01/27/2009   Qualifier: Diagnosis of  By: Darrick Meigs     Paroxysmal atrial fibrillation [facility])    Renal benign neoplasm, right 09/28/2020   Right renal mass    Seasonal allergies 09/10/2020   nonproductive cough   SICK SINUS/ TACHY-BRADY SYNDROME 01/27/2009   Qualifier: Diagnosis of  By: Darrick Meigs     Unspecified open wound, right lower leg, sequela 04/16/2016   URI (upper respiratory infection) 09/07/2020   WITH COLD AND COUGH , TOOK 5 DAYS OF ANTIBIOTICS ALL ISSUES RESOLVED PER PT   [place] 06/13/2020  Wears glasses    Wears hearing aid in both ears     Past Surgical History:  Procedure Laterality Date   [place]  12/24/2008   Left -- Dr Roxy Manns   BREAST SURGERY  1 ON RIGHT 2 ON LEFT   BIOPSY LAST DONE 1973, AND X 2 1960'S   CARDIOVERSION N/A 07/11/2021   Procedure: CARDIOVERSION;  Surgeon: Skeet Latch, MD;  Location: [facility];  Service: Cardiovascular;  Laterality: N/A;   CHOLECYSTECTOMY  2010   LAPAPROSCIPIC   CYSTOSCOPY W/ URETERAL STENT PLACEMENT Right  09/27/2020   Procedure: CYSTOSCOPY WITH RETROGRADE PYELOGRAM/URETERAL STENT PLACEMENT;  Surgeon: Ceasar Mons, MD;  Location: [facility];  Service: Urology;  Laterality: Right;   IR RADIOLOGIST EVAL & MGMT  08/03/2020   IR RADIOLOGIST EVAL & MGMT  03/07/2021   IR RADIOLOGIST EVAL & MGMT  10/09/2021   PACEMAKER INSERTION  02/02/2009   PARTIAL HYSTERECTOMY  1973   STILL HAS OVARIES   RADIOLOGY WITH ANESTHESIA Right 09/28/2020   Procedure: IR WITH ANESTHESIA CT CRYOABLATION;  Surgeon: Criselda Peaches, MD;  Location: [facility];  Service: Anesthesiology;  Laterality: Right;   stapendectomy Bilateral 1970's   TEE WITHOUT CARDIOVERSION N/A 07/11/2021   Procedure: TRANSESOPHAGEAL ECHOCARDIOGRAM (TEE);  Surgeon: Skeet Latch, MD;  [facility];  Service: Cardiovascular;  Laterality: N/A;    Current Medications: Current Meds  Medication Sig   acetaminophen (TYLENOL) 500 MG tablet Take 500 mg by mouth every 6 (six) hours as needed for mild pain.   apixaban (ELIQUIS) 2.5 MG TABS tablet Take 1 tablet (2.5 mg total) by mouth 2 (two) times daily.   dicyclomine (BENTYL) 10 MG capsule Take 10 mg by mouth 3 (three) times daily as needed.   famotidine (PEPCID) 20 MG tablet Take 20 mg by mouth 2 (two) times daily as needed for heartburn or indigestion.   furosemide (LASIX) 20 MG tablet Take 1 tablet (20 mg total) by mouth daily.   losartan (COZAAR) 25 MG tablet Take 1 tablet (25 mg total) by mouth 2 (two) times daily.   metoprolol succinate (TOPROL-XL) 25 MG 24 hr tablet Take 1 tablet (25 mg total) by mouth daily.   potassium chloride (KLOR-CON) 10 MEQ tablet Take 1 tablet (10 mEq total) by mouth daily.     Allergies:   Aspirin, Alendronate sodium, Amiodarone hcl, Bactrim [sulfamethoxazole-trimethoprim], Ibandronate sodium, Risedronate sodium, Statins, Vimovo [naproxen-esomeprazole mg], Xarelto [rivaroxaban], Metaxalone, Mobic [meloxicam], and Toprol xl [metoprolol]    Social History   Socioeconomic History   Marital status: Married    Spouse name: Not on file   Number of children: Not on file   Years of education: Not on file   Highest education level: Not on file  Occupational History   Occupation: retired Pharmacist, hospital  Tobacco Use   Smoking status: Former    Packs/day: 0.25    Years: 12.00    Total pack years: 3.00    Types: Cigarettes    Quit date: 06/04/1988    Years since quitting: 33.8   Smokeless tobacco: Never  Vaping Use   Vaping Use: Never used  Substance and Sexual Activity   Alcohol use: Not Currently   Drug use: No   Sexual activity: Not on file  Other Topics Concern   Not on file  Social History Narrative   Lives in [place] Determinants of Health   Financial Resource Strain: Not on file  Food Insecurity: Not on file  Transportation Needs: Not on file  Physical Activity: Not on file  Stress: Not on file  Social Connections: Not on file     Family History: The patient's family history includes Coronary artery disease in an other family member; Dementia in her mother and another family member; Heart attack (age of onset: 69) in her father; Heart disease in her father; Pancreatic cancer in her mother and another family member; Thyroid disease in her mother; Transient ischemic attack in her mother. ROS:   Please see the history of present illness.    All 14 point review of systems negative except as described per history of present illness  EKGs/Labs/Other Studies Reviewed:      Recent Labs: 06/27/2021: NT-Pro BNP 1,361 07/09/2021: ALT 17; B Natriuretic Peptide 133.5; Hemoglobin 12.0; Magnesium 1.8; Platelets 186 09/19/2021: BUN 17; Creatinine, Ser 0.76; Potassium 4.2; Sodium 142  Recent Lipid Panel    Component Value Date/Time   CHOL  12/22/2008 0150    146        ATP III CLASSIFICATION:  <200     mg/dL   Desirable  200-239  mg/dL   Borderline High  >=240    mg/dL   High          TRIG 83 12/22/2008 0150    HDL 36 (L) 12/22/2008 0150   CHOLHDL 4.1 12/22/2008 0150   VLDL 17 12/22/2008 0150   LDLCALC  12/22/2008 0150    93        Total Cholesterol/HDL:CHD Risk Coronary Heart Disease Risk Table                     Men   Women  1/2 Average Risk   3.4   3.3  Average Risk       5.0   4.4  2 X Average Risk   9.6   7.1  3 X Average Risk  23.4   11.0        Use the calculated Patient Ratio above and the CHD Risk Table to determine the patient's CHD Risk.        ATP III CLASSIFICATION (LDL):  <100     mg/dL   Optimal  100-129  mg/dL   Near or Above                    Optimal  130-159  mg/dL   Borderline  160-189  mg/dL   High  >190     mg/dL   Very High    Physical Exam:    VS:  BP (!) 142/70 (BP Location: Left Arm, Patient Position: Sitting)   Pulse 75   Ht '5\' 5"'$  (1.651 m)   Wt 126 lb 6.4 oz (57.3 kg)   SpO2 (!) 75%   BMI 21.03 kg/m     Wt Readings from Last 3 Encounters:  03/20/22 126 lb 6.4 oz (57.3 kg)  02/12/22 121 lb 12.8 oz (55.2 kg)  10/23/21 123 lb 12.8 oz (56.2 kg)     GEN:  Well nourished, well developed in no acute distress HEENT: Normal NECK: No JVD; No carotid bruits LYMPHATICS: No lymphadenopathy CARDIAC: RRR, no murmurs, no rubs, no gallops RESPIRATORY:  Clear to auscultation without rales, wheezing or rhonchi  ABDOMEN: Soft, non-tender, non-distended MUSCULOSKELETAL:  No edema; No deformity  SKIN: Warm and dry LOWER EXTREMITIES: no swelling NEUROLOGIC:  Alert and oriented x 3 PSYCHIATRIC:  Normal affect   ASSESSMENT:  1. SICK SINUS/ TACHY-BRADY SYNDROME   2. Paroxysmal atrial fibrillation (HCC)   3. Dilated cardiomyopathy (HCC) ejection fraction 25 to 30% in 2023    PLAN:    In order of problems listed above:  Cardiomyopathy with severely diminished left ventricle ejection fraction.  On the physical exam she looks compensated.  I have difficulty putting her on appropriate medications to the point that she want to stop Toprol-XL because she  thinks that it create some stomach upset I told her it is okay to hold for about a week and see if she feels any better but I also expressed my opinion that I doubt very much that Toprol-XL create a problem.  In the meantime so far she is only on Cozaar.  Today her blood pressure is low to be on the higher side because she was rushing usually blood pressure is low and that is the biggest obstacle for me to put her on appropriate medications I think is of value to do her echocardiogram to recheck left ventricle ejection fraction for still diminished or worse we will definitely push harder to put her on the right medications Proximal atrial fibrillation did review her pacemaker interrogation which was done on February 12, 2022.  There is no recent episode of atrial fibrillation.  She does have Adapta L Medtronic device 27 months left in the device I did review interrogation Sick sinus syndrome.  Stable. Congestive heart failure: Compensated   Medication Adjustments/Labs and Tests Ordered: Current medicines are reviewed at length with the patient today.  Concerns regarding medicines are outlined above.  No orders of the defined types were placed in this encounter.  Medication changes: No orders of the defined types were placed in this encounter.   Signed, Park Liter, MD, [facility] 03/20/2022 10:58 AM    [place]

## 2022-03-26 ENCOUNTER — Telehealth: Payer: Self-pay

## 2022-03-26 ENCOUNTER — Telehealth: Payer: Self-pay | Admitting: Cardiology

## 2022-03-26 NOTE — Telephone Encounter (Signed)
   [facility] Pre-operative Risk Assessment    Request for surgical clearance:  What type of surgery is being performed? Dental Extraction(site not specified   When is this surgery scheduled? TBD   What type of clearance is required (medical clearance vs. Pharmacy clearance to hold med vs. Both)? Pharmacy  Are there any medications that need to be held prior to surgery and how long?Eliquis- holding length not specified   Practice name and name of physician performing surgery? [facility] not specified  What is your office phone number: [phone]    7.   What is your office fax number: [phone]  8.   Anesthesia type (None, local, MAC, general) ? Not specified   Basil Dess Langston [place] 03/26/2022, 9:00 AM  _________________________________________________________________   (provider comments below)

## 2022-03-26 NOTE — Telephone Encounter (Signed)
   Pre-operative Risk Assessment    Patient Name: Cassandra Romero  DOB: [birth year] MRN: [id number]     Request for Surgical Clearance    Procedure:  Dental Extraction - Amount of Teeth to be Pulled:  1  Date of Surgery:  Clearance 03/27/22  at 10:30 am                            Surgeon:  Dr. Andrez Grime  Surgeon's Group or Practice Name:  [facility] in [place]  Phone number:  [phone] Fax number:  [phone]   Type of Clearance Requested:   - Pharmacy:  Hold Apixaban (Eliquis) TBD  by cardiology   Type of Anesthesia:  Local    Additional requests/questions:    Crist Infante   03/26/2022, 1:36 PM

## 2022-03-26 NOTE — Telephone Encounter (Signed)
I s/w Alexis at [place] office. I confirmed the procedure is just for 1 tooth only to be extracted at this time, under Local anesthesia. I will forward back to pre op provider for review.

## 2022-03-26 NOTE — Telephone Encounter (Signed)
    Primary Cardiologist: Jenne Campus, MD  Chart reviewed as part of pre-operative protocol coverage. Simple dental extractions are considered low risk procedures per guidelines and generally do not require any specific cardiac clearance. It is also generally accepted that for simple extractions and dental cleanings, there is no need to interrupt blood thinner therapy.   SBE prophylaxis is not required for the patient.  I will route this recommendation to the requesting party via Epic fax function and remove from pre-op pool.  Please call with questions.  Deberah Pelton, NP 03/26/2022, 2:11 PM

## 2022-03-26 NOTE — Telephone Encounter (Signed)
Preoperative team, we are not able to provide blanket coverage.  Please contact requesting office to verify procedure being performed and amount of teeth being extracted.  Thank you for your help.  Once details are received we will be able to provide recommendations on Eliquis hold and cardiac evaluation.  Thank you for your help.   Jossie Ng. Zakari Bathe NP-C     03/26/2022, 11:04 AM [place] [address] Office [phone] Fax [phone]

## 2022-03-30 ENCOUNTER — Other Ambulatory Visit: Payer: Medicare PPO

## 2022-04-04 ENCOUNTER — Ambulatory Visit (INDEPENDENT_AMBULATORY_CARE_PROVIDER_SITE_OTHER): Payer: Medicare PPO

## 2022-04-04 ENCOUNTER — Ambulatory Visit: Payer: Medicare PPO | Attending: Cardiology

## 2022-04-04 DIAGNOSIS — R0609 Other forms of dyspnea: Secondary | ICD-10-CM

## 2022-04-04 DIAGNOSIS — I420 Dilated cardiomyopathy: Secondary | ICD-10-CM | POA: Diagnosis not present

## 2022-04-04 LAB — ECHOCARDIOGRAM COMPLETE
Area-P 1/2: 3.85 cm2
MV M vel: 4.67 m/s
MV Peak grad: 87.2 mmHg
S' Lateral: 2.9 cm

## 2022-04-05 LAB — CUP PACEART REMOTE DEVICE CHECK
Battery Impedance: 2612 Ohm
Battery Remaining Longevity: 25 mo
Battery Voltage: 2.74 V
Brady Statistic AP VP Percent: 0 %
Brady Statistic AP VS Percent: 74 %
Brady Statistic AS VP Percent: 0 %
Brady Statistic AS VS Percent: 26 %
Date Time Interrogation Session: 20231101154423
Implantable Lead Connection Status: 753985
Implantable Lead Connection Status: 753985
Implantable Lead Implant Date: 20100729
Implantable Lead Implant Date: 20100729
Implantable Lead Location: 753859
Implantable Lead Location: 753860
Implantable Lead Model: 5076
Implantable Lead Model: 5076
Implantable Pulse Generator Implant Date: 20100729
Lead Channel Impedance Value: 434 Ohm
Lead Channel Impedance Value: 507 Ohm
Lead Channel Pacing Threshold Amplitude: 0.75 V
Lead Channel Pacing Threshold Amplitude: 1.125 V
Lead Channel Pacing Threshold Pulse Width: 0.4 ms
Lead Channel Pacing Threshold Pulse Width: 0.4 ms
Lead Channel Setting Pacing Amplitude: 2 V
Lead Channel Setting Pacing Amplitude: 2.5 V
Lead Channel Setting Pacing Pulse Width: 0.4 ms
Lead Channel Setting Sensing Sensitivity: 2 mV
Zone Setting Status: 755011
Zone Setting Status: 755011

## 2022-04-06 ENCOUNTER — Telehealth: Payer: Self-pay

## 2022-04-06 NOTE — Telephone Encounter (Signed)
Patient notified of results.

## 2022-04-06 NOTE — Telephone Encounter (Signed)
-----   Message from Park Liter, MD sent at 04/06/2022  9:21 AM EDT ----- Echocardiogram showed low normal ejection fraction, overall looks good

## 2022-04-13 DIAGNOSIS — Z1231 Encounter for screening mammogram for malignant neoplasm of breast: Secondary | ICD-10-CM | POA: Diagnosis not present

## 2022-04-17 NOTE — Progress Notes (Signed)
Remote pacemaker transmission.   

## 2022-04-23 DIAGNOSIS — N6012 Diffuse cystic mastopathy of left breast: Secondary | ICD-10-CM | POA: Diagnosis not present

## 2022-04-23 DIAGNOSIS — N6011 Diffuse cystic mastopathy of right breast: Secondary | ICD-10-CM | POA: Diagnosis not present

## 2022-05-04 DIAGNOSIS — M62838 Other muscle spasm: Secondary | ICD-10-CM | POA: Diagnosis not present

## 2022-05-31 ENCOUNTER — Ambulatory Visit: Payer: Medicare PPO | Admitting: Cardiology

## 2022-06-16 ENCOUNTER — Other Ambulatory Visit: Payer: Self-pay | Admitting: Cardiology

## 2022-06-22 ENCOUNTER — Other Ambulatory Visit: Payer: Self-pay | Admitting: Cardiology

## 2022-06-22 DIAGNOSIS — I480 Paroxysmal atrial fibrillation: Secondary | ICD-10-CM

## 2022-06-22 NOTE — Telephone Encounter (Signed)
Eliquis 2.'5mg'$  refill request received. Patient is 89 years old, weight-57.3kg, Crea-0.76 on 09/19/2021, Diagnosis-Afib, and last seen by Dr. Agustin Cree on 03/20/22. Dose is appropriate based on dosing criteria. Will send in refill to requested pharmacy.

## 2022-07-04 ENCOUNTER — Ambulatory Visit: Payer: Medicare PPO

## 2022-07-04 DIAGNOSIS — I420 Dilated cardiomyopathy: Secondary | ICD-10-CM

## 2022-07-05 LAB — CUP PACEART REMOTE DEVICE CHECK
Battery Impedance: 2628 Ohm
Battery Remaining Longevity: 26 mo
Battery Voltage: 2.74 V
Brady Statistic AP VP Percent: 0 %
Brady Statistic AP VS Percent: 46 %
Brady Statistic AS VP Percent: 0 %
Brady Statistic AS VS Percent: 54 %
Date Time Interrogation Session: 20240131174808
Implantable Lead Connection Status: 753985
Implantable Lead Connection Status: 753985
Implantable Lead Implant Date: 20100729
Implantable Lead Implant Date: 20100729
Implantable Lead Location: 753859
Implantable Lead Location: 753860
Implantable Lead Model: 5076
Implantable Lead Model: 5076
Implantable Pulse Generator Implant Date: 20100729
Lead Channel Impedance Value: 430 Ohm
Lead Channel Impedance Value: 506 Ohm
Lead Channel Pacing Threshold Amplitude: 0.75 V
Lead Channel Pacing Threshold Amplitude: 1.125 V
Lead Channel Pacing Threshold Pulse Width: 0.4 ms
Lead Channel Pacing Threshold Pulse Width: 0.4 ms
Lead Channel Setting Pacing Amplitude: 2 V
Lead Channel Setting Pacing Amplitude: 2.5 V
Lead Channel Setting Pacing Pulse Width: 0.4 ms
Lead Channel Setting Sensing Sensitivity: 2 mV
Zone Setting Status: 755011
Zone Setting Status: 755011

## 2022-07-06 ENCOUNTER — Ambulatory Visit: Payer: Medicare PPO | Attending: Cardiology | Admitting: Cardiology

## 2022-07-06 ENCOUNTER — Telehealth: Payer: Self-pay

## 2022-07-06 VITALS — BP 138/80 | HR 76 | Ht 65.0 in | Wt 127.6 lb

## 2022-07-06 DIAGNOSIS — D151 Benign neoplasm of heart: Secondary | ICD-10-CM

## 2022-07-06 DIAGNOSIS — Z79899 Other long term (current) drug therapy: Secondary | ICD-10-CM | POA: Diagnosis not present

## 2022-07-06 DIAGNOSIS — I420 Dilated cardiomyopathy: Secondary | ICD-10-CM

## 2022-07-06 DIAGNOSIS — I10 Essential (primary) hypertension: Secondary | ICD-10-CM

## 2022-07-06 MED ORDER — ENTRESTO 24-26 MG PO TABS
1.0000 | ORAL_TABLET | Freq: Two times a day (BID) | ORAL | 1 refills | Status: DC
Start: 2022-07-06 — End: 2022-09-03

## 2022-07-06 NOTE — Patient Instructions (Signed)
Medication Instructions:  Your physician has recommended you make the following change in your medication:  Stop Losartan  Start Entresto 24/'26mg'$  1 tablet twice daily *If you need a refill on your cardiac medications before your next appointment, please call your pharmacy*   Lab Work: Your physician recommends that you return for lab work in: [facility] next [place] If you have labs (blood work) drawn today and your tests are completely normal, you will receive your results only by: [place] (if you have [place]) OR A paper copy in the mail If you have any lab test that is abnormal or we need to change your treatment, we will call you to review the results.   Testing/Procedures: None   Follow-Up: At [facility], you and your health needs are our priority.  As part of our continuing mission to provide you with exceptional heart care, we have created designated Provider Care Teams.  These Care Teams include your primary Cardiologist (physician) and Advanced Practice Providers (APPs -  Physician Assistants and Nurse Practitioners) who all work together to provide you with the care you need, when you need it.  We recommend signing up for the patient portal called "MyChart".  Sign up information is provided on this After Visit Summary.  MyChart is used to connect with patients for Virtual Visits (Telemedicine).  Patients are able to view lab/test results, encounter notes, upcoming appointments, etc.  Non-urgent messages can be sent to your provider as well.   To learn more about what you can do with MyChart, go to [email or web address].    Your next appointment:   5 month(s)  Provider:   Jenne Campus, MD    Other Instructions

## 2022-07-06 NOTE — Progress Notes (Signed)
Cardiology Office Note:    Date:  07/06/2022   ID:  VI BIDDINGER, DOB 17-Jan-1933, MRN [id number]  PCP:  Mateo Flow, MD  Cardiologist:  Jenne Campus, MD    Referring MD: Mateo Flow, MD   Chief Complaint  Patient presents with   Follow-up    History of Present Illness:    Cassandra Romero is a 89 y.o. female   with a history of paroxysmal atrial fibrillation, tachy-brady syndrome s/p [employer] PPM in 2010, atrial myxoma s/p resection in 2010 hypertension, and hyperlipidemia who was transferred from [facility] on 07/09/2021 for further management of atrial flutter with RVR and acute CHF.   Patient was recently seen by me on 06/2021 with dyspnea on exertion and weight gain. She was noted to be tachycardic at that visit and EKG showed atrial flutter with rates in the 120s.  She was started on Lasix and IV potassium supplement.  Echo on 06/27/2021 showed LVEF of 30-35%, mildly enlarged RV and moderately reduced RV systolic function, mild MR, and mild to moderate TR.     She then presented to to [facility] on 07/06/2021 for evaluation of worsening palpitations and shortness of breath. Troponin I negative. Pro BNP elevated at 1,650. CTA negative for PE  and any coronary calcification but did show a large left pleural effusion. WBC 4.9, Hgb 12.2, Plts 163. Na 136, K 3.2, Cr 0.70. She was started on IV Lasix, IV Cardizem, and IV Heparin. She developed hypotension with this and was reportedly started on Dobutamine intermittently. She also underwent a thoracentesis with removal of 400 mL of fluid. Decision was ultimately made to transfer her to [facility] for TEE/DCCV. Eventually she ended up having cardioversion done.  Then gradually we tried to put her on appropriate medications however difficulties blood pressure being low. She comes today to months for follow-up.  Overall she is doing very well.  She denies having chest pain tightness squeezing pressure burning chest.  We did review  her echocardiogram showed improvement left ventricle ejection fraction.  Past Medical History:  Diagnosis Date   AICD (automatic cardioverter/defibrillator) present 2010   [place]   Atrial fibrillation ([place]) 01/27/2009   Centricity Description: BRADYCARDIA Qualifier: Diagnosis of  By: Darrick Meigs   Centricity Description: ATRIAL ARRHYTHMIAS Qualifier: Diagnosis of  By: Darrick Meigs     Atrial myxoma    s/p resection   ATRIAL MYXOMA 01/27/2009   Qualifier: Diagnosis of  By: Darrick Meigs     Chronic kidney disease    kidney tumor   Congestive heart failure ([place]) 06/27/2021   Essential hypertension 07/20/2010   Qualifier: Diagnosis of  By: Rayann Heman, MD, James     Fibrocystic breast changes of both breasts 09/19/2015   GERD 01/27/2009   Qualifier: Diagnosis of  By: Darrick Meigs     Hypertension    Neoplasm of uncertain behavior of skin 04/15/2018   Neurofibroma of back 03/10/2019   OSTEOPENIA 01/27/2009   Qualifier: Diagnosis of  By: Darrick Meigs     Paroxysmal atrial fibrillation ([place])    Renal benign neoplasm, right 09/28/2020   Right renal mass    Seasonal allergies 09/10/2020   nonproductive cough   SICK SINUS/ TACHY-BRADY SYNDROME 01/27/2009   Qualifier: Diagnosis of  By: Darrick Meigs     Unspecified open wound, right lower leg, sequela 04/16/2016   URI (upper respiratory infection) 09/07/2020   WITH COLD AND COUGH , TOOK 5 DAYS OF ANTIBIOTICS ALL ISSUES RESOLVED  PER PT   Ver[place]cas 06/13/2020   Wears glasses    Wears hearing aid in both ears     Past Surgical History:  Procedure Laterality Date   [place]  12/24/2008   Left -- Dr Roxy Manns   BREAST SURGERY  1 ON RIGHT 2 ON LEFT   BIOPSY LAST DONE 1973, AND X 2 1960'S   CARDIOVERSION N/A 07/11/2021   Procedure: CARDIOVERSION;  Surgeon: Skeet Latch, MD;  Location: [place];  Service: Cardiovascular;  Laterality: N/A;   CHOLECYSTECTOMY  2010   LAPAPROSCIPIC    CYSTOSCOPY W/ URETERAL STENT PLACEMENT Right 09/27/2020   Procedure: CYSTOSCOPY WITH RETROGRADE PYELOGRAM/URETERAL STENT PLACEMENT;  Surgeon: Ceasar Mons, MD;  Location: [facility];  Service: Urology;  Laterality: Right;   IR RADIOLOGIST EVAL & MGMT  08/03/2020   IR RADIOLOGIST EVAL & MGMT  03/07/2021   IR RADIOLOGIST EVAL & MGMT  10/09/2021   PACEMAKER INSERTION  02/02/2009   PARTIAL HYSTERECTOMY  1973   STILL HAS OVARIES   RADIOLOGY WITH ANESTHESIA Right 09/28/2020   Procedure: IR WITH ANESTHESIA CT CRYOABLATION;  Surgeon: Criselda Peaches, MD;  Location: [facility];  Service: Anesthesiology;  Laterality: Right;   stapendectomy Bilateral 1970's   TEE WITHOUT CARDIOVERSION N/A 07/11/2021   Procedure: TRANSESOPHAGEAL ECHOCARDIOGRAM (TEE);  Surgeon: Skeet Latch, MD;  Location: [facility];  Service: Cardiovascular;  Laterality: N/A;    Current Medications: Current Meds  Medication Sig   acetaminophen (TYLENOL) 500 MG tablet Take 500 mg by mouth every 6 (six) hours as needed for mild pain.   apixaban (ELIQUIS) 2.5 MG TABS tablet Take 1 tablet (2.5 mg total) by mouth 2 (two) times daily.   dicyclomine (BENTYL) 10 MG capsule Take 10 mg by mouth 3 (three) times daily as needed.   famotidine (PEPCID) 20 MG tablet Take 20 mg by mouth 2 (two) times daily as needed for heartburn or indigestion.   furosemide (LASIX) 20 MG tablet Take 1 tablet (20 mg total) by mouth daily.   losartan (COZAAR) 25 MG tablet Take 1 tablet (25 mg total) by mouth 2 (two) times daily.   potassium chloride (KLOR-CON) 10 MEQ tablet Take 1 tablet (10 mEq total) by mouth daily.   sacubitril-valsartan (ENTRESTO) 24-26 MG Take 1 tablet by mouth 2 (two) times daily.     Allergies:   Aspirin, Alendronate sodium, Amiodarone hcl, Bactrim [sulfamethoxazole-trimethoprim], Ibandronate sodium, Risedronate sodium, Statins, Vimovo [naproxen-esomeprazole mg], Xarelto [rivaroxaban], Metaxalone, Mobic  [meloxicam], and Toprol xl [metoprolol]   Social History   Socioeconomic History   Marital status: Married    Spouse name: Not on file   Number of children: Not on file   Years of education: Not on file   Highest education level: Not on file  Occupational History   Occupation: retired Pharmacist, hospital  Tobacco Use   Smoking status: Former    Packs/day: 0.25    Years: 12.00    Total pack years: 3.00    Types: Cigarettes    Quit date: 06/04/1988    Years since quitting: 34.1   Smokeless tobacco: Never  Vaping Use   Vaping Use: Never used  Substance and Sexual Activity   Alcohol use: Not Currently   Drug use: No   Sexual activity: Not on file  Other Topics Concern   Not on file  Social History Narrative   Lives in [place]  Strain: Not on file  Food Insecurity: Not on file  Transportation Needs: Not on file  Physical Activity: Not on file  Stress: Not on file  Social Connections: Not on file     Family History: The patient's family history includes Coronary artery disease in an other family member; Dementia in her mother and another family member; Heart attack (age of onset: 33) in her father; Heart disease in her father; Pancreatic cancer in her mother and another family member; Thyroid disease in her mother; Transient ischemic attack in her mother. ROS:   Please see the history of present illness.    All 14 point review of systems negative except as described per history of present illness  EKGs/Labs/Other Studies Reviewed:      Recent Labs: 07/09/2021: ALT 17; B Natriuretic Peptide 133.5; Hemoglobin 12.0; Magnesium 1.8; Platelets 186 09/19/2021: BUN 17; Creatinine, Ser 0.76; Potassium 4.2; Sodium 142  Recent Lipid Panel    Component Value Date/Time   CHOL  12/22/2008 0150    146        ATP III CLASSIFICATION:  <200     mg/dL   Desirable  200-239  mg/dL   Borderline High  >=240    mg/dL   High          TRIG 83  12/22/2008 0150   HDL 36 (L) 12/22/2008 0150   CHOLHDL 4.1 12/22/2008 0150   VLDL 17 12/22/2008 0150   LDLCALC  12/22/2008 0150    93        Total Cholesterol/HDL:CHD Risk Coronary Heart Disease Risk Table                     Men   Women  1/2 Average Risk   3.4   3.3  Average Risk       5.0   4.4  2 X Average Risk   9.6   7.1  3 X Average Risk  23.4   11.0        Use the calculated Patient Ratio above and the CHD Risk Table to determine the patient's CHD Risk.        ATP III CLASSIFICATION (LDL):  <100     mg/dL   Optimal  100-129  mg/dL   Near or Above                    Optimal  130-159  mg/dL   Borderline  160-189  mg/dL   High  >190     mg/dL   Very High    Physical Exam:    VS:  BP 138/80 (BP Location: Left Arm, Patient Position: Sitting)   Pulse 76   Ht '5\' 5"'$  (1.651 m)   Wt 127 lb 9.6 oz (57.9 kg)   SpO2 97%   BMI 21.23 kg/m     Wt Readings from Last 3 Encounters:  07/06/22 127 lb 9.6 oz (57.9 kg)  03/20/22 126 lb 6.4 oz (57.3 kg)  02/12/22 121 lb 12.8 oz (55.2 kg)     GEN:  Well nourished, well developed in no acute distress HEENT: Normal NECK: No JVD; No carotid bruits LYMPHATICS: No lymphadenopathy CARDIAC: RRR, no murmurs, no rubs, no gallops RESPIRATORY:  Clear to auscultation without rales, wheezing or rhonchi  ABDOMEN: Soft, non-tender, non-distended MUSCULOSKELETAL:  No edema; No deformity  SKIN: Warm and dry LOWER EXTREMITIES: no swelling NEUROLOGIC:  Alert and oriented x 3 PSYCHIATRIC:  Normal affect   ASSESSMENT:  1. Medication management   2. Atrial myxoma s/p resection done more than 20 years ago   3. Dilated cardiomyopathy (HCC) ejection fraction 25 to 30% in 2023   4. Essential hypertension    PLAN:    In order of problems listed above:  Cardiomyopathy with significant improvement left ventricle ejection fraction.  I will stop her losartan and put her on Entresto, she was not able to tolerate Toprol XL. Status post atrial  myxoma resection done more than 20 years ago.  Doing well from that point review. Essential hypertension blood pressure well-controlled continue present management.   Medication Adjustments/Labs and Tests Ordered: Current medicines are reviewed at length with the patient today.  Concerns regarding medicines are outlined above.  Orders Placed This Encounter  Procedures   Basic metabolic panel   Medication changes:  Meds ordered this encounter  Medications   sacubitril-valsartan (ENTRESTO) 24-26 MG    Sig: Take 1 tablet by mouth 2 (two) times daily.    Dispense:  60 tablet    Refill:  1    Signed, Park Liter, MD, [facility] 07/06/2022 12:49 PM    [place]

## 2022-07-06 NOTE — Telephone Encounter (Signed)
Dispensed 2 bottles of Entresto 24-26 bid  Lot# /[id number] Exp:01/2024  Patient aware to stop Losartan

## 2022-07-13 ENCOUNTER — Other Ambulatory Visit: Payer: Self-pay

## 2022-07-13 DIAGNOSIS — Z79899 Other long term (current) drug therapy: Secondary | ICD-10-CM | POA: Diagnosis not present

## 2022-07-14 LAB — BASIC METABOLIC PANEL
BUN/Creatinine Ratio: 22 (ref 12–28)
BUN: 19 mg/dL (ref 8–27)
CO2: 22 mmol/L (ref 20–29)
Calcium: 9.5 mg/dL (ref 8.7–10.3)
Chloride: 102 mmol/L (ref 96–106)
Creatinine, Ser: 0.88 mg/dL (ref 0.57–1.00)
Glucose: 76 mg/dL (ref 70–99)
Potassium: 3.7 mmol/L (ref 3.5–5.2)
Sodium: 143 mmol/L (ref 134–144)
eGFR: 64 mL/min/{1.73_m2} (ref >59)

## 2022-07-19 ENCOUNTER — Telehealth: Payer: Self-pay

## 2022-07-19 NOTE — Telephone Encounter (Signed)
Results reviewed with pt as per Dr. Krasowski's note.  Pt verbalized understanding and had no additional questions. Routed to PCP  

## 2022-07-24 ENCOUNTER — Other Ambulatory Visit: Payer: Self-pay | Admitting: Cardiology

## 2022-07-26 NOTE — Progress Notes (Signed)
Remote pacemaker transmission.   

## 2022-07-31 ENCOUNTER — Telehealth: Payer: Self-pay

## 2022-07-31 NOTE — Patient Outreach (Signed)
  Care Coordination   07/31/2022 Name: Cassandra Romero MRN: [id number] DOB: 16-Apr-1933   Care Coordination Outreach Attempts:  An unsuccessful telephone outreach was attempted today to offer the patient information about available care coordination services as a benefit of their health plan.   Follow Up Plan:  Additional outreach attempts will be made to offer the patient care coordination information and services.   Encounter Outcome:  No Answer   Care Coordination Interventions:  No, not indicated    [name], RN, BSN, [facility] [employer] [phone]

## 2022-08-01 ENCOUNTER — Telehealth: Payer: Self-pay | Admitting: Cardiology

## 2022-08-01 ENCOUNTER — Telehealth: Payer: Self-pay

## 2022-08-01 NOTE — Telephone Encounter (Signed)
Spoke with pt. She stated that she has been feeling lightheaded. She admits to not eating or drinking well recently due to her husband being in the hospital and then to a nursing facility. She stated that she is going to take a dramamine and rest this evening to see if it helps. Encouraged to eat well, drink plenty of fluids and rest. Advised to call with any questions or concerns. Also encouraged to see PCP if needed. Pt agreed and verbalized understanding.

## 2022-08-01 NOTE — Patient Outreach (Signed)
  Care Coordination   08/01/2022 Name: Cassandra Romero MRN: [id number] DOB: Feb 13, 1933   Care Coordination Outreach Attempts:  A second unsuccessful outreach was attempted today to offer the patient with information about available care coordination services as a benefit of their health plan.     Follow Up Plan:  Additional outreach attempts will be made to offer the patient care coordination information and services.   Encounter Outcome:  No Answer   Care Coordination Interventions:  No, not indicated    [name], RN, BSN, [facility] [employer] [phone]

## 2022-08-01 NOTE — Telephone Encounter (Signed)
LVM

## 2022-08-01 NOTE — Telephone Encounter (Signed)
STAT if patient feels like he/she is going to faint   Are you dizzy now? No   Do you feel faint or have you passed out? No   Do you have any other symptoms? No   Have you checked your HR and BP (record if available)? 115/70; 78; 101/60; 79

## 2022-08-02 DIAGNOSIS — R42 Dizziness and giddiness: Secondary | ICD-10-CM | POA: Diagnosis not present

## 2022-08-02 DIAGNOSIS — Z6821 Body mass index (BMI) 21.0-21.9, adult: Secondary | ICD-10-CM | POA: Diagnosis not present

## 2022-08-02 DIAGNOSIS — I5022 Chronic systolic (congestive) heart failure: Secondary | ICD-10-CM | POA: Diagnosis not present

## 2022-08-03 ENCOUNTER — Telehealth: Payer: Self-pay

## 2022-08-03 NOTE — Patient Outreach (Signed)
  Care Coordination   08/03/2022 Name: Cassandra Romero MRN: [id number] DOB: 1933-04-08   Care Coordination Outreach Attempts:  A third unsuccessful outreach was attempted today to offer the patient with information about available care coordination services as a benefit of their health plan.   Follow Up Plan:  No further outreach attempts will be made at this time. We have been unable to contact the patient to offer or enroll patient in care coordination services  Encounter Outcome:  No Answer   Care Coordination Interventions:  No, not indicated    [name], RN, BSN, [facility] [place] Coordinator [phone]

## 2022-09-03 ENCOUNTER — Ambulatory Visit: Payer: Medicare PPO | Admitting: Podiatry

## 2022-09-03 DIAGNOSIS — G620 Drug-induced polyneuropathy: Secondary | ICD-10-CM | POA: Diagnosis not present

## 2022-09-03 NOTE — Progress Notes (Signed)
Subjective:  Patient ID: Cassandra Romero, female    DOB: 1932/06/26,  MRN: [id number]  Chief Complaint  Patient presents with   Numbness    Patient came in today for bilateral numbness toes, started a year ago since the patient was put on heart medication, burning, tingling, numbness, sharp shooting pain in the hallux at night,  left foot callus between the hallux 2nd toe    89 y.o. female presents with concern for possible neuropathy in bilateral forefoot.  She says about a year ago she started taking Entresto and Eliquis she started to have more numbness burning and tingling in the toes.  She has occasional electric shooting pains towards the left and right great toe.  She does not take any nerve pain medication.  Past Medical History:  Diagnosis Date   AICD (automatic cardioverter/defibrillator) present 2010   [place]   Atrial fibrillation ([place]) 01/27/2009   Centricity Description: BRADYCARDIA Qualifier: Diagnosis of  By: Darrick Meigs   Centricity Description: ATRIAL ARRHYTHMIAS Qualifier: Diagnosis of  By: Darrick Meigs     Atrial myxoma    s/p resection   ATRIAL MYXOMA 01/27/2009   Qualifier: Diagnosis of  By: Darrick Meigs     Chronic kidney disease    kidney tumor   Congestive heart failure ([place]) 06/27/2021   Essential hypertension 07/20/2010   Qualifier: Diagnosis of  By: Rayann Heman, MD, James     Fibrocystic breast changes of both breasts 09/19/2015   GERD 01/27/2009   Qualifier: Diagnosis of  By: Darrick Meigs     Hypertension    Neoplasm of uncertain behavior of skin 04/15/2018   Neurofibroma of back 03/10/2019   OSTEOPENIA 01/27/2009   Qualifier: Diagnosis of  By: Darrick Meigs     Paroxysmal atrial fibrillation [facility])    Renal benign neoplasm, right 09/28/2020   Right renal mass    Seasonal allergies 09/10/2020   nonproductive cough   SICK SINUS/ TACHY-BRADY SYNDROME 01/27/2009   Qualifier: Diagnosis of  By: Darrick Meigs     Unspecified open wound, right lower  leg, sequela 04/16/2016   URI (upper respiratory infection) 09/07/2020   WITH COLD AND COUGH , TOOK 5 DAYS OF ANTIBIOTICS ALL ISSUES RESOLVED PER PT   [place] 06/13/2020   Wears glasses    Wears hearing aid in both ears     Allergies  Allergen Reactions   Aspirin     Makes her feel sick   Alendronate Sodium     REACTION: extreme fatigue/vision change   Amiodarone Hcl Nausea And Vomiting   Bactrim [Sulfamethoxazole-Trimethoprim] Other (See Comments)    Abdominal pain   Ibandronate Sodium     REACTION: extreme fatigue/vision changes   Risedronate Sodium     REACTION: extreme fatigue/vision   Statins Other (See Comments)    Myalgia   Vimovo [Naproxen-Esomeprazole Mg] Other (See Comments)    Leg Cramps   Xarelto [Rivaroxaban]     SEVERE ANEMIA   Metaxalone Rash   Mobic [Meloxicam] Rash   Toprol Xl [Metoprolol] Other (See Comments)    Acute acid reflux    ROS: Negative except as per HPI above  Objective:  General: AAO x3, NAD  Dermatological: With inspection and palpation of the right and left lower extremities there are no open sores, no preulcerative lesions, no rash or signs of infection present. Nails are of normal length thickness and coloration.   Vascular:  Dorsalis Pedis artery and Posterior Tibial artery pedal pulses are 2/4 bilateral.  Capillary fill  time < 3 sec to all digits.   Neruologic: Grossly intact via light touch bilateral. Protective threshold intact to all sites bilateral.  Subjective sensation of burning in the toes with occasional shooting electric pains  Musculoskeletal: No gross boney pedal deformities bilateral. No pain, crepitus, or limitation noted with foot and ankle range of motion bilateral. Muscular strength 5/5 in all groups tested bilateral.  Gait: Unassisted, Nonantalgic.   No images are attached to the encounter.   Assessment:   1. Neuropathy due to drug      Plan:  Patient was evaluated and treated and all questions  answered.  # Neuropathy related to Entresto and Eliquis -Discussed with patient sounds like she has drug-induced neuropathy as she started to have the symptoms consistent with neuropathy after began taking his medications -Discussed possible use of gabapentin as well as related side effects.  Discussed use of 100 mg once daily.  Patient would like to defer as she is now more medication at this time -Recommend use of topical nerve medication such as nervive -Recommend patient follow-up as needed if the neuropathic pain gets worse  Return in about 3 months (around 12/03/2022), or if symptoms worsen or fail to improve.          Everitt Amber, DPM [facility]

## 2022-09-11 ENCOUNTER — Other Ambulatory Visit: Payer: Self-pay | Admitting: Interventional Radiology

## 2022-09-11 DIAGNOSIS — N2889 Other specified disorders of kidney and ureter: Secondary | ICD-10-CM

## 2022-09-13 ENCOUNTER — Ambulatory Visit: Payer: Medicare PPO | Admitting: Cardiology

## 2022-09-13 ENCOUNTER — Other Ambulatory Visit: Payer: Self-pay | Admitting: Interventional Radiology

## 2022-09-13 DIAGNOSIS — N2889 Other specified disorders of kidney and ureter: Secondary | ICD-10-CM

## 2022-09-18 ENCOUNTER — Other Ambulatory Visit: Payer: Self-pay | Admitting: Cardiology

## 2022-09-25 DIAGNOSIS — D225 Melanocytic nevi of trunk: Secondary | ICD-10-CM | POA: Diagnosis not present

## 2022-09-25 DIAGNOSIS — D485 Neoplasm of uncertain behavior of skin: Secondary | ICD-10-CM | POA: Diagnosis not present

## 2022-09-25 DIAGNOSIS — L821 Other seborrheic keratosis: Secondary | ICD-10-CM | POA: Diagnosis not present

## 2022-09-25 DIAGNOSIS — I781 Nevus, non-neoplastic: Secondary | ICD-10-CM | POA: Diagnosis not present

## 2022-10-02 ENCOUNTER — Telehealth: Payer: Self-pay | Admitting: Cardiology

## 2022-10-02 DIAGNOSIS — Z1339 Encounter for screening examination for other mental health and behavioral disorders: Secondary | ICD-10-CM | POA: Diagnosis not present

## 2022-10-02 DIAGNOSIS — K219 Gastro-esophageal reflux disease without esophagitis: Secondary | ICD-10-CM | POA: Diagnosis not present

## 2022-10-02 DIAGNOSIS — I429 Cardiomyopathy, unspecified: Secondary | ICD-10-CM | POA: Diagnosis not present

## 2022-10-02 DIAGNOSIS — Z0000 Encounter for general adult medical examination without abnormal findings: Secondary | ICD-10-CM | POA: Diagnosis not present

## 2022-10-02 DIAGNOSIS — Z1331 Encounter for screening for depression: Secondary | ICD-10-CM | POA: Diagnosis not present

## 2022-10-02 DIAGNOSIS — Z9181 History of falling: Secondary | ICD-10-CM | POA: Diagnosis not present

## 2022-10-02 NOTE — Telephone Encounter (Signed)
Spoke with pt who states that since being on Entresto she has had increased acid reflux. Pt states she is has 2 days left of the medication and no refills. Pt would like to know if this can be changed to another medication as her reflux kept her up all night.  Procedure: 2D Echo, Cardiac Doppler, Color Doppler and Strain Analysis   ECHO 04/04/22  IMPRESSIONS     1. GLS -15.7. Left ventricular ejection fraction, by estimation, is 50 to  55%. The left ventricle has low normal function. The left ventricle has no  regional wall motion abnormalities. Left ventricular diastolic parameters  were normal.   2. Right ventricular systolic function is normal. The right ventricular  size is normal. There is normal pulmonary artery systolic pressure.   3. The mitral valve is normal in structure. No evidence of mitral valve  regurgitation. No evidence of mitral stenosis.   4. Tricuspid valve regurgitation is moderate.   5. The aortic valve is normal in structure. Aortic valve regurgitation is  not visualized. Aortic valve sclerosis/calcification is present, without  any evidence of aortic stenosis.   6. The inferior vena cava is normal in size with greater than 50%  respiratory variability, suggesting right atrial pressure of 3 mmHg.

## 2022-10-02 NOTE — Telephone Encounter (Signed)
Pt c/o medication issue:  1. Name of Medication:  sacubitril-valsartan (ENTRESTO) 24-26 MG   2. How are you currently taking this medication (dosage and times per day)?   As prescribed  3. Are you having a reaction (difficulty breathing--STAT)?   Really bad acid reflux  4. What is your medication issue?   Patient stated she only has 2 days left on this medication and wants to know if she should still be taking this medication.

## 2022-10-03 ENCOUNTER — Other Ambulatory Visit: Payer: Self-pay

## 2022-10-03 ENCOUNTER — Ambulatory Visit: Payer: Medicare PPO

## 2022-10-03 MED ORDER — ENTRESTO 24-26 MG PO TABS: 1.0000 | ORAL_TABLET | Freq: Two times a day (BID) | ORAL | 10 refills | Status: DC

## 2022-10-03 NOTE — Telephone Encounter (Signed)
Entresto 24-26 #60 ref x 10 sent to [facility]

## 2022-10-03 NOTE — Telephone Encounter (Signed)
Patient called to find out if her [name] is going to be refilled or changed.  She states she only has one pill left.  Please advise.

## 2022-10-12 DIAGNOSIS — I700 Atherosclerosis of aorta: Secondary | ICD-10-CM | POA: Diagnosis not present

## 2022-10-12 DIAGNOSIS — N2889 Other specified disorders of kidney and ureter: Secondary | ICD-10-CM | POA: Diagnosis not present

## 2022-10-16 DIAGNOSIS — D045 Carcinoma in situ of skin of trunk: Secondary | ICD-10-CM | POA: Diagnosis not present

## 2022-10-21 DIAGNOSIS — K5900 Constipation, unspecified: Secondary | ICD-10-CM | POA: Diagnosis not present

## 2022-10-21 DIAGNOSIS — R58 Hemorrhage, not elsewhere classified: Secondary | ICD-10-CM | POA: Diagnosis not present

## 2022-10-22 DIAGNOSIS — Z7401 Bed confinement status: Secondary | ICD-10-CM | POA: Diagnosis not present

## 2022-10-22 DIAGNOSIS — S0990XA Unspecified injury of head, initial encounter: Secondary | ICD-10-CM | POA: Diagnosis not present

## 2022-10-22 DIAGNOSIS — M25562 Pain in left knee: Secondary | ICD-10-CM | POA: Diagnosis not present

## 2022-10-22 DIAGNOSIS — R0902 Hypoxemia: Secondary | ICD-10-CM | POA: Diagnosis not present

## 2022-10-22 DIAGNOSIS — I959 Hypotension, unspecified: Secondary | ICD-10-CM | POA: Diagnosis not present

## 2022-10-22 DIAGNOSIS — W19XXXA Unspecified fall, initial encounter: Secondary | ICD-10-CM | POA: Diagnosis not present

## 2022-10-22 DIAGNOSIS — K649 Unspecified hemorrhoids: Secondary | ICD-10-CM | POA: Diagnosis not present

## 2022-10-22 DIAGNOSIS — R079 Chest pain, unspecified: Secondary | ICD-10-CM | POA: Diagnosis not present

## 2022-10-22 DIAGNOSIS — D219 Benign neoplasm of connective and other soft tissue, unspecified: Secondary | ICD-10-CM | POA: Diagnosis not present

## 2022-10-22 DIAGNOSIS — M810 Age-related osteoporosis without current pathological fracture: Secondary | ICD-10-CM | POA: Diagnosis not present

## 2022-10-22 DIAGNOSIS — I2510 Atherosclerotic heart disease of native coronary artery without angina pectoris: Secondary | ICD-10-CM | POA: Diagnosis not present

## 2022-10-22 DIAGNOSIS — I429 Cardiomyopathy, unspecified: Secondary | ICD-10-CM | POA: Diagnosis not present

## 2022-10-22 DIAGNOSIS — I495 Sick sinus syndrome: Secondary | ICD-10-CM | POA: Diagnosis not present

## 2022-10-22 DIAGNOSIS — S72002A Fracture of unspecified part of neck of left femur, initial encounter for closed fracture: Secondary | ICD-10-CM | POA: Diagnosis not present

## 2022-10-22 DIAGNOSIS — K5900 Constipation, unspecified: Secondary | ICD-10-CM | POA: Diagnosis not present

## 2022-10-22 DIAGNOSIS — S72142A Displaced intertrochanteric fracture of left femur, initial encounter for closed fracture: Secondary | ICD-10-CM | POA: Diagnosis not present

## 2022-10-22 DIAGNOSIS — M25552 Pain in left hip: Secondary | ICD-10-CM | POA: Diagnosis not present

## 2022-10-22 DIAGNOSIS — I480 Paroxysmal atrial fibrillation: Secondary | ICD-10-CM | POA: Diagnosis not present

## 2022-10-22 DIAGNOSIS — I5022 Chronic systolic (congestive) heart failure: Secondary | ICD-10-CM | POA: Diagnosis not present

## 2022-10-22 DIAGNOSIS — E559 Vitamin D deficiency, unspecified: Secondary | ICD-10-CM | POA: Diagnosis not present

## 2022-10-22 DIAGNOSIS — I509 Heart failure, unspecified: Secondary | ICD-10-CM | POA: Diagnosis not present

## 2022-10-22 DIAGNOSIS — S72009A Fracture of unspecified part of neck of unspecified femur, initial encounter for closed fracture: Secondary | ICD-10-CM | POA: Diagnosis not present

## 2022-10-22 DIAGNOSIS — M8580 Other specified disorders of bone density and structure, unspecified site: Secondary | ICD-10-CM | POA: Diagnosis not present

## 2022-10-22 DIAGNOSIS — K5901 Slow transit constipation: Secondary | ICD-10-CM | POA: Diagnosis not present

## 2022-10-22 DIAGNOSIS — S199XXA Unspecified injury of neck, initial encounter: Secondary | ICD-10-CM | POA: Diagnosis not present

## 2022-10-22 DIAGNOSIS — I5032 Chronic diastolic (congestive) heart failure: Secondary | ICD-10-CM | POA: Diagnosis not present

## 2022-10-23 ENCOUNTER — Ambulatory Visit (INDEPENDENT_AMBULATORY_CARE_PROVIDER_SITE_OTHER): Payer: Medicare PPO

## 2022-10-23 ENCOUNTER — Ambulatory Visit
Admission: RE | Admit: 2022-10-23 | Discharge: 2022-10-23 | Disposition: A | Discharge status: Home / Self Care | Payer: Medicare PPO | Source: Ambulatory Visit | Attending: Interventional Radiology | Admitting: Interventional Radiology

## 2022-10-23 DIAGNOSIS — I495 Sick sinus syndrome: Secondary | ICD-10-CM

## 2022-10-23 DIAGNOSIS — N2889 Other specified disorders of kidney and ureter: Secondary | ICD-10-CM

## 2022-10-23 LAB — CUP PACEART REMOTE DEVICE CHECK
Battery Impedance: 2867 Ohm
Battery Remaining Longevity: 24 mo
Battery Voltage: 2.73 V
Brady Statistic AP VP Percent: 0 %
Brady Statistic AP VS Percent: 44 %
Brady Statistic AS VP Percent: 0 %
Brady Statistic AS VS Percent: 56 %
Date Time Interrogation Session: 20240521012211
Implantable Lead Connection Status: 753985
Implantable Lead Connection Status: 753985
Implantable Lead Implant Date: 20100729
Implantable Lead Implant Date: 20100729
Implantable Lead Location: 753859
Implantable Lead Location: 753860
Implantable Lead Model: 5076
Implantable Lead Model: 5076
Implantable Pulse Generator Implant Date: 20100729
Lead Channel Impedance Value: 442 Ohm
Lead Channel Impedance Value: 536 Ohm
Lead Channel Pacing Threshold Amplitude: 0.625 V
Lead Channel Pacing Threshold Amplitude: 0.75 V
Lead Channel Pacing Threshold Pulse Width: 0.4 ms
Lead Channel Pacing Threshold Pulse Width: 0.4 ms
Lead Channel Setting Pacing Amplitude: 2 V
Lead Channel Setting Pacing Amplitude: 2.5 V
Lead Channel Setting Pacing Pulse Width: 0.4 ms
Lead Channel Setting Sensing Sensitivity: 2.8 mV
Zone Setting Status: 755011
Zone Setting Status: 755011

## 2022-10-25 DIAGNOSIS — R109 Unspecified abdominal pain: Secondary | ICD-10-CM | POA: Insufficient documentation

## 2022-10-25 DIAGNOSIS — M25559 Pain in unspecified hip: Secondary | ICD-10-CM | POA: Insufficient documentation

## 2022-10-25 DIAGNOSIS — W19XXXA Unspecified fall, initial encounter: Secondary | ICD-10-CM | POA: Insufficient documentation

## 2022-10-25 DIAGNOSIS — M6281 Muscle weakness (generalized): Secondary | ICD-10-CM | POA: Insufficient documentation

## 2022-10-25 DIAGNOSIS — M810 Age-related osteoporosis without current pathological fracture: Secondary | ICD-10-CM | POA: Insufficient documentation

## 2022-10-25 DIAGNOSIS — I5022 Chronic systolic (congestive) heart failure: Secondary | ICD-10-CM | POA: Diagnosis not present

## 2022-10-25 DIAGNOSIS — S72009A Fracture of unspecified part of neck of unspecified femur, initial encounter for closed fracture: Secondary | ICD-10-CM | POA: Diagnosis not present

## 2022-10-25 DIAGNOSIS — Z7401 Bed confinement status: Secondary | ICD-10-CM | POA: Diagnosis not present

## 2022-10-25 DIAGNOSIS — K649 Unspecified hemorrhoids: Secondary | ICD-10-CM | POA: Insufficient documentation

## 2022-10-25 DIAGNOSIS — I4811 Longstanding persistent atrial fibrillation: Secondary | ICD-10-CM | POA: Diagnosis not present

## 2022-10-25 DIAGNOSIS — S72143A Displaced intertrochanteric fracture of unspecified femur, initial encounter for closed fracture: Secondary | ICD-10-CM | POA: Insufficient documentation

## 2022-10-25 DIAGNOSIS — J209 Acute bronchitis, unspecified: Secondary | ICD-10-CM | POA: Diagnosis not present

## 2022-10-25 DIAGNOSIS — I2699 Other pulmonary embolism without acute cor pulmonale: Secondary | ICD-10-CM | POA: Insufficient documentation

## 2022-10-25 DIAGNOSIS — Z7901 Long term (current) use of anticoagulants: Secondary | ICD-10-CM | POA: Insufficient documentation

## 2022-10-25 DIAGNOSIS — R279 Unspecified lack of coordination: Secondary | ICD-10-CM | POA: Insufficient documentation

## 2022-10-25 DIAGNOSIS — E559 Vitamin D deficiency, unspecified: Secondary | ICD-10-CM | POA: Insufficient documentation

## 2022-10-25 DIAGNOSIS — K279 Peptic ulcer, site unspecified, unspecified as acute or chronic, without hemorrhage or perforation: Secondary | ICD-10-CM | POA: Insufficient documentation

## 2022-10-25 DIAGNOSIS — H21329 Implantation cysts of iris, ciliary body or anterior chamber, unspecified eye: Secondary | ICD-10-CM | POA: Insufficient documentation

## 2022-10-25 DIAGNOSIS — R609 Edema, unspecified: Secondary | ICD-10-CM | POA: Insufficient documentation

## 2022-10-25 DIAGNOSIS — I429 Cardiomyopathy, unspecified: Secondary | ICD-10-CM | POA: Diagnosis not present

## 2022-10-25 DIAGNOSIS — S72142A Displaced intertrochanteric fracture of left femur, initial encounter for closed fracture: Secondary | ICD-10-CM | POA: Diagnosis not present

## 2022-10-26 DIAGNOSIS — I4811 Longstanding persistent atrial fibrillation: Secondary | ICD-10-CM | POA: Diagnosis not present

## 2022-10-26 DIAGNOSIS — J209 Acute bronchitis, unspecified: Secondary | ICD-10-CM | POA: Diagnosis not present

## 2022-11-07 DIAGNOSIS — S72142A Displaced intertrochanteric fracture of left femur, initial encounter for closed fracture: Secondary | ICD-10-CM | POA: Diagnosis not present

## 2022-11-10 DIAGNOSIS — I4811 Longstanding persistent atrial fibrillation: Secondary | ICD-10-CM | POA: Diagnosis not present

## 2022-11-10 DIAGNOSIS — D6869 Other thrombophilia: Secondary | ICD-10-CM | POA: Diagnosis not present

## 2022-11-16 NOTE — Progress Notes (Signed)
Remote pacemaker transmission.   

## 2022-11-17 DIAGNOSIS — I428 Other cardiomyopathies: Secondary | ICD-10-CM | POA: Diagnosis not present

## 2022-11-17 DIAGNOSIS — K219 Gastro-esophageal reflux disease without esophagitis: Secondary | ICD-10-CM | POA: Diagnosis not present

## 2022-11-17 DIAGNOSIS — I480 Paroxysmal atrial fibrillation: Secondary | ICD-10-CM | POA: Diagnosis not present

## 2022-11-17 DIAGNOSIS — I5022 Chronic systolic (congestive) heart failure: Secondary | ICD-10-CM | POA: Diagnosis not present

## 2022-11-17 DIAGNOSIS — M1612 Unilateral primary osteoarthritis, left hip: Secondary | ICD-10-CM | POA: Diagnosis not present

## 2022-11-17 DIAGNOSIS — M80052D Age-related osteoporosis with current pathological fracture, left femur, subsequent encounter for fracture with routine healing: Secondary | ICD-10-CM | POA: Diagnosis not present

## 2022-11-17 DIAGNOSIS — M85852 Other specified disorders of bone density and structure, left thigh: Secondary | ICD-10-CM | POA: Diagnosis not present

## 2022-11-17 DIAGNOSIS — I4510 Unspecified right bundle-branch block: Secondary | ICD-10-CM | POA: Diagnosis not present

## 2022-11-17 DIAGNOSIS — K649 Unspecified hemorrhoids: Secondary | ICD-10-CM | POA: Diagnosis not present

## 2022-11-19 DIAGNOSIS — I480 Paroxysmal atrial fibrillation: Secondary | ICD-10-CM | POA: Diagnosis not present

## 2022-11-19 DIAGNOSIS — I428 Other cardiomyopathies: Secondary | ICD-10-CM | POA: Diagnosis not present

## 2022-11-19 DIAGNOSIS — I5022 Chronic systolic (congestive) heart failure: Secondary | ICD-10-CM | POA: Diagnosis not present

## 2022-11-19 DIAGNOSIS — I4510 Unspecified right bundle-branch block: Secondary | ICD-10-CM | POA: Diagnosis not present

## 2022-11-19 DIAGNOSIS — K649 Unspecified hemorrhoids: Secondary | ICD-10-CM | POA: Diagnosis not present

## 2022-11-19 DIAGNOSIS — M85852 Other specified disorders of bone density and structure, left thigh: Secondary | ICD-10-CM | POA: Diagnosis not present

## 2022-11-19 DIAGNOSIS — M1612 Unilateral primary osteoarthritis, left hip: Secondary | ICD-10-CM | POA: Diagnosis not present

## 2022-11-19 DIAGNOSIS — K219 Gastro-esophageal reflux disease without esophagitis: Secondary | ICD-10-CM | POA: Diagnosis not present

## 2022-11-19 DIAGNOSIS — M80052D Age-related osteoporosis with current pathological fracture, left femur, subsequent encounter for fracture with routine healing: Secondary | ICD-10-CM | POA: Diagnosis not present

## 2022-11-20 DIAGNOSIS — M85852 Other specified disorders of bone density and structure, left thigh: Secondary | ICD-10-CM | POA: Diagnosis not present

## 2022-11-20 DIAGNOSIS — K649 Unspecified hemorrhoids: Secondary | ICD-10-CM | POA: Diagnosis not present

## 2022-11-20 DIAGNOSIS — K219 Gastro-esophageal reflux disease without esophagitis: Secondary | ICD-10-CM | POA: Diagnosis not present

## 2022-11-20 DIAGNOSIS — I4510 Unspecified right bundle-branch block: Secondary | ICD-10-CM | POA: Diagnosis not present

## 2022-11-20 DIAGNOSIS — I5022 Chronic systolic (congestive) heart failure: Secondary | ICD-10-CM | POA: Diagnosis not present

## 2022-11-20 DIAGNOSIS — I480 Paroxysmal atrial fibrillation: Secondary | ICD-10-CM | POA: Diagnosis not present

## 2022-11-20 DIAGNOSIS — M1612 Unilateral primary osteoarthritis, left hip: Secondary | ICD-10-CM | POA: Diagnosis not present

## 2022-11-20 DIAGNOSIS — I428 Other cardiomyopathies: Secondary | ICD-10-CM | POA: Diagnosis not present

## 2022-11-20 DIAGNOSIS — M80052D Age-related osteoporosis with current pathological fracture, left femur, subsequent encounter for fracture with routine healing: Secondary | ICD-10-CM | POA: Diagnosis not present

## 2022-11-22 DIAGNOSIS — M1612 Unilateral primary osteoarthritis, left hip: Secondary | ICD-10-CM | POA: Diagnosis not present

## 2022-11-22 DIAGNOSIS — M85852 Other specified disorders of bone density and structure, left thigh: Secondary | ICD-10-CM | POA: Diagnosis not present

## 2022-11-22 DIAGNOSIS — I480 Paroxysmal atrial fibrillation: Secondary | ICD-10-CM | POA: Diagnosis not present

## 2022-11-22 DIAGNOSIS — I428 Other cardiomyopathies: Secondary | ICD-10-CM | POA: Diagnosis not present

## 2022-11-22 DIAGNOSIS — I4510 Unspecified right bundle-branch block: Secondary | ICD-10-CM | POA: Diagnosis not present

## 2022-11-22 DIAGNOSIS — I5022 Chronic systolic (congestive) heart failure: Secondary | ICD-10-CM | POA: Diagnosis not present

## 2022-11-22 DIAGNOSIS — M80052D Age-related osteoporosis with current pathological fracture, left femur, subsequent encounter for fracture with routine healing: Secondary | ICD-10-CM | POA: Diagnosis not present

## 2022-11-22 DIAGNOSIS — K649 Unspecified hemorrhoids: Secondary | ICD-10-CM | POA: Diagnosis not present

## 2022-11-22 DIAGNOSIS — K219 Gastro-esophageal reflux disease without esophagitis: Secondary | ICD-10-CM | POA: Diagnosis not present

## 2022-11-23 DIAGNOSIS — K649 Unspecified hemorrhoids: Secondary | ICD-10-CM | POA: Diagnosis not present

## 2022-11-23 DIAGNOSIS — K219 Gastro-esophageal reflux disease without esophagitis: Secondary | ICD-10-CM | POA: Diagnosis not present

## 2022-11-23 DIAGNOSIS — I4510 Unspecified right bundle-branch block: Secondary | ICD-10-CM | POA: Diagnosis not present

## 2022-11-23 DIAGNOSIS — I480 Paroxysmal atrial fibrillation: Secondary | ICD-10-CM | POA: Diagnosis not present

## 2022-11-23 DIAGNOSIS — M80052D Age-related osteoporosis with current pathological fracture, left femur, subsequent encounter for fracture with routine healing: Secondary | ICD-10-CM | POA: Diagnosis not present

## 2022-11-23 DIAGNOSIS — M85852 Other specified disorders of bone density and structure, left thigh: Secondary | ICD-10-CM | POA: Diagnosis not present

## 2022-11-23 DIAGNOSIS — M1612 Unilateral primary osteoarthritis, left hip: Secondary | ICD-10-CM | POA: Diagnosis not present

## 2022-11-23 DIAGNOSIS — I428 Other cardiomyopathies: Secondary | ICD-10-CM | POA: Diagnosis not present

## 2022-11-23 DIAGNOSIS — I5022 Chronic systolic (congestive) heart failure: Secondary | ICD-10-CM | POA: Diagnosis not present

## 2022-11-26 DIAGNOSIS — M1612 Unilateral primary osteoarthritis, left hip: Secondary | ICD-10-CM | POA: Diagnosis not present

## 2022-11-26 DIAGNOSIS — I5022 Chronic systolic (congestive) heart failure: Secondary | ICD-10-CM | POA: Diagnosis not present

## 2022-11-26 DIAGNOSIS — I480 Paroxysmal atrial fibrillation: Secondary | ICD-10-CM | POA: Diagnosis not present

## 2022-11-26 DIAGNOSIS — K219 Gastro-esophageal reflux disease without esophagitis: Secondary | ICD-10-CM | POA: Diagnosis not present

## 2022-11-26 DIAGNOSIS — M85852 Other specified disorders of bone density and structure, left thigh: Secondary | ICD-10-CM | POA: Diagnosis not present

## 2022-11-26 DIAGNOSIS — I4510 Unspecified right bundle-branch block: Secondary | ICD-10-CM | POA: Diagnosis not present

## 2022-11-26 DIAGNOSIS — M80052D Age-related osteoporosis with current pathological fracture, left femur, subsequent encounter for fracture with routine healing: Secondary | ICD-10-CM | POA: Diagnosis not present

## 2022-11-26 DIAGNOSIS — I428 Other cardiomyopathies: Secondary | ICD-10-CM | POA: Diagnosis not present

## 2022-11-26 DIAGNOSIS — K649 Unspecified hemorrhoids: Secondary | ICD-10-CM | POA: Diagnosis not present

## 2022-11-27 DIAGNOSIS — M4850XA Collapsed vertebra, not elsewhere classified, site unspecified, initial encounter for fracture: Secondary | ICD-10-CM | POA: Diagnosis not present

## 2022-11-27 DIAGNOSIS — I429 Cardiomyopathy, unspecified: Secondary | ICD-10-CM | POA: Diagnosis not present

## 2022-11-27 DIAGNOSIS — C641 Malignant neoplasm of right kidney, except renal pelvis: Secondary | ICD-10-CM | POA: Diagnosis not present

## 2022-11-27 DIAGNOSIS — S72142K Displaced intertrochanteric fracture of left femur, subsequent encounter for closed fracture with nonunion: Secondary | ICD-10-CM | POA: Diagnosis not present

## 2022-11-27 DIAGNOSIS — Z681 Body mass index (BMI) 19 or less, adult: Secondary | ICD-10-CM | POA: Diagnosis not present

## 2022-11-30 DIAGNOSIS — M1612 Unilateral primary osteoarthritis, left hip: Secondary | ICD-10-CM | POA: Diagnosis not present

## 2022-11-30 DIAGNOSIS — K219 Gastro-esophageal reflux disease without esophagitis: Secondary | ICD-10-CM | POA: Diagnosis not present

## 2022-11-30 DIAGNOSIS — M85852 Other specified disorders of bone density and structure, left thigh: Secondary | ICD-10-CM | POA: Diagnosis not present

## 2022-11-30 DIAGNOSIS — M80052D Age-related osteoporosis with current pathological fracture, left femur, subsequent encounter for fracture with routine healing: Secondary | ICD-10-CM | POA: Diagnosis not present

## 2022-11-30 DIAGNOSIS — I4510 Unspecified right bundle-branch block: Secondary | ICD-10-CM | POA: Diagnosis not present

## 2022-11-30 DIAGNOSIS — K649 Unspecified hemorrhoids: Secondary | ICD-10-CM | POA: Diagnosis not present

## 2022-11-30 DIAGNOSIS — I480 Paroxysmal atrial fibrillation: Secondary | ICD-10-CM | POA: Diagnosis not present

## 2022-11-30 DIAGNOSIS — I428 Other cardiomyopathies: Secondary | ICD-10-CM | POA: Diagnosis not present

## 2022-11-30 DIAGNOSIS — I5022 Chronic systolic (congestive) heart failure: Secondary | ICD-10-CM | POA: Diagnosis not present

## 2022-12-03 DIAGNOSIS — I5022 Chronic systolic (congestive) heart failure: Secondary | ICD-10-CM | POA: Diagnosis not present

## 2022-12-03 DIAGNOSIS — K649 Unspecified hemorrhoids: Secondary | ICD-10-CM | POA: Diagnosis not present

## 2022-12-03 DIAGNOSIS — I428 Other cardiomyopathies: Secondary | ICD-10-CM | POA: Diagnosis not present

## 2022-12-03 DIAGNOSIS — M80052D Age-related osteoporosis with current pathological fracture, left femur, subsequent encounter for fracture with routine healing: Secondary | ICD-10-CM | POA: Diagnosis not present

## 2022-12-03 DIAGNOSIS — K219 Gastro-esophageal reflux disease without esophagitis: Secondary | ICD-10-CM | POA: Diagnosis not present

## 2022-12-03 DIAGNOSIS — M1612 Unilateral primary osteoarthritis, left hip: Secondary | ICD-10-CM | POA: Diagnosis not present

## 2022-12-03 DIAGNOSIS — I4510 Unspecified right bundle-branch block: Secondary | ICD-10-CM | POA: Diagnosis not present

## 2022-12-03 DIAGNOSIS — I480 Paroxysmal atrial fibrillation: Secondary | ICD-10-CM | POA: Diagnosis not present

## 2022-12-03 DIAGNOSIS — M85852 Other specified disorders of bone density and structure, left thigh: Secondary | ICD-10-CM | POA: Diagnosis not present

## 2022-12-07 DIAGNOSIS — M80052D Age-related osteoporosis with current pathological fracture, left femur, subsequent encounter for fracture with routine healing: Secondary | ICD-10-CM | POA: Diagnosis not present

## 2022-12-07 DIAGNOSIS — K219 Gastro-esophageal reflux disease without esophagitis: Secondary | ICD-10-CM | POA: Diagnosis not present

## 2022-12-07 DIAGNOSIS — I5022 Chronic systolic (congestive) heart failure: Secondary | ICD-10-CM | POA: Diagnosis not present

## 2022-12-07 DIAGNOSIS — I428 Other cardiomyopathies: Secondary | ICD-10-CM | POA: Diagnosis not present

## 2022-12-07 DIAGNOSIS — M1612 Unilateral primary osteoarthritis, left hip: Secondary | ICD-10-CM | POA: Diagnosis not present

## 2022-12-07 DIAGNOSIS — M85852 Other specified disorders of bone density and structure, left thigh: Secondary | ICD-10-CM | POA: Diagnosis not present

## 2022-12-07 DIAGNOSIS — I4510 Unspecified right bundle-branch block: Secondary | ICD-10-CM | POA: Diagnosis not present

## 2022-12-07 DIAGNOSIS — K649 Unspecified hemorrhoids: Secondary | ICD-10-CM | POA: Diagnosis not present

## 2022-12-07 DIAGNOSIS — I480 Paroxysmal atrial fibrillation: Secondary | ICD-10-CM | POA: Diagnosis not present

## 2022-12-10 DIAGNOSIS — K649 Unspecified hemorrhoids: Secondary | ICD-10-CM | POA: Diagnosis not present

## 2022-12-10 DIAGNOSIS — K219 Gastro-esophageal reflux disease without esophagitis: Secondary | ICD-10-CM | POA: Diagnosis not present

## 2022-12-10 DIAGNOSIS — I480 Paroxysmal atrial fibrillation: Secondary | ICD-10-CM | POA: Diagnosis not present

## 2022-12-10 DIAGNOSIS — I428 Other cardiomyopathies: Secondary | ICD-10-CM | POA: Diagnosis not present

## 2022-12-10 DIAGNOSIS — M1612 Unilateral primary osteoarthritis, left hip: Secondary | ICD-10-CM | POA: Diagnosis not present

## 2022-12-10 DIAGNOSIS — M85852 Other specified disorders of bone density and structure, left thigh: Secondary | ICD-10-CM | POA: Diagnosis not present

## 2022-12-10 DIAGNOSIS — M80052D Age-related osteoporosis with current pathological fracture, left femur, subsequent encounter for fracture with routine healing: Secondary | ICD-10-CM | POA: Diagnosis not present

## 2022-12-10 DIAGNOSIS — I4510 Unspecified right bundle-branch block: Secondary | ICD-10-CM | POA: Diagnosis not present

## 2022-12-10 DIAGNOSIS — I5022 Chronic systolic (congestive) heart failure: Secondary | ICD-10-CM | POA: Diagnosis not present

## 2022-12-12 DIAGNOSIS — S72142A Displaced intertrochanteric fracture of left femur, initial encounter for closed fracture: Secondary | ICD-10-CM | POA: Diagnosis not present

## 2022-12-13 DIAGNOSIS — I428 Other cardiomyopathies: Secondary | ICD-10-CM | POA: Diagnosis not present

## 2022-12-13 DIAGNOSIS — K649 Unspecified hemorrhoids: Secondary | ICD-10-CM | POA: Diagnosis not present

## 2022-12-13 DIAGNOSIS — M85852 Other specified disorders of bone density and structure, left thigh: Secondary | ICD-10-CM | POA: Diagnosis not present

## 2022-12-13 DIAGNOSIS — I480 Paroxysmal atrial fibrillation: Secondary | ICD-10-CM | POA: Diagnosis not present

## 2022-12-13 DIAGNOSIS — I4510 Unspecified right bundle-branch block: Secondary | ICD-10-CM | POA: Diagnosis not present

## 2022-12-13 DIAGNOSIS — I5022 Chronic systolic (congestive) heart failure: Secondary | ICD-10-CM | POA: Diagnosis not present

## 2022-12-13 DIAGNOSIS — M80052D Age-related osteoporosis with current pathological fracture, left femur, subsequent encounter for fracture with routine healing: Secondary | ICD-10-CM | POA: Diagnosis not present

## 2022-12-13 DIAGNOSIS — M1612 Unilateral primary osteoarthritis, left hip: Secondary | ICD-10-CM | POA: Diagnosis not present

## 2022-12-13 DIAGNOSIS — K219 Gastro-esophageal reflux disease without esophagitis: Secondary | ICD-10-CM | POA: Diagnosis not present

## 2022-12-18 ENCOUNTER — Ambulatory Visit: Payer: Medicare PPO | Attending: Cardiology | Admitting: Cardiology

## 2022-12-18 ENCOUNTER — Encounter: Payer: Self-pay | Admitting: Cardiology

## 2022-12-18 VITALS — BP 106/62 | HR 72 | Ht 64.0 in | Wt 118.0 lb

## 2022-12-18 DIAGNOSIS — D151 Benign neoplasm of heart: Secondary | ICD-10-CM | POA: Diagnosis not present

## 2022-12-18 DIAGNOSIS — I480 Paroxysmal atrial fibrillation: Secondary | ICD-10-CM

## 2022-12-18 DIAGNOSIS — I420 Dilated cardiomyopathy: Secondary | ICD-10-CM

## 2022-12-18 DIAGNOSIS — I10 Essential (primary) hypertension: Secondary | ICD-10-CM | POA: Diagnosis not present

## 2022-12-18 NOTE — Patient Instructions (Signed)
Medication Instructions:  Your physician recommends that you continue on your current medications as directed. Please refer to the Current Medication list given to you today.  *If you need a refill on your cardiac medications before your next appointment, please call your pharmacy*   Lab Work: BMP- today If you have labs (blood work) drawn today and your tests are completely normal, you will receive your results only by: MyChart Message (if you have MyChart) OR A paper copy in the mail If you have any lab test that is abnormal or we need to change your treatment, we will call you to review the results.   Testing/Procedures: August Your physician has requested that you have an echocardiogram. Echocardiography is a painless test that uses sound waves to create images of your heart. It provides your doctor with information about the size and shape of your heart and how well your heart's chambers and valves are working. This procedure takes approximately one hour. There are no restrictions for this procedure. Please do NOT wear cologne, perfume, aftershave, or lotions (deodorant is allowed). Please arrive 15 minutes prior to your appointment time.    Follow-Up: At [facility], you and your health needs are our priority.  As part of our continuing mission to provide you with exceptional heart care, we have created designated Provider Care Teams.  These Care Teams include your primary Cardiologist (physician) and Advanced Practice Providers (APPs -  Physician Assistants and Nurse Practitioners) who all work together to provide you with the care you need, when you need it.  We recommend signing up for the patient portal called "MyChart".  Sign up information is provided on this After Visit Summary.  MyChart is used to connect with patients for Virtual Visits (Telemedicine).  Patients are able to view lab/test results, encounter notes, upcoming appointments, etc.  Non-urgent messages can be sent to  your provider as well.   To learn more about what you can do with MyChart, go to [email or web address].    Your next appointment:   6 month(s)  The format for your next appointment:   In Person  Provider:   Gypsy Balsam, MD    Other Instructions NA

## 2022-12-18 NOTE — Progress Notes (Signed)
Cardiology Office Note:    Date:  12/18/2022   ID:  Cassandra Romero, DOB [birth year], MRN [id number]  PCP:  Lise Auer, MD  Cardiologist:  Gypsy Balsam, MD    Referring MD: Lise Auer, MD   Chief Complaint  Patient presents with   Follow-up    History of Present Illness:    Cassandra Romero is a 89 y.o. female past medical history significant for paroxysmal atrial fibrillation, tachybradycardia syndrome, status post Medtronic pacemaker implantation 2010, status post atrial myxoma resection done in 2010, essential hypertension, lipid dyslipidemia.  She was also find to have cardiomyopathy with diminished ejection fraction to 25% she was put on guideline directed medical therapy with some difficulties because of low blood pressure and last echocardiogram done at the end of last year showed preserved left ventricle ejection fraction.  However recently she ended up falling down apparently she wakes up in the morning got up and went to the restroom and next and she knew she was on the floor she sustained fracture of the left femur she required surgical intervention.  At that time [name] has been withdrawn. She comes today to months for follow-up overall she says she is doing fine.  She denies any chest pain tightness squeezing pressure burning chest heart uncomplicated course of her femur repair and seems to be doing well  Past Medical History:  Diagnosis Date   AICD (automatic cardioverter/defibrillator) present 2010   [place] JUDE   Atrial fibrillation (HCC) 01/27/2009   Centricity Description: BRADYCARDIA Qualifier: Diagnosis of  By: Flonnie Overman   Centricity Description: ATRIAL ARRHYTHMIAS Qualifier: Diagnosis of  By: Flonnie Overman     Atrial myxoma    s/p resection   ATRIAL MYXOMA 01/27/2009   Qualifier: Diagnosis of  By: Flonnie Overman     Chronic kidney disease    kidney tumor   Congestive heart failure (HCC) 06/27/2021   Essential hypertension 07/20/2010   Qualifier:  Diagnosis of  By: Johney Frame, MD, James     Fibrocystic breast changes of both breasts 09/19/2015   GERD 01/27/2009   Qualifier: Diagnosis of  By: Flonnie Overman     Hypertension    Neoplasm of uncertain behavior of skin 04/15/2018   Neurofibroma of back 03/10/2019   OSTEOPENIA 01/27/2009   Qualifier: Diagnosis of  By: Flonnie Overman     Paroxysmal atrial fibrillation (HCC)    Renal benign neoplasm, right 09/28/2020   Right renal mass    Seasonal allergies 09/10/2020   nonproductive cough   SICK SINUS/ TACHY-BRADY SYNDROME 01/27/2009   Qualifier: Diagnosis of  By: Flonnie Overman     Unspecified open wound, right lower leg, sequela 04/16/2016   URI (upper respiratory infection) 09/07/2020   WITH COLD AND COUGH , TOOK 5 DAYS OF ANTIBIOTICS ALL ISSUES RESOLVED PER PT   [place] 06/13/2020   Wears glasses    Wears hearing aid in both ears     Past Surgical History:  Procedure Laterality Date   APPENDECTOMY  1954   OPEN   ATRIAL MYXOMA EXCISION  12/24/2008   Left -- Dr Cornelius Moras   BREAST SURGERY  1 ON RIGHT 2 ON LEFT   BIOPSY LAST DONE 1973, AND X 2 1960'S   CARDIOVERSION N/A 07/11/2021   Procedure: CARDIOVERSION;  Surgeon: Chilton Si, MD;  Location: [facility];  Service: Cardiovascular;  Laterality: N/A;   CHOLECYSTECTOMY  2010   LAPAPROSCIPIC   CYSTOSCOPY W/ URETERAL STENT PLACEMENT Right 09/27/2020   Procedure: CYSTOSCOPY WITH  RETROGRADE PYELOGRAM/URETERAL STENT PLACEMENT;  Surgeon: Rene Paci, MD;  Location: [facility];  Service: Urology;  Laterality: Right;   IR RADIOLOGIST EVAL & MGMT  08/03/2020   IR RADIOLOGIST EVAL & MGMT  03/07/2021   IR RADIOLOGIST EVAL & MGMT  10/09/2021   PACEMAKER INSERTION  02/02/2009   PARTIAL HYSTERECTOMY  1973   STILL HAS OVARIES   RADIOLOGY WITH ANESTHESIA Right 09/28/2020   Procedure: IR WITH ANESTHESIA CT CRYOABLATION;  Surgeon: Sterling Big, MD;  Location: [facility];  Service: Anesthesiology;  Laterality:  Right;   stapendectomy Bilateral 1970's   TEE WITHOUT CARDIOVERSION N/A 07/11/2021   Procedure: TRANSESOPHAGEAL ECHOCARDIOGRAM (TEE);  Surgeon: Chilton Si, MD;  Location: [facility] ENDOSCOPY;  Service: Cardiovascular;  Laterality: N/A;    Current Medications: Current Meds  Medication Sig   acetaminophen (TYLENOL) 500 MG tablet Take 500 mg by mouth every 6 (six) hours as needed for mild pain.   apixaban (ELIQUIS) 2.5 MG TABS tablet Take 1 tablet (2.5 mg total) by mouth 2 (two) times daily.   dicyclomine (BENTYL) 10 MG capsule Take 10 mg by mouth 3 (three) times daily as needed for spasms.   famotidine (PEPCID) 20 MG tablet Take 20 mg by mouth 2 (two) times daily as needed for heartburn or indigestion.   furosemide (LASIX) 20 MG tablet Take 1 tablet (20 mg total) by mouth daily.   [DISCONTINUED] potassium chloride (KLOR-CON) 10 MEQ tablet Take 1 tablet (10 mEq total) by mouth daily.   [DISCONTINUED] sacubitril-valsartan (ENTRESTO) 24-26 MG Take 1 tablet by mouth 2 (two) times daily.     Allergies:   Aspirin, Alendronate sodium, Amiodarone hcl, Bactrim [sulfamethoxazole-trimethoprim], Ibandronate sodium, Risedronate sodium, Statins, Vimovo [naproxen-esomeprazole mg], Xarelto [rivaroxaban], Metaxalone, Mobic [meloxicam], and Toprol xl [metoprolol]   Social History   Socioeconomic History   Marital status: Married    Spouse name: Not on file   Number of children: Not on file   Years of education: Not on file   Highest education level: Not on file  Occupational History   Occupation: retired Runner, broadcasting/film/video  Tobacco Use   Smoking status: Former    Current packs/day: 0.00    Average packs/day: 0.3 packs/day for 12.0 years (3.0 ttl pk-yrs)    Types: Cigarettes    Start date: 06/04/1976    Quit date: 06/04/1988    Years since quitting: 34.5   Smokeless tobacco: Never  Vaping Use   Vaping status: Never Used  Substance and Sexual Activity   Alcohol use: Not Currently   Drug use: No   Sexual  activity: Not on file  Other Topics Concern   Not on file  Social History Narrative   Lives in [place]   Social Determinants of Health   Financial Resource Strain: Not on file  Food Insecurity: Not on file  Transportation Needs: Not on file  Physical Activity: Not on file  Stress: Not on file  Social Connections: Not on file     Family History: The patient's family history includes Coronary artery disease in an other family member; Dementia in her mother and another family member; Heart attack (age of onset: 41) in her father; Heart disease in her father; Pancreatic cancer in her mother and another family member; Thyroid disease in her mother; Transient ischemic attack in her mother. ROS:   Please see the history of present illness.    All 14 point review of systems negative except as described per history of present illness  EKGs/Labs/Other Studies  Reviewed:         Recent Labs: 07/13/2022: BUN 19; Creatinine, Ser 0.88; Potassium 3.7; Sodium 143  Recent Lipid Panel    Component Value Date/Time   CHOL  12/22/2008 0150    146        ATP III CLASSIFICATION:  <200     mg/dL   Desirable  660-630  mg/dL   Borderline High  >=160    mg/dL   High          TRIG 83 12/22/2008 0150   HDL 36 (L) 12/22/2008 0150   CHOLHDL 4.1 12/22/2008 0150   VLDL 17 12/22/2008 0150   LDLCALC  12/22/2008 0150    93        Total Cholesterol/HDL:CHD Risk Coronary Heart Disease Risk Table                     Men   Women  1/2 Average Risk   3.4   3.3  Average Risk       5.0   4.4  2 X Average Risk   9.6   7.1  3 X Average Risk  23.4   11.0        Use the calculated Patient Ratio above and the CHD Risk Table to determine the patient's CHD Risk.        ATP III CLASSIFICATION (LDL):  <100     mg/dL   Optimal  109-323  mg/dL   Near or Above                    Optimal  130-159  mg/dL   Borderline  557-322  mg/dL   High  >025     mg/dL   Very High    Physical Exam:    VS:  BP 106/62 (BP  Location: Left Arm, Patient Position: Sitting)   Pulse 72   Ht 5\' 4"  (1.626 m)   Wt 118 lb (53.5 kg)   SpO2 91%   BMI 20.25 kg/m     Wt Readings from Last 3 Encounters:  12/18/22 118 lb (53.5 kg)  07/06/22 127 lb 9.6 oz (57.9 kg)  03/20/22 126 lb 6.4 oz (57.3 kg)     GEN:  Well nourished, well developed in no acute distress HEENT: Normal NECK: No JVD; No carotid bruits LYMPHATICS: No lymphadenopathy CARDIAC: RRR, no murmurs, no rubs, no gallops RESPIRATORY:  Clear to auscultation without rales, wheezing or rhonchi  ABDOMEN: Soft, non-tender, non-distended MUSCULOSKELETAL:  No edema; No deformity  SKIN: Warm and dry LOWER EXTREMITIES: no swelling NEUROLOGIC:  Alert and oriented x 3 PSYCHIATRIC:  Normal affect   ASSESSMENT:    1. Paroxysmal atrial fibrillation (HCC)   2. Atrial myxoma s/p resection done more than 20 years ago   3. Essential hypertension   4. Dilated cardiomyopathy (HCC) ejection fraction 25 to 30% in 2023    PLAN:    In order of problems listed above:  Paroxysmal atrial fibrillation seems to maintain sinus rhythm I did review interrogation of her pacemaker and I do not see any significant arrhythmia.  Will ask her to have her device interrogated again. Episode of syncope probably related to orthostatic hypotension related to excess medications she is doing quite well after [name] has been withdrawn, I will schedule her to have an echocardiogram in a month to recheck left ventricle ejection fraction.  If ejection fraction is diminished then we may consider addition of some extra medication but now  I am concerned about her falling down breaking bones. Essential hypertension blood pressure is well-controlled. History of dilated cardiomyopathy close normalization based on last echocardiogram, echocardiogram will be repeated since patient is of medications right now. Pacemaker present we get 24 months left in the device.  Will continue monitoring it is a  Medtronic device.  I did review interrogation of the device   Medication Adjustments/Labs and Tests Ordered: Current medicines are reviewed at length with the patient today.  Concerns regarding medicines are outlined above.  Orders Placed This Encounter  Procedures   EKG 12-Lead   Medication changes: No orders of the defined types were placed in this encounter.   Signed, Georgeanna Lea, MD, [facility] 12/18/2022 2:42 PM    [facility]

## 2022-12-18 NOTE — Addendum Note (Signed)
Addended by: Baldo Ash D on: 12/18/2022 02:57 PM   Modules accepted: Orders

## 2022-12-19 LAB — BASIC METABOLIC PANEL
BUN/Creatinine Ratio: 17 (ref 12–28)
BUN: 14 mg/dL (ref 8–27)
CO2: 26 mmol/L (ref 20–29)
Calcium: 9.7 mg/dL (ref 8.7–10.3)
Chloride: 100 mmol/L (ref 96–106)
Creatinine, Ser: 0.83 mg/dL (ref 0.57–1.00)
Glucose: 89 mg/dL (ref 70–99)
Potassium: 4 mmol/L (ref 3.5–5.2)
Sodium: 141 mmol/L (ref 134–144)
eGFR: 68 mL/min/{1.73_m2} (ref >59)

## 2022-12-25 ENCOUNTER — Telehealth: Payer: Self-pay | Admitting: Cardiology

## 2022-12-25 NOTE — Telephone Encounter (Signed)
Patient is returning call to discuss lab results. 

## 2022-12-25 NOTE — Telephone Encounter (Signed)
No answer or VM.  Chem-7 looks good, continue present management.

## 2022-12-25 NOTE — Telephone Encounter (Signed)
Results reviewed with pt as per Dr. Krasowski's note.  Pt verbalized understanding and had no additional questions. Routed to PCP  

## 2023-01-02 ENCOUNTER — Ambulatory Visit: Payer: Medicare PPO

## 2023-01-17 ENCOUNTER — Ambulatory Visit: Payer: Medicare PPO

## 2023-01-22 ENCOUNTER — Ambulatory Visit (INDEPENDENT_AMBULATORY_CARE_PROVIDER_SITE_OTHER): Payer: Medicare PPO

## 2023-01-22 DIAGNOSIS — I495 Sick sinus syndrome: Secondary | ICD-10-CM

## 2023-01-23 ENCOUNTER — Other Ambulatory Visit: Payer: Self-pay | Admitting: Cardiology

## 2023-01-23 DIAGNOSIS — S72142A Displaced intertrochanteric fracture of left femur, initial encounter for closed fracture: Secondary | ICD-10-CM | POA: Diagnosis not present

## 2023-01-23 DIAGNOSIS — I480 Paroxysmal atrial fibrillation: Secondary | ICD-10-CM

## 2023-01-23 LAB — CUP PACEART REMOTE DEVICE CHECK
Battery Impedance: 3236 Ohm
Battery Remaining Longevity: 20 mo
Battery Voltage: 2.71 V
Brady Statistic AP VP Percent: 0 %
Brady Statistic AP VS Percent: 42 %
Brady Statistic AS VP Percent: 0 %
Brady Statistic AS VS Percent: 58 %
Date Time Interrogation Session: 20240821143959
Implantable Lead Connection Status: 753985
Implantable Lead Connection Status: 753985
Implantable Lead Implant Date: 20100729
Implantable Lead Implant Date: 20100729
Implantable Lead Location: 753859
Implantable Lead Location: 753860
Implantable Lead Model: 5076
Implantable Lead Model: 5076
Implantable Pulse Generator Implant Date: 20100729
Lead Channel Impedance Value: 430 Ohm
Lead Channel Impedance Value: 567 Ohm
Lead Channel Pacing Threshold Amplitude: 0.625 V
Lead Channel Pacing Threshold Amplitude: 1.375 V
Lead Channel Pacing Threshold Pulse Width: 0.4 ms
Lead Channel Pacing Threshold Pulse Width: 0.4 ms
Lead Channel Setting Pacing Amplitude: 2 V
Lead Channel Setting Pacing Amplitude: 2.75 V
Lead Channel Setting Pacing Pulse Width: 0.4 ms
Lead Channel Setting Sensing Sensitivity: 2.8 mV
Zone Setting Status: 755011
Zone Setting Status: 755011

## 2023-01-23 NOTE — Telephone Encounter (Signed)
Prescription refill request for Eliquis received. Indication: Afib  Last office visit: 12/18/22 Bing Matter)  Scr: 0.83 (12/18/22)  Age: 89 Weight: 53.5kg  Appropriate dose. Refill sent.

## 2023-01-29 DIAGNOSIS — Z636 Dependent relative needing care at home: Secondary | ICD-10-CM | POA: Diagnosis not present

## 2023-01-29 DIAGNOSIS — Z681 Body mass index (BMI) 19 or less, adult: Secondary | ICD-10-CM | POA: Diagnosis not present

## 2023-01-29 DIAGNOSIS — R634 Abnormal weight loss: Secondary | ICD-10-CM | POA: Diagnosis not present

## 2023-02-01 NOTE — Progress Notes (Signed)
Remote pacemaker transmission.   

## 2023-02-15 DIAGNOSIS — K581 Irritable bowel syndrome with constipation: Secondary | ICD-10-CM | POA: Diagnosis not present

## 2023-02-20 ENCOUNTER — Ambulatory Visit: Payer: Medicare PPO | Attending: Cardiology

## 2023-02-20 DIAGNOSIS — I420 Dilated cardiomyopathy: Secondary | ICD-10-CM | POA: Diagnosis not present

## 2023-02-20 LAB — ECHOCARDIOGRAM COMPLETE
Area-P 1/2: 3.71 cm2
MV M vel: 4.46 m/s
MV Peak grad: 79.6 mmHg
S' Lateral: 3.1 cm

## 2023-03-02 NOTE — Progress Notes (Unsigned)
Electrophysiology Office Note:   Date:  03/04/2023  ID:  Cassandra Romero, DOB [birth year], MRN [id number]  Primary Cardiologist: Gypsy Balsam, MD Electrophysiologist: Regan Lemming, MD      History of Present Illness:   Cassandra Romero is a 89 y.o. female with h/o paroxysmal atrial fibrillation, tachybradycardia syndrome, atrial myxoma postresection in 2010, hypertension, hyperlipidemia seen today for routine electrophysiology followup.   Since last being seen in our clinic the patient reports doing well from a cardiac perspective.  She has no chest pain or shortness of breath.  She is able to do all of her daily activities without restriction.  She is under quite a bit of stress at home as her husband has dementia.  Aside from that she has no complaints.  she denies chest pain, palpitations, dyspnea, PND, orthopnea, nausea, vomiting, dizziness, syncope, edema, weight gain, or early satiety.   Review of systems complete and found to be negative unless listed in HPI.      EP Information / Studies Reviewed:    EKG is not ordered today. EKG from 12/18/22 reviewed which showed sinus rhythm      PPM Interrogation-  reviewed in detail today,  See PACEART report.  Device History: Medtronic Dual Chamber PPM implanted 2010 for Tachy-Brady syndrome  Risk Assessment/Calculations:    CHA2DS2-VASc Score = 4   This indicates a 4.8% annual risk of stroke. The patient's score is based upon: CHF History: 0 HTN History: 1 Diabetes History: 0 Stroke History: 0 Vascular Disease History: 0 Age Score: 2 Gender Score: 1             Physical Exam:   VS:  BP 126/84   Pulse 60   Ht 5\' 4"  (1.626 m)   Wt 118 lb (53.5 kg)   SpO2 92%   BMI 20.25 kg/m    Wt Readings from Last 3 Encounters:  03/04/23 118 lb (53.5 kg)  12/18/22 118 lb (53.5 kg)  07/06/22 127 lb 9.6 oz (57.9 kg)     GEN: Well nourished, well developed in no acute distress NECK: No JVD; No carotid bruits CARDIAC:  Regular rate and rhythm, no murmurs, rubs, gallops RESPIRATORY:  Clear to auscultation without rales, wheezing or rhonchi  ABDOMEN: Soft, non-tender, non-distended EXTREMITIES:  No edema; No deformity   ASSESSMENT AND PLAN:    Tachy-Brady syndrome s/p Medtronic PPM  Normal PPM function See Pace Art report No changes today  2.  Paroxysmal atrial fibrillation: Low burden on pacemaker.  Continue with current management.  3.  Chronic systolic heart failure due to dilated cardiomyopathy: Ejection fraction 25 to 30%.  Currently on medical therapy per primary cardiology  4.  Secondary hypercoagulable state: Currently on Eliquis for atrial fibrillation  Disposition:   Follow up with Dr. Elberta Fortis in 12 months  Signed, Dionysios Massman Jorja Loa, MD

## 2023-03-04 ENCOUNTER — Ambulatory Visit: Payer: Medicare PPO | Attending: Cardiology | Admitting: Cardiology

## 2023-03-04 ENCOUNTER — Encounter: Payer: Self-pay | Admitting: Cardiology

## 2023-03-04 VITALS — BP 126/84 | HR 60 | Ht 64.0 in | Wt 118.0 lb

## 2023-03-04 DIAGNOSIS — I495 Sick sinus syndrome: Secondary | ICD-10-CM | POA: Diagnosis not present

## 2023-03-04 DIAGNOSIS — I480 Paroxysmal atrial fibrillation: Secondary | ICD-10-CM

## 2023-03-04 DIAGNOSIS — I5022 Chronic systolic (congestive) heart failure: Secondary | ICD-10-CM | POA: Diagnosis not present

## 2023-03-04 DIAGNOSIS — D6869 Other thrombophilia: Secondary | ICD-10-CM

## 2023-03-04 LAB — CUP PACEART INCLINIC DEVICE CHECK
Battery Impedance: 3656 Ohm
Battery Remaining Longevity: 16 mo
Battery Voltage: 2.71 V
Brady Statistic AP VP Percent: 0 %
Brady Statistic AP VS Percent: 45 %
Brady Statistic AS VP Percent: 0 %
Brady Statistic AS VS Percent: 55 %
Date Time Interrogation Session: 20240930124448
Implantable Lead Connection Status: 753985
Implantable Lead Connection Status: 753985
Implantable Lead Implant Date: 20100729
Implantable Lead Implant Date: 20100729
Implantable Lead Location: 753859
Implantable Lead Location: 753860
Implantable Lead Model: 5076
Implantable Lead Model: 5076
Implantable Pulse Generator Implant Date: 20100729
Lead Channel Impedance Value: 442 Ohm
Lead Channel Impedance Value: 541 Ohm
Lead Channel Pacing Threshold Amplitude: 0.5 V
Lead Channel Pacing Threshold Amplitude: 0.74 V
Lead Channel Pacing Threshold Amplitude: 1.125 V
Lead Channel Pacing Threshold Pulse Width: 0.4 ms
Lead Channel Pacing Threshold Pulse Width: 0.4 ms
Lead Channel Pacing Threshold Pulse Width: 0.4 ms
Lead Channel Sensing Intrinsic Amplitude: 2 mV
Lead Channel Sensing Intrinsic Amplitude: 8 mV
Lead Channel Setting Pacing Amplitude: 2 V
Lead Channel Setting Pacing Amplitude: 2.5 V
Lead Channel Setting Pacing Pulse Width: 0.4 ms
Lead Channel Setting Sensing Sensitivity: 2.8 mV
Zone Setting Status: 755011
Zone Setting Status: 755011

## 2023-03-04 NOTE — Patient Instructions (Signed)
Medication Instructions:  Your physician recommends that you continue on your current medications as directed. Please refer to the Current Medication list given to you today.  *If you need a refill on your cardiac medications before your next appointment, please call your pharmacy*   Lab Work: None ordered   Testing/Procedures: None ordered   Follow-Up: At [facility], you and your health needs are our priority.  As part of our continuing mission to provide you with exceptional heart care, we have created designated Provider Care Teams.  These Care Teams include your primary Cardiologist (physician) and Advanced Practice Providers (APPs -  Physician Assistants and Nurse Practitioners) who all work together to provide you with the care you need, when you need it.  We recommend signing up for the patient portal called "MyChart".  Sign up information is provided on this After Visit Summary.  MyChart is used to connect with patients for Virtual Visits (Telemedicine).  Patients are able to view lab/test results, encounter notes, upcoming appointments, etc.  Non-urgent messages can be sent to your provider as well.   To learn more about what you can do with MyChart, go to [email or web address].com.    Your next appointment:   1 year(s)  The format for your next appointment:   In Person  Provider:   Will Camnitz, MD    Thank you for choosing [facility]!!   Luddie Boghosian, RN [phone]    

## 2023-03-05 ENCOUNTER — Telehealth: Payer: Self-pay

## 2023-03-05 NOTE — Telephone Encounter (Signed)
Echo Results reviewed with pt as per Dr. Krasowski's note.  Pt verbalized understanding and had no additional questions. Routed to PCP 

## 2023-04-03 ENCOUNTER — Ambulatory Visit: Payer: Medicare PPO

## 2023-04-23 ENCOUNTER — Ambulatory Visit: Payer: Medicare PPO

## 2023-04-25 DIAGNOSIS — Z1231 Encounter for screening mammogram for malignant neoplasm of breast: Secondary | ICD-10-CM | POA: Diagnosis not present

## 2023-05-01 DIAGNOSIS — S72142A Displaced intertrochanteric fracture of left femur, initial encounter for closed fracture: Secondary | ICD-10-CM | POA: Diagnosis not present

## 2023-05-01 DIAGNOSIS — M7062 Trochanteric bursitis, left hip: Secondary | ICD-10-CM | POA: Diagnosis not present

## 2023-05-13 DIAGNOSIS — Z1231 Encounter for screening mammogram for malignant neoplasm of breast: Secondary | ICD-10-CM | POA: Diagnosis not present

## 2023-05-16 DIAGNOSIS — C641 Malignant neoplasm of right kidney, except renal pelvis: Secondary | ICD-10-CM | POA: Diagnosis not present

## 2023-05-16 DIAGNOSIS — R350 Frequency of micturition: Secondary | ICD-10-CM | POA: Diagnosis not present

## 2023-05-16 DIAGNOSIS — Z6820 Body mass index (BMI) 20.0-20.9, adult: Secondary | ICD-10-CM | POA: Diagnosis not present

## 2023-05-21 DIAGNOSIS — N6012 Diffuse cystic mastopathy of left breast: Secondary | ICD-10-CM | POA: Diagnosis not present

## 2023-05-21 DIAGNOSIS — N6011 Diffuse cystic mastopathy of right breast: Secondary | ICD-10-CM | POA: Diagnosis not present

## 2023-05-24 DIAGNOSIS — C641 Malignant neoplasm of right kidney, except renal pelvis: Secondary | ICD-10-CM | POA: Diagnosis not present

## 2023-05-25 DIAGNOSIS — C641 Malignant neoplasm of right kidney, except renal pelvis: Secondary | ICD-10-CM | POA: Diagnosis not present

## 2023-06-11 ENCOUNTER — Encounter: Payer: Self-pay | Admitting: Cardiology

## 2023-06-11 ENCOUNTER — Ambulatory Visit: Payer: Medicare PPO | Admitting: Cardiology

## 2023-06-11 DIAGNOSIS — Z973 Presence of spectacles and contact lenses: Secondary | ICD-10-CM | POA: Insufficient documentation

## 2023-06-11 DIAGNOSIS — Z974 Presence of external hearing-aid: Secondary | ICD-10-CM | POA: Insufficient documentation

## 2023-06-11 DIAGNOSIS — N2889 Other specified disorders of kidney and ureter: Secondary | ICD-10-CM | POA: Insufficient documentation

## 2023-06-11 DIAGNOSIS — N189 Chronic kidney disease, unspecified: Secondary | ICD-10-CM | POA: Insufficient documentation

## 2023-06-16 NOTE — Progress Notes (Signed)
 Cardiology Office Note:  .   Date:  06/17/2023  ID:  Cassandra Romero, DOB February 23, 1933, MRN [id number] PCP: Fernand Tracey LABOR, MD  [facility] Providers Cardiologist:  Lamar Fitch, MD Electrophysiologist:  Will Gladis Norton, MD    History of Present Illness: .   Cassandra Romero is a 89 y.o. female with a past medical history of atrial fibrillation, presence of PPM, hypertension, HFrEF, atrial myxoma s/p resection, GERD, CKD.   03/04/2023 device check normal 02/20/2023 echo EF 60 to 65%, grade 2 DD, mildly elevated PASP, mild MR, severe TR 04/04/2022 echo EF 50 to 55%, aortic valve sclerosis present without stenosis  Evaluated by Dr. Fitch on 12/18/2022, she was stable from a cardiac perspective-as she did recently fractured her femur and was recovering from that, advised to follow-up in 6 months.  Evaluated by Dr. Norton on 03/04/2023, she was doing well, advised to follow-up in 12 months.  She presents today for follow-up of her atrial fibrillation.  She has been doing good from that perspective, offers no formal complaints.  Tolerating her Eliquis  well, she does state that she occasionally notices blood when she has a bowel movement but thinks it is related to hemorrhoids or fissures as this has been typically problematic for her in the past.  She has bothered with left lower quadrant pain, this been occurring for a few months, she questions whether it could be an issue with her rib as when she applies pressure to it the pain is relieved and this occurs when she is having a bowel movement. She denies chest pain, palpitations, dyspnea, pnd, orthopnea, n, v, dizziness, syncope, edema, weight gain, or early satiety.   ROS: Review of Systems  Gastrointestinal:  Positive for blood in stool.       LLQ pain during bowel movements  Endo/Heme/Allergies:  Bruises/bleeds easily.  All other systems reviewed and are negative.    Studies Reviewed: .        Cardiac Studies & Procedures       ECHOCARDIOGRAM  ECHOCARDIOGRAM COMPLETE 02/20/2023  Narrative ECHOCARDIOGRAM REPORT    Patient Name:   Cassandra Romero Date of Exam: 02/20/2023 Medical Rec #:  [id number]      Height:       64.0 in Accession #:    [id number]     Weight:       118.0 lb Date of Birth:  1933-02-03      BSA:          1.563 m Patient Age:    89 years       BP:           106/62 mmHg Patient Gender: F              HR:           78 bpm. Exam Location:  [place]  Procedure: 2D Echo, Cardiac Doppler, Color Doppler and Strain Analysis  Indications:    Dilated cardiomyopathy (HCC) ejection fraction 25 to 30% in 2023 [I42.0 (ICD-10-CM)]  History:        Patient has no prior history of Echocardiogram examinations, most recent 04/04/2022. Cardiomyopathy, Pacemaker, Arrythmias:Atrial Fibrillation, Signs/Symptoms:Syncope; Risk Factors:Former Smoker and Hypertension.  Sonographer:    Charlie Jointer RDCS Referring Phys: [id number] ROBERT J KRASOWSKI  IMPRESSIONS   1. Left ventricular ejection fraction, by estimation, is 60 to 65%. The left ventricle has normal function. The left ventricle has no regional wall motion abnormalities. Left ventricular diastolic parameters are consistent with  Grade II diastolic dysfunction (pseudonormalization). The average left ventricular global longitudinal strain is -15.8 %. The global longitudinal strain is abnormal. 2. Right ventricular systolic function is normal. The right ventricular size is normal. There is mildly elevated pulmonary artery systolic pressure. 3. The mitral valve is normal in structure. Mild mitral valve regurgitation. No evidence of mitral stenosis. 4. Tricuspid valve regurgitation is severe. 5. The aortic valve is normal in structure. Aortic valve regurgitation is not visualized. No aortic stenosis is present. 6. The inferior vena cava is normal in size with greater than 50% respiratory variability, suggesting right atrial pressure of 3 mmHg.  FINDINGS Left  Ventricle: Left ventricular ejection fraction, by estimation, is 60 to 65%. The left ventricle has normal function. The left ventricle has no regional wall motion abnormalities. The average left ventricular global longitudinal strain is -15.8 %. The global longitudinal strain is abnormal. The left ventricular internal cavity size was normal in size. There is no left ventricular hypertrophy. Left ventricular diastolic parameters are consistent with Grade II diastolic dysfunction (pseudonormalization).  Right Ventricle: The right ventricular size is normal. No increase in right ventricular wall thickness. Right ventricular systolic function is normal. There is mildly elevated pulmonary artery systolic pressure. The tricuspid regurgitant velocity is 3.02 m/s, and with an assumed right atrial pressure of 3 mmHg, the estimated right ventricular systolic pressure is 39.5 mmHg.  Left Atrium: Left atrial size was normal in size.  Right Atrium: Right atrial size was normal in size.  Pericardium: There is no evidence of pericardial effusion.  Mitral Valve: The mitral valve is normal in structure. Mild mitral valve regurgitation. No evidence of mitral valve stenosis.  Tricuspid Valve: The tricuspid valve is normal in structure. Tricuspid valve regurgitation is severe. No evidence of tricuspid stenosis.  Aortic Valve: The aortic valve is normal in structure. Aortic valve regurgitation is not visualized. No aortic stenosis is present.  Pulmonic Valve: The pulmonic valve was normal in structure. Pulmonic valve regurgitation is not visualized. No evidence of pulmonic stenosis.  Aorta: The aortic root is normal in size and structure.  Venous: The inferior vena cava is normal in size with greater than 50% respiratory variability, suggesting right atrial pressure of 3 mmHg.  IAS/Shunts: No atrial level shunt detected by color flow Doppler.  Additional Comments: A device lead is visualized in the right  ventricle and right atrium.   LEFT VENTRICLE PLAX 2D LVIDd:         4.30 cm   Diastology LVIDs:         3.10 cm   LV e' medial:    8.27 cm/s LV PW:         0.80 cm   LV E/e' medial:  12.0 LV IVS:        0.80 cm   LV e' lateral:   9.57 cm/s LVOT diam:     1.70 cm   LV E/e' lateral: 10.3 LV SV:         51 LV SV Index:   33        2D Longitudinal Strain LVOT Area:     2.27 cm  2D Strain GLS Avg:     -15.8 %   RIGHT VENTRICLE            IVC RV Basal diam:  3.70 cm    IVC diam: 3.00 cm RV Mid diam:    3.40 cm RV S prime:     9.48 cm/s TAPSE (M-mode): 1.7 cm  LEFT  ATRIUM             Index        RIGHT ATRIUM           Index LA diam:        2.90 cm 1.85 cm/m   RA Area:     12.80 cm LA Vol (A2C):   24.7 ml 15.80 ml/m  RA Volume:   30.20 ml  19.32 ml/m LA Vol (A4C):   24.0 ml 15.35 ml/m LA Biplane Vol: 24.8 ml 15.86 ml/m AORTIC VALVE LVOT Vmax:   114.33 cm/s LVOT Vmean:  78.033 cm/s LVOT VTI:    0.225 m  AORTA Ao Root diam: 3.00 cm Ao Asc diam:  3.80 cm Ao Desc diam: 2.20 cm  MITRAL VALVE               TRICUSPID VALVE MV Area (PHT): 3.71 cm    TR Peak grad:   36.5 mmHg MV Decel Time: 205 msec    TR Vmax:        302.00 cm/s MR Peak grad: 79.6 mmHg MR Vmax:      446.00 cm/s  SHUNTS MV E velocity: 98.95 cm/s  Systemic VTI:  0.22 m MV A velocity: 88.85 cm/s  Systemic Diam: 1.70 cm MV E/A ratio:  1.11  Lamar Fitch MD Electronically signed by Lamar Fitch MD Signature Date/Time: 02/20/2023/5:08:57 PM    Final  TEE  ECHO TEE 07/11/2021  Narrative TRANSESOPHOGEAL ECHO REPORT    Patient Name:   MERION CATON Date of Exam: 07/11/2021 Medical Rec #:  [id number]      Height:       65.0 in Accession #:    [id number]     Weight:       124.2 lb Date of Birth:  28-Jan-1933      BSA:          1.616 m Patient Age:    88 years       BP:           103/71 mmHg Patient Gender: F              HR:           83 bpm. Exam Location:  Inpatient  Procedure: Transesophageal  Echo, Color Doppler and Cardiac Doppler  Indications:     I48.92* Unspecified atrial flutter  History:         Patient has prior history of Echocardiogram examinations, most recent 06/29/2021. Defibrillator, Arrythmias:Atrial Fibrillation; Risk Factors:Hypertension.  Sonographer:     Damien Senior RDCS Referring Phys:  [id number] ALEENE PARAS NAHSER Diagnosing Phys: Annabella Scarce MD  PROCEDURE: After discussion of the risks and benefits of a TEE, an informed consent was obtained from the patient. The transesophogeal probe was passed without difficulty through the esophogus of the patient. Sedation performed by different physician. The patient was monitored while under deep sedation. Anesthestetic sedation was provided intravenously by Anesthesiology: 159mg  of Propofol , 60mg  of Lidocaine . The patient's vital signs; including heart rate, blood pressure, and oxygen saturation; remained stable throughout the procedure. The patient developed no complications during the procedure. A successful direct current cardioversion was performed at 150 joules with 1 attempt.  IMPRESSIONS   1. Left ventricular ejection fraction, by estimation, is 25 to 30%. The left ventricle has severely decreased function. The left ventricle demonstrates global hypokinesis. 2. Right ventricular systolic function is normal. The right ventricular size is normal. 3. No left atrial/left atrial appendage thrombus was detected.  The LAA emptying velocity was 66 cm/s. 4. Moderate pleural effusion in the left lateral region. 5. The mitral valve is normal in structure. Mild mitral valve regurgitation. No evidence of mitral stenosis. 6. Tricuspid valve regurgitation is moderate. 7. The aortic valve is tricuspid. Aortic valve regurgitation is not visualized. No aortic stenosis is present. 8. Aortic dilatation noted. There is mild dilatation of the ascending aorta, measuring 38 mm. 9. The inferior vena cava is normal in size with greater than  50% respiratory variability, suggesting right atrial pressure of 3 mmHg.  Conclusion(s)/Recommendation(s): Normal biventricular function without evidence of hemodynamically significant valvular heart disease.  FINDINGS Left Ventricle: Left ventricular ejection fraction, by estimation, is 25 to 30%. The left ventricle has severely decreased function. The left ventricle demonstrates global hypokinesis. The left ventricular internal cavity size was normal in size. There is no left ventricular hypertrophy.  Right Ventricle: The right ventricular size is normal. No increase in right ventricular wall thickness. Right ventricular systolic function is normal.  Left Atrium: Left atrial size was normal in size. No left atrial/left atrial appendage thrombus was detected. The LAA emptying velocity was 66 cm/s.  Right Atrium: Right atrial size was normal in size.  Pericardium: There is no evidence of pericardial effusion.  Mitral Valve: The mitral valve is normal in structure. Mild mitral valve regurgitation. No evidence of mitral valve stenosis.  Tricuspid Valve: The tricuspid valve is normal in structure. Tricuspid valve regurgitation is moderate . No evidence of tricuspid stenosis.  Aortic Valve: The aortic valve is tricuspid. Aortic valve regurgitation is not visualized. No aortic stenosis is present.  Pulmonic Valve: The pulmonic valve was normal in structure. Pulmonic valve regurgitation is trivial. No evidence of pulmonic stenosis.  Aorta: Aortic dilatation noted. There is mild dilatation of the ascending aorta, measuring 38 mm.  Venous: The inferior vena cava is normal in size with greater than 50% respiratory variability, suggesting right atrial pressure of 3 mmHg.  IAS/Shunts: No atrial level shunt detected by color flow Doppler.  Additional Comments: There is a moderate pleural effusion in the left lateral region.   MR Peak grad:    94.1 mmHg MR Mean grad:    67.0 mmHg MR Vmax:          485.00 cm/s MR Vmean:        390.0 cm/s MR PISA:         1.01 cm MR PISA Eff ROA: 8 mm MR PISA Radius:  0.40 cm  Annabella Scarce MD Electronically signed by Annabella Scarce MD Signature Date/Time: 07/11/2021/5:59:13 PM    Final            Risk Assessment/Calculations:             Physical Exam:   VS:  BP 130/84 (BP Location: Right Arm, Patient Position: Sitting, Cuff Size: Normal)   Pulse 76   Ht 5' 4 (1.626 m)   Wt 118 lb (53.5 kg)   SpO2 95%   BMI 20.25 kg/m    Wt Readings from Last 3 Encounters:  06/17/23 118 lb (53.5 kg)  03/04/23 118 lb (53.5 kg)  12/18/22 118 lb (53.5 kg)    GEN: Well nourished, well developed in no acute distress NECK: No JVD; No carotid bruits CARDIAC: RRR, 2/6 holosystolic murmur LSB RESPIRATORY:  crackles in lower bases, clear otherwise ABDOMEN: Soft, non-tender, non-distended EXTREMITIES:  No edema; No deformity   ASSESSMENT AND PLAN: .   Paroxysmal atrial fibrillation/hypercoagulable state-CHA2DS2-VASc score of 6, she is  maintaining sinus rhythm.  Continue Eliquis  2.5 mg twice daily--dose reduction indicated based on weight and age.  Denies hematuria, hemoptysis.  Will check CBC, BMET as she is having some bleeding with her bowel movements although she does feel this is related to a fissure.  HFrEF-most recent echo revealed normalization of her EF, NYHA class I-II, euvolemic, continue Lasix  milligrams daily.  Was previously on Entresto  however this dropped her blood pressure drastically leading to a fall which she fractured her femur.  Tricuspid regurgitation-occasionally has a episodes of shortness of breath however overall is doing well.  Left lower quadrant pain-she does have IBS, this has been bothering her for several months, she has advised her PCP of this however the episodes seem to have increased with frequency to the point she is having to apply pressure when she has a bowel movement.  Advised her to follow-up with her PCP.   Outside of when she is having a bowel movement she has no other symptoms including nausea, vomiting, fever.  Presence of PPM-follows with EP.       Dispo: CBC, BMET, follow-up in 6 months.  Signed, Delon JAYSON Hoover, NP

## 2023-06-17 ENCOUNTER — Encounter: Payer: Self-pay | Admitting: Cardiology

## 2023-06-17 ENCOUNTER — Ambulatory Visit: Payer: Medicare PPO | Attending: Cardiology | Admitting: Cardiology

## 2023-06-17 VITALS — BP 130/84 | HR 76 | Ht 64.0 in | Wt 118.0 lb

## 2023-06-17 DIAGNOSIS — Z950 Presence of cardiac pacemaker: Secondary | ICD-10-CM | POA: Diagnosis not present

## 2023-06-17 DIAGNOSIS — I480 Paroxysmal atrial fibrillation: Secondary | ICD-10-CM | POA: Diagnosis not present

## 2023-06-17 DIAGNOSIS — D6869 Other thrombophilia: Secondary | ICD-10-CM | POA: Diagnosis not present

## 2023-06-17 DIAGNOSIS — I071 Rheumatic tricuspid insufficiency: Secondary | ICD-10-CM

## 2023-06-17 DIAGNOSIS — I5020 Unspecified systolic (congestive) heart failure: Secondary | ICD-10-CM

## 2023-06-17 DIAGNOSIS — R1032 Left lower quadrant pain: Secondary | ICD-10-CM | POA: Diagnosis not present

## 2023-06-17 NOTE — Patient Instructions (Signed)
 Medication Instructions:  Your physician recommends that you continue on your current medications as directed. Please refer to the Current Medication list given to you today.  *If you need a refill on your cardiac medications before your next appointment, please call your pharmacy*   Lab Work: Your physician recommends that you have a BMP and CBC today in the office.  If you have labs (blood work) drawn today and your tests are completely normal, you will receive your results only by: MyChart Message (if you have MyChart) OR A paper copy in the mail If you have any lab test that is abnormal or we need to change your treatment, we will call you to review the results.   Testing/Procedures: None ordered   Follow-Up: At [facility], you and your health needs are our priority.  As part of our continuing mission to provide you with exceptional heart care, we have created designated Provider Care Teams.  These Care Teams include your primary Cardiologist (physician) and Advanced Practice Providers (APPs -  Physician Assistants and Nurse Practitioners) who all work together to provide you with the care you need, when you need it.  We recommend signing up for the patient portal called MyChart.  Sign up information is provided on this After Visit Summary.  MyChart is used to connect with patients for Virtual Visits (Telemedicine).  Patients are able to view lab/test results, encounter notes, upcoming appointments, etc.  Non-urgent messages can be sent to your provider as well.   To learn more about what you can do with MyChart, go to [email or web address].    Your next appointment:   6 month(s)  Provider:   Delon Hoover, NP [facility])    Other Instructions none

## 2023-06-18 ENCOUNTER — Telehealth: Payer: Self-pay | Admitting: Emergency Medicine

## 2023-06-18 LAB — BASIC METABOLIC PANEL WITH GFR
BUN/Creatinine Ratio: 22 (ref 12–28)
BUN: 18 mg/dL (ref 8–27)
CO2: 23 mmol/L (ref 20–29)
Calcium: 9.9 mg/dL (ref 8.7–10.3)
Chloride: 103 mmol/L (ref 96–106)
Creatinine, Ser: 0.81 mg/dL (ref 0.57–1.00)
Glucose: 83 mg/dL (ref 70–99)
Potassium: 4.2 mmol/L (ref 3.5–5.2)
Sodium: 143 mmol/L (ref 134–144)
eGFR: 70 mL/min/1.73

## 2023-06-18 LAB — CBC
Hematocrit: 39.9 % (ref 34.0–46.6)
Hemoglobin: 12.9 g/dL (ref 11.1–15.9)
MCH: 29.1 pg (ref 26.6–33.0)
MCHC: 32.3 g/dL (ref 31.5–35.7)
MCV: 90 fL (ref 79–97)
Platelets: 259 x10E3/uL (ref 150–450)
RBC: 4.44 x10E6/uL (ref 3.77–5.28)
RDW: 12.7 % (ref 11.7–15.4)
WBC: 7.1 x10E3/uL (ref 3.4–10.8)

## 2023-06-18 NOTE — Telephone Encounter (Signed)
LVM 1/14

## 2023-06-18 NOTE — Telephone Encounter (Signed)
 Patient returned call. Results reviewed with pt as per Cassandra Bamberg NP's note.  Pt verbalized understanding and had no additional questions. Routed to PCP.

## 2023-06-18 NOTE — Telephone Encounter (Signed)
-----   Message from Flossie Dibble sent at 06/18/2023  8:01 AM EST ----- Labs are perfect, good results.

## 2023-06-21 DIAGNOSIS — S76112A Strain of left quadriceps muscle, fascia and tendon, initial encounter: Secondary | ICD-10-CM | POA: Diagnosis not present

## 2023-06-21 DIAGNOSIS — S72142A Displaced intertrochanteric fracture of left femur, initial encounter for closed fracture: Secondary | ICD-10-CM | POA: Diagnosis not present

## 2023-07-01 DIAGNOSIS — D485 Neoplasm of uncertain behavior of skin: Secondary | ICD-10-CM | POA: Diagnosis not present

## 2023-07-01 DIAGNOSIS — L814 Other melanin hyperpigmentation: Secondary | ICD-10-CM | POA: Diagnosis not present

## 2023-07-03 ENCOUNTER — Ambulatory Visit: Payer: Medicare PPO

## 2023-07-12 DIAGNOSIS — M25552 Pain in left hip: Secondary | ICD-10-CM | POA: Diagnosis not present

## 2023-07-12 DIAGNOSIS — R2689 Other abnormalities of gait and mobility: Secondary | ICD-10-CM | POA: Diagnosis not present

## 2023-07-12 DIAGNOSIS — M79605 Pain in left leg: Secondary | ICD-10-CM | POA: Diagnosis not present

## 2023-07-23 ENCOUNTER — Ambulatory Visit: Payer: Medicare PPO

## 2023-07-26 ENCOUNTER — Other Ambulatory Visit: Payer: Self-pay

## 2023-07-26 DIAGNOSIS — I480 Paroxysmal atrial fibrillation: Secondary | ICD-10-CM

## 2023-07-26 MED ORDER — APIXABAN 2.5 MG PO TABS
2.5000 mg | ORAL_TABLET | Freq: Two times a day (BID) | ORAL | 5 refills | Status: DC
Start: 2023-07-26 — End: 2024-02-21

## 2023-07-26 NOTE — Telephone Encounter (Signed)
Prescription refill request for Eliquis received. Indication:afib Last office visit:1/25 Scr:0.81  1/25 Age: 89 Weight:53.5  kg  Prescription refilled

## 2023-07-31 DIAGNOSIS — C44622 Squamous cell carcinoma of skin of right upper limb, including shoulder: Secondary | ICD-10-CM | POA: Diagnosis not present

## 2023-08-14 DIAGNOSIS — Z636 Dependent relative needing care at home: Secondary | ICD-10-CM | POA: Diagnosis not present

## 2023-08-14 DIAGNOSIS — Z6821 Body mass index (BMI) 21.0-21.9, adult: Secondary | ICD-10-CM | POA: Diagnosis not present

## 2023-08-14 DIAGNOSIS — C641 Malignant neoplasm of right kidney, except renal pelvis: Secondary | ICD-10-CM | POA: Diagnosis not present

## 2023-08-20 DIAGNOSIS — J310 Chronic rhinitis: Secondary | ICD-10-CM | POA: Diagnosis not present

## 2023-08-20 DIAGNOSIS — J342 Deviated nasal septum: Secondary | ICD-10-CM | POA: Diagnosis not present

## 2023-08-24 ENCOUNTER — Other Ambulatory Visit: Payer: Self-pay | Admitting: Cardiology

## 2023-08-26 ENCOUNTER — Encounter: Payer: Self-pay | Admitting: Podiatry

## 2023-08-26 ENCOUNTER — Ambulatory Visit: Admitting: Podiatry

## 2023-08-26 DIAGNOSIS — L84 Corns and callosities: Secondary | ICD-10-CM | POA: Diagnosis not present

## 2023-08-26 DIAGNOSIS — M21619 Bunion of unspecified foot: Secondary | ICD-10-CM

## 2023-08-26 NOTE — Progress Notes (Unsigned)
 Chief Complaint  Patient presents with   Callouses    2 corn on the right foot between big toe and 2nd. There is one on each toe. Not diabetic takes Elliquis    HPI: 89 y.o. female presents today with painful corn to the right second toe interdigital aspect and corresponding lesion to the first toe where the toes rub together.  This has been bothering her for at least a month at this point.  Past Medical History:  Diagnosis Date   Acute decompensated heart failure (HCC) 06/27/2021   Acute pulmonary edema (HCC)    Acute systolic CHF (congestive heart failure) (HCC) 07/09/2021   AICD (automatic cardioverter/defibrillator) present 2010   ST JUDE   AKI (acute kidney injury) (HCC)    Atrial fibrillation (HCC) 01/27/2009   Centricity Description: BRADYCARDIA Qualifier: Diagnosis of  By: Flonnie Overman   Centricity Description: ATRIAL ARRHYTHMIAS Qualifier: Diagnosis of  By: Flonnie Overman     Atrial flutter with rapid ventricular response (HCC)    Atrial myxoma    s/p resection   ATRIAL MYXOMA 01/27/2009   Qualifier: Diagnosis of  By: Flonnie Overman     Chronic kidney disease    kidney tumor   Chronic kidney disease    kidney tumor   Congestive heart failure (HCC) 06/27/2021   Dilated cardiomyopathy (HCC) ejection fraction 25 to 30% in 2023 07/14/2021   Discharge from left nipple 09/19/2015   Disorder of bone and cartilage 01/27/2009   Qualifier: Diagnosis of   By: Flonnie Overman      IMO SNOMED Dx Update Oct 2024     Essential hypertension 07/20/2010   Qualifier: Diagnosis of  By: Johney Frame, MD, James     Fibrocystic breast changes of both breasts 09/19/2015   GERD 01/27/2009   Qualifier: Diagnosis of  By: Flonnie Overman     Hypertension    Hypotension 07/12/2021   Inversion of nipple 12/11/2021   Neoplasm of uncertain behavior of skin 04/15/2018   Neurofibroma of back 03/10/2019   OSTEOPENIA 01/27/2009   Qualifier: Diagnosis of  By: Flonnie Overman     Paroxysmal atrial  fibrillation (HCC)    Pleural effusion on left 07/12/2021   Renal benign neoplasm, right 09/28/2020   Right renal mass    Seasonal allergies 09/10/2020   nonproductive cough   SICK SINUS/ TACHY-BRADY SYNDROME 01/27/2009   Qualifier: Diagnosis of  By: Flonnie Overman     Unspecified open wound, right lower leg, sequela 04/16/2016   URI (upper respiratory infection) 09/07/2020   WITH COLD AND COUGH , TOOK 5 DAYS OF ANTIBIOTICS ALL ISSUES RESOLVED PER PT   Ver[place]cas 06/13/2020   Wears glasses    Wears hearing aid in both ears     Past Surgical History:  Procedure Laterality Date   APPENDECTOMY  1954   OPEN   ATRIAL MYXOMA EXCISION  12/24/2008   Left -- Dr Cornelius Moras   BREAST SURGERY  1 ON RIGHT 2 ON LEFT   BIOPSY LAST DONE 1973, AND X 2 1960'S   CARDIOVERSION N/A 07/11/2021   Procedure: CARDIOVERSION;  Surgeon: Chilton Si, MD;  Location[facility] ENDOSCOPY;  Service: Cardiovascular;  Laterality: N/A;   CHOLECYSTECTOMY  2010   LAPAPROSCIPIC   CYSTOSCOPY W/ URETERAL STENT PLACEMENT Right 09/27/2020   Procedure: CYSTOSCOPY WITH RETROGRADE PYELOGRAM/URETERAL STENT PLACEMENT;  Surgeon: Rene Paci, MD;  Location: [facility];  Service: Urology;  Laterality: Right;   IR RADIOLOGIST EVAL & MGMT  08/03/2020   IR RADIOLOGIST EVAL & MGMT  03/07/2021   IR RADIOLOGIST EVAL & MGMT  10/09/2021   PACEMAKER INSERTION  02/02/2009   PARTIAL HYSTERECTOMY  1973   STILL HAS OVARIES   RADIOLOGY WITH ANESTHESIA Right 09/28/2020   Procedure: IR WITH ANESTHESIA CT CRYOABLATION;  Surgeon: Sterling Big, MD;  Location: [facility];  Service: Anesthesiology;  Laterality: Right;   stapendectomy Bilateral 1970's   TEE WITHOUT CARDIOVERSION N/A 07/11/2021   Procedure: TRANSESOPHAGEAL ECHOCARDIOGRAM (TEE);  Surgeon: Chilton Si, MD;  Location: [facility] ENDOSCOPY;  Service: Cardiovascular;  Laterality: N/A;    Allergies  Allergen Reactions   Aspirin     Makes her feel sick   Alendronate  Sodium     REACTION: extreme fatigue/vision change   Amiodarone Hcl Nausea And Vomiting   Bactrim [Sulfamethoxazole-Trimethoprim] Other (See Comments)    Abdominal pain   Entresto [Sacubitril-Valsartan] Other (See Comments)    BP bottomed out   Ibandronate Sodium     REACTION: extreme fatigue/vision changes   Risedronate Sodium     REACTION: extreme fatigue/vision   Statins Other (See Comments)    Myalgia   Vimovo [Naproxen-Esomeprazole Mg] Other (See Comments)    Leg Cramps   Xarelto [Rivaroxaban]     SEVERE ANEMIA   Metaxalone Rash   Mobic [Meloxicam] Rash   Toprol Xl [Metoprolol] Other (See Comments)    Acute acid reflux    ROS    Physical Exam: There were no vitals filed for this visit.  General: The patient is alert and oriented x3 in no acute distress.  Dermatology: Interdigital callus present right second PIPJ medial aspect with corresponding lesion on first toe lateral aspect.  No underlying callus noted.  Localized inflammation noted to the second PIPJ lesion  Vascular: Palpable pedal pulses bilaterally. Capillary refill within normal limits.  No appreciable edema.  No erythema or calor.  Neurological: Light touch sensation grossly intact bilateral feet.   Musculoskeletal Exam: HAV deformity right foot with the first toe pressing up against the second toe.    Assessment/Plan of Care: 1. Corns and callosities   2. Bunion      No orders of the defined types were placed in this encounter.  None  Discussed clinical findings with patient today.  All symptomatic hyperkeratoses right second toe medial PIPJ and first toe lateral aspect were safely debrided as a courtesy with a sterile #15 blade to patient's level of comfort without incident. We discussed preventative and palliative care of these lesions including supportive and accommodative shoegear, padding, prefabricated and custom molded accommodative orthoses, use of a pumice stone and lotions/creams  daily.  Salinocaine and Band-Aid applied to the second toe lesion.  Toe spacers dispensed the patient today.  Follow-up as needed  Breniyah Romm L. Marchia Bond, AACFAS [facility]     [address] [place], [place] [address]                Office [phone]  Fax [phone]

## 2023-08-26 NOTE — Patient Instructions (Signed)
 More silicone pads can be purchased from:  [email or web address]  Look for toe spacers and separators. Look for gel toe caps and corn pads.  Look for urea 40% cream or ointment and apply to the thickened dry skin / calluses. This can be bought over the counter, at a pharmacy or online such as [employer].  You can also scrub white vinegar on the calluses prior to showering and this to make it easier for you to keep them soft and file down.  He can also file them down using a pumice stone or emery board.

## 2023-09-27 ENCOUNTER — Telehealth: Payer: Self-pay | Admitting: Gastroenterology

## 2023-09-27 NOTE — Telephone Encounter (Signed)
 Good Afternoon Dr. Venice Gillis, I received a call from this patient requesting to make an appointment for her GERD and abdominal pain with you. Patient was has seen by Dr. Randal Bury. Would you please advise on scheduling.   Thank you.

## 2023-10-02 NOTE — Telephone Encounter (Signed)
 OK for APP clinic or mine (routine) RG

## 2023-10-15 DIAGNOSIS — D485 Neoplasm of uncertain behavior of skin: Secondary | ICD-10-CM | POA: Diagnosis not present

## 2023-10-15 DIAGNOSIS — L821 Other seborrheic keratosis: Secondary | ICD-10-CM | POA: Diagnosis not present

## 2023-10-22 ENCOUNTER — Ambulatory Visit (INDEPENDENT_AMBULATORY_CARE_PROVIDER_SITE_OTHER): Payer: Medicare PPO

## 2023-10-22 DIAGNOSIS — I420 Dilated cardiomyopathy: Secondary | ICD-10-CM

## 2023-10-24 ENCOUNTER — Ambulatory Visit: Payer: Self-pay | Admitting: Cardiology

## 2023-10-24 ENCOUNTER — Telehealth: Payer: Self-pay

## 2023-10-24 LAB — CUP PACEART REMOTE DEVICE CHECK
Battery Impedance: 8391 Ohm
Battery Voltage: 2.6 V
Brady Statistic RV Percent Paced: 0 %
Date Time Interrogation Session: 20250521140338
Implantable Lead Connection Status: 753985
Implantable Lead Connection Status: 753985
Implantable Lead Implant Date: 20100729
Implantable Lead Implant Date: 20100729
Implantable Lead Location: 753859
Implantable Lead Location: 753860
Implantable Lead Model: 5076
Implantable Lead Model: 5076
Implantable Pulse Generator Implant Date: 20100729
Lead Channel Impedance Value: 585 Ohm
Lead Channel Impedance Value: 67 Ohm
Lead Channel Setting Pacing Amplitude: 2.75 V
Lead Channel Setting Pacing Pulse Width: 0.4 ms
Lead Channel Setting Sensing Sensitivity: 2 mV
Zone Setting Status: 755011
Zone Setting Status: 755011

## 2023-10-24 NOTE — Telephone Encounter (Signed)
 Pacemaker reached RRT 09/16/23. Patient called to advise. Informed her EP scheduling will contact her for an apt. Pt voiced understanding.

## 2023-10-31 NOTE — Telephone Encounter (Signed)
 Pt is scheduled on 7/3 at 2:30 for PPM Gen Change with Dr. Lawana Pray.   She will get updated labs, instruction letter and scrub at her appt with Brandi on 6/3.

## 2023-11-04 ENCOUNTER — Encounter: Payer: Self-pay | Admitting: Pulmonary Disease

## 2023-11-04 NOTE — Progress Notes (Signed)
 Electrophysiology Office Note:   Date:  11/05/2023  ID:  Wauneta Haddock, DOB 10-14-1932, MRN [id number]  Primary Cardiologist: Ralene Burger, MD Primary Heart Failure: None Electrophysiologist: Will Cortland Ding, MD       History of Present Illness:   Cassandra Romero is a 89 y.o. female with h/o SSS/Tachy-Brady s/p PPM, HFrEF, AF, AFL, HTN, GERD, CKD seen today for routine electrophysiology followup.   Since last being seen in our clinic the patient reports doing well overall. She is the primary caretaker of her husband who has dementia.  She is concerned about having a procedure and needing to care for him in the post op period. They have support from friends.     She denies chest pain, palpitations, dyspnea, PND, orthopnea, nausea, vomiting, dizziness, syncope, edema, weight gain, or early satiety.   Review of systems complete and found to be negative unless listed in HPI.   EP Information / Studies Reviewed:    EKG is ordered today. Personal review as below.  EKG Interpretation Date/Time:  Tuesday November 05 2023 14:45:09 EDT Ventricular Rate:  71 PR Interval:  128 QRS Duration:  96 QT Interval:  406 QTC Calculation: 441 R Axis:   77  Text Interpretation: Sinus rhythm with frequent ventricular-paced complexes and Premature atrial complexes Incomplete right bundle branch block Confirmed by Creighton Doffing ([id number]) on 11/05/2023 5:37:11 PM   PPM Interrogation-  reviewed in detail today,  See PACEART report.  Device History: Medtronic Dual Chamber PPM implanted 12/30/2008 for Sinus Node Dysfunction  Studies:  ECHO 02/2023 > LVEF 60-65%, G2DD  Arrhythmia / AAD AF  AFL     Risk Assessment/Calculations:    CHA2DS2-VASc Score = 6   This indicates a 9.7% annual risk of stroke. The patient's score is based upon: CHF History: 1 HTN History: 1 Diabetes History: 1 Stroke History: 0 Vascular Disease History: 0 Age Score: 2 Gender Score: 1             Physical Exam:    VS:  BP 120/88   Pulse 71   Ht 5\' 4"  (1.626 m)   Wt 129 lb 3.2 oz (58.6 kg)   SpO2 92%   BMI 22.18 kg/m    Wt Readings from Last 3 Encounters:  11/05/23 129 lb 3.2 oz (58.6 kg)  06/17/23 118 lb (53.5 kg)  03/04/23 118 lb (53.5 kg)     GEN: Well nourished, well developed in no acute distress NECK: No JVD; No carotid bruits CARDIAC: Regular rate and rhythm, no murmurs, rubs, gallops RESPIRATORY:  Clear to auscultation without rales, wheezing or rhonchi  ABDOMEN: Soft, non-tender, non-distended EXTREMITIES:  No edema; No deformity   ASSESSMENT AND PLAN:    SND s/p Medtronic PPM  -VVI 65 currently, pt in NSR with rare VP (99.7% sensed) -See Pace Art report -Device at ERI > procedure reviewed with patient -pre-procedure labs, scrub / instructions reviewed with patient  -reviewed post procedure wound care with patient  -last dose of Eliquis  on 6/30 pre-procedure, hold lasix  day of procedure  AF/AFL  CHA2DS2-VASc 6 -continue OAC for stroke prophylaxis   Secondary Hypercoagulable State  -continue Eliquis  2.5mg  BID, dose reviewed and appropriate by age/wt -recent labs reviewed  Chronic Systolic Dysfunction  -euvolemic on exam   Hypertension  -well controlled on current regimen    Disposition:   Follow up with Dr. Lawana Pray as planned for generator change on 12/05/23   Signed, Creighton Doffing, NP-C, AGACNP-BC [facility] - Electrophysiology  11/05/2023, 5:37 PM

## 2023-11-05 ENCOUNTER — Encounter: Payer: Self-pay | Admitting: Pulmonary Disease

## 2023-11-05 ENCOUNTER — Ambulatory Visit: Attending: Pulmonary Disease | Admitting: Pulmonary Disease

## 2023-11-05 VITALS — BP 120/88 | HR 71 | Ht 64.0 in | Wt 129.2 lb

## 2023-11-05 DIAGNOSIS — I5022 Chronic systolic (congestive) heart failure: Secondary | ICD-10-CM | POA: Diagnosis not present

## 2023-11-05 DIAGNOSIS — Z950 Presence of cardiac pacemaker: Secondary | ICD-10-CM | POA: Diagnosis not present

## 2023-11-05 DIAGNOSIS — D6869 Other thrombophilia: Secondary | ICD-10-CM | POA: Diagnosis not present

## 2023-11-05 DIAGNOSIS — I10 Essential (primary) hypertension: Secondary | ICD-10-CM | POA: Diagnosis not present

## 2023-11-05 DIAGNOSIS — I495 Sick sinus syndrome: Secondary | ICD-10-CM | POA: Diagnosis not present

## 2023-11-05 DIAGNOSIS — I480 Paroxysmal atrial fibrillation: Secondary | ICD-10-CM | POA: Diagnosis not present

## 2023-11-05 LAB — CUP PACEART INCLINIC DEVICE CHECK
Battery Impedance: 8790 Ohm
Battery Voltage: 2.59 V
Brady Statistic RV Percent Paced: 0 %
Date Time Interrogation Session: 20250603174012
Implantable Lead Connection Status: 753985
Implantable Lead Connection Status: 753985
Implantable Lead Implant Date: 20100729
Implantable Lead Implant Date: 20100729
Implantable Lead Location: 753859
Implantable Lead Location: 753860
Implantable Lead Model: 5076
Implantable Lead Model: 5076
Implantable Pulse Generator Implant Date: 20100729
Lead Channel Impedance Value: 506 Ohm
Lead Channel Impedance Value: 67 Ohm
Lead Channel Pacing Threshold Amplitude: 0.75 V
Lead Channel Pacing Threshold Pulse Width: 0.4 ms
Lead Channel Setting Pacing Amplitude: 2.75 V
Lead Channel Setting Pacing Pulse Width: 0.4 ms
Lead Channel Setting Sensing Sensitivity: 2 mV
Zone Setting Status: 755011
Zone Setting Status: 755011

## 2023-11-05 NOTE — Patient Instructions (Addendum)
 Medication Instructions:   Your physician recommends that you continue on your current medications as directed. Please refer to the Current Medication list given to you today.   *If you need a refill on your cardiac medications before your next appointment, please call your pharmacy*   Lab Work:  PLEASE GO DOWN STAIRS  [facility]  FIRST FLOOR   ( GET OFF ELEVATORS WALK TOWARDS WAITING AREA LAB LOCATED BY PHARMACY):  BMET AND CBC TODAY     If you have labs (blood work) drawn today and your tests are completely normal, you will receive your results only by: MyChart Message (if you have MyChart) OR A paper copy in the mail If you have any lab test that is abnormal or we need to change your treatment, we will call you to review the results.   Testing/Procedures: SEE LETTER  FOR INSTRUCTIONS FOR DEVICE ON 12-05-23 WITH DR CAMNITZ    Follow-Up: At [facility], you and your health needs are our priority.  As part of our continuing mission to provide you with exceptional heart care, our providers are all part of one team.  This team includes your primary Cardiologist (physician) and Advanced Practice Providers or APPs (Physician Assistants and Nurse Practitioners) who all work together to provide you with the care you need, when you need it.   Your next appointment:    3 month(s)  90 DAYS POST FOLLOW UP  AFTER 12-05-23 ( CONTACT  CASSIE HALL/ ANGELINE HAMMER FOR EP SCHEDULING ISSUES )   Provider:    You may see Will Cortland Ding, MD   We recommend signing up for the patient portal called "MyChart".  Sign up information is provided on this After Visit Summary.  MyChart is used to connect with patients for Virtual Visits (Telemedicine).  Patients are able to view lab/test results, encounter notes, upcoming appointments, etc.  Non-urgent messages can be sent to your provider as well.   To learn more about what you can do with MyChart, go to [email or web address].   Other  Instructions

## 2023-11-06 ENCOUNTER — Ambulatory Visit: Payer: Self-pay | Admitting: Pulmonary Disease

## 2023-11-06 LAB — BASIC METABOLIC PANEL WITH GFR
BUN/Creatinine Ratio: 24 (ref 12–28)
BUN: 20 mg/dL (ref 8–27)
CO2: 23 mmol/L (ref 20–29)
Calcium: 9.7 mg/dL (ref 8.7–10.3)
Chloride: 102 mmol/L (ref 96–106)
Creatinine, Ser: 0.83 mg/dL (ref 0.57–1.00)
Glucose: 86 mg/dL (ref 70–99)
Potassium: 3.8 mmol/L (ref 3.5–5.2)
Sodium: 143 mmol/L (ref 134–144)
eGFR: 67 mL/min/{1.73_m2} (ref >59)

## 2023-11-06 LAB — CBC
Hematocrit: 38.7 % (ref 34.0–46.6)
Hemoglobin: 12.3 g/dL (ref 11.1–15.9)
MCH: 28.5 pg (ref 26.6–33.0)
MCHC: 31.8 g/dL (ref 31.5–35.7)
MCV: 90 fL (ref 79–97)
Platelets: 242 10*3/uL (ref 150–450)
RBC: 4.31 x10E6/uL (ref 3.77–5.28)
RDW: 11.9 % (ref 11.7–15.4)
WBC: 6.9 10*3/uL (ref 3.4–10.8)

## 2023-11-07 ENCOUNTER — Telehealth: Payer: Self-pay | Admitting: Pulmonary Disease

## 2023-11-07 ENCOUNTER — Telehealth: Payer: Self-pay

## 2023-11-07 DIAGNOSIS — I495 Sick sinus syndrome: Secondary | ICD-10-CM

## 2023-11-07 NOTE — Telephone Encounter (Signed)
 Called pt to go over updated PPM Gen Change instructions with her. Her procedure has been moved from 7/3 to 7/7 at [facility] at 9:30 AM.   She will need updated labs due to this and will go to the [place] office to have these done. She will get a copy of her updated instruction letter while she is there.

## 2023-11-07 NOTE — Telephone Encounter (Signed)
 Pt aware that she only has a week from  current date to get battery changed rescheduled.  With it currently scheduled the day before a holiday she is having trouble finding someone to stay with her.  She is agreeable to 7/7 at [facility]  Aware procedure scheduler, April, will contact her today to go over these instructions.

## 2023-11-07 NOTE — Telephone Encounter (Signed)
 Pt is calling to r/s her Procedure on 12/05/23

## 2023-11-07 NOTE — Telephone Encounter (Signed)
 Went over updated instructions with pt for new PPM Gen Change procedure date.   7/7 at [facility] at 9:30 am.

## 2023-11-11 DIAGNOSIS — I495 Sick sinus syndrome: Secondary | ICD-10-CM | POA: Diagnosis not present

## 2023-11-12 LAB — CBC
Hematocrit: 39.4 % (ref 34.0–46.6)
Hemoglobin: 12.4 g/dL (ref 11.1–15.9)
MCH: 28.2 pg (ref 26.6–33.0)
MCHC: 31.5 g/dL (ref 31.5–35.7)
MCV: 90 fL (ref 79–97)
Platelets: 271 10*3/uL (ref 150–450)
RBC: 4.4 x10E6/uL (ref 3.77–5.28)
RDW: 12.1 % (ref 11.7–15.4)
WBC: 6.1 10*3/uL (ref 3.4–10.8)

## 2023-11-12 LAB — BASIC METABOLIC PANEL WITH GFR
BUN/Creatinine Ratio: 17 (ref 12–28)
BUN: 14 mg/dL (ref 8–27)
CO2: 22 mmol/L (ref 20–29)
Calcium: 9.8 mg/dL (ref 8.7–10.3)
Chloride: 105 mmol/L (ref 96–106)
Creatinine, Ser: 0.84 mg/dL (ref 0.57–1.00)
Glucose: 130 mg/dL — ABNORMAL HIGH (ref 70–99)
Potassium: 3.9 mmol/L (ref 3.5–5.2)
Sodium: 144 mmol/L (ref 134–144)
eGFR: 66 mL/min/{1.73_m2} (ref >59)

## 2023-11-18 ENCOUNTER — Telehealth: Payer: Self-pay | Admitting: *Deleted

## 2023-11-18 DIAGNOSIS — K219 Gastro-esophageal reflux disease without esophagitis: Secondary | ICD-10-CM | POA: Diagnosis not present

## 2023-11-18 DIAGNOSIS — K921 Melena: Secondary | ICD-10-CM | POA: Insufficient documentation

## 2023-11-18 DIAGNOSIS — R194 Change in bowel habit: Secondary | ICD-10-CM | POA: Diagnosis not present

## 2023-11-18 NOTE — Telephone Encounter (Signed)
   Pre-operative Risk Assessment    Patient Name: Cassandra Romero  DOB: 1932-12-31 MRN: [id number]   Date of last office visit: 11/05/23 Creighton Doffing, NP Date of next office visit: 01/02/24 DR. KRASOWSKI   Request for Surgical Clearance    Procedure:  COLONOSCOPY AND EGD  Date of Surgery:  Clearance 12/05/23                                Surgeon:  DR. Jonita Neth Surgeon's Group or Practice Name:  [facility] Phone number:  [phone] Fax number:  [phone]   Type of Clearance Requested:   - Medical  - Pharmacy:  Hold Apixaban  (Eliquis )     Type of Anesthesia:  PROPOFOL    Additional requests/questions:    [name]   11/18/2023, 11:42 AM

## 2023-11-19 ENCOUNTER — Telehealth: Payer: Self-pay | Admitting: Cardiology

## 2023-11-19 NOTE — Telephone Encounter (Signed)
 Patient with diagnosis of afib on Eliquis  for anticoagulation.    Procedure: COLONOSCOPY AND EGD  Date of procedure: 12/05/23   CHA2DS2-VASc Score = 6   This indicates a 9.7% annual risk of stroke. The patient's score is based upon: CHF History: 1 HTN History: 1 Diabetes History: 1 Stroke History: 0 Vascular Disease History: 0 Age Score: 2 Gender Score: 1     CrCl 39 mL/min Platelet count 271  Patient has not had an Afib/aflutter ablation within the last 3 months or DCCV within the last 30 days   Per office protocol, patient can hold Eliquis  for 2 days prior to procedure.   Patient will not need bridging with Lovenox (enoxaparin) around procedure.  **This guidance is not considered finalized until pre-operative APP has relayed final recommendations.**

## 2023-11-19 NOTE — Telephone Encounter (Signed)
 Patient with diagnosis of A Fib on Eliquis  for anticoagulation.    Procedure: COLONOSCOPY AND EGD  Date of procedure: 12/05/23   CHA2DS2-VASc Score = 6  This indicates a 9.7% annual risk of stroke. The patient's score is based upon: CHF History: 1 HTN History: 1 Diabetes History: 1 Stroke History: 0 Vascular Disease History: 0 Age Score: 2 Gender Score: 1   CrCl 42 ml/min Platelet count 271K   Per office protocol, patient can hold Eliquis  for 2 days prior to procedure.     **This guidance is not considered finalized until pre-operative APP has relayed final recommendations.**

## 2023-11-19 NOTE — Telephone Encounter (Signed)
 Dr. Lawana Pray,  This patient is scheduled for gen change with you on 7/7. She is pending colonoscopy/EGD on 7/3.  Do you have any concerns regarding her undergoing colonoscopy/EGD 4 days prior to her procedure with you?   Please route your response to P CV DIV Preop. I will communicate with requesting office once you have given recommendations.   Thank you!  Morey Ar, NP

## 2023-11-19 NOTE — Telephone Encounter (Signed)
 New Message:         Patient says she is having a procedure with her GI doctor on 12-05-23 and she is having her pacemaker put in on 12-09-23. She wants to know if there will be a problem with her being put to sleep that close together? She said they will be using  Propofol  for the GI procedure.

## 2023-11-19 NOTE — Telephone Encounter (Signed)
 Please advise holding Eliquis  prior to EGD/colonoscopy on 7/3. Patient is scheduled for PPM gen change on 7/7.   Thank you!  DW

## 2023-11-21 NOTE — Telephone Encounter (Signed)
     Primary Cardiologist: Ralene Burger, MD  Chart reviewed as part of pre-operative protocol coverage. Given past medical history and time since last visit, based on ACC/AHA guidelines, SHAKETHA JEON would be at acceptable risk for the planned procedure without further cardiovascular testing.   Per office protocol, patient can hold Eliquis  for 2 days prior to procedure.   I will route this recommendation to the requesting party via Epic fax function and remove from pre-op pool.  Please call with questions.  Chet Cota. Carrigan Delafuente NP-C     11/21/2023, 11:56 AM [facility] [address] Office [phone] Fax [phone]

## 2023-11-25 NOTE — Telephone Encounter (Signed)
 Per Dr. Krasowski- forwarded to Dr. Inocencio

## 2023-11-27 ENCOUNTER — Telehealth (HOSPITAL_COMMUNITY): Payer: Self-pay

## 2023-11-27 NOTE — Telephone Encounter (Signed)
 Spoke with patient to discuss upcoming procedure.   Confirmed patient is scheduled for a PPM generator change on Monday, July 7 with Dr. Soyla Norton. Instructed patient to arrive at the [facility] of [facility], [address], [place] [address] and check in at 8:00 AM.   Labs completed  Any recent signs of acute illness or been started on antibiotics?  NO Any new medications started? NO Any medications to hold? Hold Eliquis  2 days prior to procedure- last dose on July 4. Medication instructions:  On the morning of your procedure DO NOT take any medication. No eating or drinking after midnight prior to procedure.   The night before your procedure and the morning of your procedure, wash thoroughly with the CHG surgical soap from the neck down, paying special attention to the area where your procedure will be performed.  Advised of plan to go home the same day and will only stay overnight if medically necessary. You MUST have a responsible adult to drive you home and MUST be with you the first 24 hours after you arrive home.  Patient verbalized understanding to all instructions provided and agreed to proceed with procedure.

## 2023-11-27 NOTE — Telephone Encounter (Signed)
 Patient reports that she has rescheduled her GI procedure to after the PPM generator change, moved it to end of July.  She appreciates my follow up call though.

## 2023-12-04 ENCOUNTER — Encounter: Payer: Self-pay | Admitting: Emergency Medicine

## 2023-12-05 ENCOUNTER — Ambulatory Visit (HOSPITAL_COMMUNITY): Admit: 2023-12-05 | Admitting: Cardiology

## 2023-12-05 ENCOUNTER — Encounter: Payer: Self-pay | Admitting: Certified Registered"

## 2023-12-05 ENCOUNTER — Encounter (HOSPITAL_COMMUNITY): Payer: Self-pay

## 2023-12-05 SURGERY — PPM GENERATOR CHANGEOUT

## 2023-12-09 ENCOUNTER — Ambulatory Visit
Admission: RE | Admit: 2023-12-09 | Discharge: 2023-12-09 | Disposition: A | Discharge status: Home / Self Care | Attending: Cardiology | Admitting: Cardiology

## 2023-12-09 ENCOUNTER — Other Ambulatory Visit: Payer: Self-pay

## 2023-12-09 ENCOUNTER — Encounter
Admission: RE | Disposition: A | Discharge status: Home / Self Care | Payer: Self-pay | Source: Home / Self Care | Attending: Cardiology

## 2023-12-09 ENCOUNTER — Encounter: Payer: Self-pay | Admitting: Cardiology

## 2023-12-09 DIAGNOSIS — R001 Bradycardia, unspecified: Secondary | ICD-10-CM | POA: Diagnosis not present

## 2023-12-09 DIAGNOSIS — I130 Hypertensive heart and chronic kidney disease with heart failure and stage 1 through stage 4 chronic kidney disease, or unspecified chronic kidney disease: Secondary | ICD-10-CM | POA: Insufficient documentation

## 2023-12-09 DIAGNOSIS — Z45010 Encounter for checking and testing of cardiac pacemaker pulse generator [battery]: Secondary | ICD-10-CM | POA: Diagnosis not present

## 2023-12-09 DIAGNOSIS — N189 Chronic kidney disease, unspecified: Secondary | ICD-10-CM | POA: Insufficient documentation

## 2023-12-09 DIAGNOSIS — I5022 Chronic systolic (congestive) heart failure: Secondary | ICD-10-CM | POA: Insufficient documentation

## 2023-12-09 HISTORY — PX: PPM GENERATOR CHANGEOUT: EP1233

## 2023-12-09 SURGERY — PPM GENERATOR CHANGEOUT
Anesthesia: General

## 2023-12-09 MED ORDER — SODIUM CHLORIDE 0.9 % IV SOLN
80.0000 mg | INTRAVENOUS | Status: DC
  Filled 2023-12-09 (×2): qty 2

## 2023-12-09 MED ORDER — LIDOCAINE HCL (PF) 1 % IJ SOLN
INTRAMUSCULAR | Status: AC
  Filled 2023-12-09: qty 60

## 2023-12-09 MED ORDER — CEFAZOLIN SODIUM-DEXTROSE 2-4 GM/100ML-% IV SOLN
INTRAVENOUS | Status: AC
Start: 2023-12-09 — End: 2023-12-09
  Filled 2023-12-09: qty 100

## 2023-12-09 MED ORDER — SODIUM CHLORIDE 0.9% FLUSH: 3.0000 mL | Freq: Two times a day (BID) | INTRAVENOUS | Status: DC

## 2023-12-09 MED ORDER — LIDOCAINE HCL (PF) 1 % IJ SOLN
INTRAMUSCULAR | Status: DC | PRN
  Administered 2023-12-09: 20 mL

## 2023-12-09 MED ORDER — MIDAZOLAM HCL 2 MG/2ML IJ SOLN
INTRAMUSCULAR | Status: DC | PRN
  Administered 2023-12-09: 1 mg via INTRAVENOUS

## 2023-12-09 MED ORDER — MIDAZOLAM HCL 2 MG/2ML IJ SOLN
INTRAMUSCULAR | Status: AC
  Filled 2023-12-09: qty 2

## 2023-12-09 MED ORDER — ACETAMINOPHEN 325 MG PO TABS: 325.0000 mg | ORAL_TABLET | ORAL | Status: DC | PRN

## 2023-12-09 MED ORDER — FENTANYL CITRATE PF 50 MCG/ML IJ SOSY
PREFILLED_SYRINGE | INTRAMUSCULAR | Status: AC
  Filled 2023-12-09: qty 1

## 2023-12-09 MED ORDER — SODIUM CHLORIDE 0.9% FLUSH: 3.0000 mL | INTRAVENOUS | Status: DC | PRN

## 2023-12-09 MED ORDER — ONDANSETRON HCL 4 MG/2ML IJ SOLN: 4.0000 mg | Freq: Four times a day (QID) | INTRAMUSCULAR | Status: DC | PRN

## 2023-12-09 MED ORDER — FENTANYL CITRATE (PF) 100 MCG/2ML IJ SOLN
INTRAMUSCULAR | Status: DC | PRN
  Administered 2023-12-09: 25 ug via INTRAVENOUS

## 2023-12-09 MED ORDER — CEFAZOLIN SODIUM-DEXTROSE 2-4 GM/100ML-% IV SOLN
2.0000 g | INTRAVENOUS | Status: AC
  Administered 2023-12-09: 2 g via INTRAVENOUS

## 2023-12-09 MED ORDER — SODIUM CHLORIDE 0.9 % IV SOLN
INTRAVENOUS | Status: DC | PRN
  Administered 2023-12-09: 80 mg

## 2023-12-09 MED ORDER — SODIUM CHLORIDE 0.9 % IV SOLN
INTRAVENOUS | Status: DC
  Administered 2023-12-09: 500 mL via INTRAVENOUS

## 2023-12-09 SURGICAL SUPPLY — 8 items
DEVICE DSSCT PLSMBLD 3.0S LGHT (MISCELLANEOUS) IMPLANT
IPG PACE AZUR XT DR MRI W1DR01 (Pacemaker) IMPLANT
PAD ELECT DEFIB RADIOL ZOLL (MISCELLANEOUS) IMPLANT
POUCH AIGIS-R ANTIBACT PPM MED (Mesh General) IMPLANT
SUT VIC AB 2-0 CT2 27 (SUTURE) IMPLANT
SUT VIC AB 3-0 SH 27X BRD (SUTURE) IMPLANT
SUT VIC AB 3-0 X1 27 (SUTURE) IMPLANT
TRAY PACEMAKER INSERTION (PACKS) ×1 IMPLANT

## 2023-12-09 NOTE — H&P (Signed)
  Electrophysiology Office Note:   Date:  12/09/2023  ID:  Cassandra Romero, DOB 10/24/1932, MRN [id number]  Primary Cardiologist: Lamar Fitch, MD Primary Heart Failure: None Electrophysiologist: Devika Dragovich Cassandra Norton, MD       History of Present Illness:   Cassandra Romero is a 89 y.o. female with h/o SSS/Tachy-Brady s/p PPM, HFrEF, AF, AFL, HTN, GERD, CKD seen today for routine electrophysiology followup.   Since last being seen in our clinic the patient reports doing well overall. She is the primary caretaker of her husband who has dementia.  She is concerned about having a procedure and needing to care for him in the post op period. They have support from friends.     Today, denies symptoms of palpitations, chest pain, dyspnea, orthopnea, PND, lower extremity edema, claudication, dizziness, presyncope, syncope, bleeding, or neurologic sequela. The patient is tolerating medications without difficulties. Plan generator change today.    EP Information / Studies Reviewed:    EKG is ordered today. Personal review as below.      PPM Interrogation-  reviewed in detail today,  See PACEART report.  Device History: Medtronic Dual Chamber PPM implanted 12/30/2008 for Sinus Node Dysfunction  Studies:  ECHO 02/2023 > LVEF 60-65%, G2DD  Arrhythmia / AAD AF  AFL     Risk Assessment/Calculations:    CHA2DS2-VASc Score = 6   This indicates a 9.7% annual risk of stroke. The patient's score is based upon: CHF History: 1 HTN History: 1 Diabetes History: 1 Stroke History: 0 Vascular Disease History: 0 Age Score: 2 Gender Score: 1            Physical Exam:   VS:  BP (!) 160/90   Pulse 74   Temp 97.6 F (36.4 C) (Oral)   Resp 12   Ht 5' 4 (1.626 m)   Wt 56.7 kg   SpO2 93%   BMI 21.46 kg/m    Wt Readings from Last 3 Encounters:  12/09/23 56.7 kg  11/05/23 58.6 kg  06/17/23 53.5 kg    GEN: No acute distress.   Neck: No JVD Cardiac: RRR, no murmurs, rubs, or gallops.   Respiratory: normal BS bilaterally. GI: Soft, nontender, non-distended  MS: No edema; No deformity. Neuro:  Nonfocal  Skin: warm and dry Psych: Normal affect    ASSESSMENT AND PLAN:    SND s/p Medtronic PPM  Cassandra Romero has presented today for surgery, with the diagnosis of pacemaker ERI.  The various methods of treatment have been discussed with the patient and family. After consideration of risks, benefits and other options for treatment, the patient has consented to  Procedure(s): Pacemaker generator change as a surgical intervention .  Risks include but not limited to bleeding, infection, pneumothorax, perforation, tamponade, vascular damage, renal failure, MI, stroke, death, and lead dislodgement . The patient's history has been reviewed, patient examined, no change in status, stable for surgery.  I have reviewed the patient's chart and labs.  Questions were answered to the patient's satisfaction.    Nicolaus Andel Norton, MD 12/09/2023 9:08 AM

## 2023-12-09 NOTE — Discharge Instructions (Addendum)
 Implantable Cardiac Device Battery Change, Care After  This sheet gives you information about how to care for yourself after your procedure. Your health care provider may also give you more specific instructions. If you have problems or questions, contact your health care provider. What can I expect after the procedure? After your procedure, it is common to have: Pain or soreness at the site where the cardiac device was inserted. Swelling at the site where the cardiac device was inserted. You should received an information card for your new device in 4-8 weeks. Follow these instructions at home: Incision care  Keep the incision clean and dry. Do not take baths, swim, or use a hot tub until after your wound check.  Do not shower for at least 7 days, or as directed by your health care provider. Pat the area dry with a clean towel. Do not rub the area. This may cause bleeding. Follow instructions from your health care provider about how to take care of your incision. Make sure you: If your incision is sealed with Steri-strips or staples, you may shower 7 days after your procedure or when told by your provider. Do not remove the steri-strips or let the shower hit directly on your site. You may wash around your site with soap and water.  Check your incision area every day for signs of infection. Check for: More redness, swelling, or pain. More fluid or blood. Warmth. Pus or a bad smell. Activity Do not lift anything that is heavier than 10 lb (4.5 kg) until your health care provider says it is okay to do so. For the first week, or as long as told by your health care provider: Avoid lifting your affected arm higher than your shoulder. After 1 week, Be gentle when you move your arms over your head. It is okay to raise your arm to comb your hair. Avoid strenuous exercise. Ask your health care provider when it is okay to: Resume your normal activities. Return to work or school. Resume sexual  activity. Eating and drinking Eat a heart-healthy diet. This should include plenty of fresh fruits and vegetables, whole grains, low-fat dairy products, and lean protein like chicken and fish. Limit alcohol intake to no more than 1 drink a day for non-pregnant women and 2 drinks a day for men. One drink equals 12 oz of beer, 5 oz of wine, or 1 oz of hard liquor. Check ingredients and nutrition facts on packaged foods and beverages. Avoid the following types of food: Food that is high in salt (sodium). Food that is high in saturated fat, like full-fat dairy or red meat. Food that is high in trans fat, like fried food. Food and drinks that are high in sugar. Lifestyle Do not use any products that contain nicotine or tobacco, such as cigarettes and e-cigarettes. If you need help quitting, ask your health care provider. Take steps to manage and control your weight. Once cleared, get regular exercise. Aim for 150 minutes of moderate-intensity exercise (such as walking or yoga) or 75 minutes of vigorous exercise (such as running or swimming) each week. Manage other health problems, such as diabetes or high blood pressure. Ask your health care provider how you can manage these conditions. General instructions Do not drive for 24 hours after your procedure if you were given a medicine to help you relax (sedative). Take over-the-counter and prescription medicines only as told by your health care provider. Avoid putting pressure on the area where the cardiac device was  placed. If you need an MRI after your cardiac device has been placed, be sure to tell the health care provider who orders the MRI that you have a cardiac device. Avoid close and prolonged exposure to electrical devices that have strong magnetic fields. These include: Cell phones. Avoid keeping them in a pocket near the cardiac device, and try using the ear opposite the cardiac device. MP3 players. Household appliances, like  microwaves. Metal detectors. Electric generators. High-tension wires. Keep all follow-up visits as directed by your health care provider. This is important. Contact a health care provider if: You have pain at the incision site that is not relieved by over-the-counter or prescription medicines. You have any of these around your incision site or coming from it: More redness, swelling, or pain. Fluid or blood. Warmth to the touch. Pus or a bad smell. You have a fever. You feel brief, occasional palpitations, light-headedness, or any symptoms that you think might be related to your heart. Get help right away if: You experience chest pain that is different from the pain at the cardiac device site. You develop a red streak that extends above or below the incision site. You experience shortness of breath. You have palpitations or an irregular heartbeat. You have light-headedness that does not go away quickly. You faint or have dizzy spells. Your pulse suddenly drops or increases rapidly and does not return to normal. You begin to gain weight and your legs and ankles swell. Summary After your procedure, it is common to have pain, soreness, and some swelling where the cardiac device was inserted. Make sure to keep your incision clean and dry. Follow instructions from your health care provider about how to take care of your incision. Check your incision every day for signs of infection, such as more pain or swelling, pus or a bad smell, warmth, or leaking fluid and blood. Avoid strenuous exercise and lifting your left arm higher than your shoulder for 2 weeks, or as long as told by your health care provider. This information is not intended to replace advice given to you by your health care provider. Make sure you discuss any questions you have with your health care provider. Pacemaker Battery Change, Care After This sheet gives you information about how to care for yourself after your procedure.  Your health care provider may also give you more specific instructions. If you have problems or questions, contact your health care provider. What can I expect after the procedure? After the procedure, it is common to have these symptoms at the site where the pacemaker was inserted: Mild pain or soreness. Slight bruising. Some swelling over the incisions. A slight bump over the skin where the device was placed (if it was implanted in the upper chest area). Sometimes, it is possible to feel the device under the skin. This is normal. Follow these instructions at home: Incision care  Keep the incision clean and dry for 2-3 days after the procedure or as told by your health care provider. It takes several weeks for the incision site to completely heal. Do not remove the bandage (dressing) on your chest until told to do so by your health care provider. Leave stitches (sutures), skin glue, or adhesive strips in place. These skin closures may need to stay in place for 2 weeks or longer. If adhesive strip edges start to loosen and curl up, you may trim the loose edges. Do not remove adhesive strips completely unless your health care provider tells you to  do that. You may shower in 2 days Pat the incision area dry with a clean towel. Do not rub the area. This may cause bleeding. Check your incision area every day for signs of infection. Check for: More redness, swelling, or pain. Fluid or blood. Warmth. Pus or a bad smell. Avoid putting pressure on the area where the pacemaker was placed. Women may want to place a small pad over the incision site to protect it from their bra strap. Avoid using lotion on skin until insertion site heals completely Medicines Take over-the-counter and prescription medicines only as told by your health care provider. If you were prescribed an antibiotic medicine, take it as told by your health care provider. Do not stop taking the antibiotic even if you start to feel  better. Activity For the first 2 weeks, or as long as told by your health care provider: Avoid lifting anything heavy Avoid exercise or activities that take a lot of effort. If you were given a medicine to help you relax (sedative) during the procedure, it can affect you for several hours. Do not drive or operate machinery until your health care provider says that it is safe. General instructions Do not use any products that contain nicotine or tobacco, such as cigarettes, e-cigarettes, and chewing tobacco. These can delay incision healing after surgery. If you need help quitting, ask your health care provider. Always let all health care providers, including dentists, know about your pacemaker before you have any medical procedures or tests. You may be shown how to transfer data from your pacemaker through the phone to your health care provider. Wear a medical ID bracelet or necklace stating that you have a pacemaker, and carry a pacemaker ID card with you at all times. Avoid close and prolonged exposure to electrical devices that have strong magnetic fields. These include: Insurance claims handler. When at the airport, let officials know that you have a pacemaker. Carry your pacemaker ID card. Metal detectors. If you must pass through a metal detector, walk through it quickly. Do not stop under the detector or stand near it. When using your mobile phone, hold it to the ear opposite the pacemaker. Do not leave your mobile phone in a pocket over the pacemaker. Your pacemaker battery will last for 5-15 years. Your health care provider will do routine checks to know when the battery is starting to run down. When this happens, the pacemaker will need to be replaced. Keep all follow-up visits as told by your health care provider. This is important. Contact a health care provider if: You have pain at the incision site that is not relieved by medicines. You have any of these signs of infection: More  redness, swelling, or pain around your incision. Fluid or blood coming from your incision. Warmth coming from your incision. Pus or a bad smell coming from your incision. A fever. You feel brief, occasional palpitations, light-headedness, or any symptoms that you think might be related to your heart. Get help right away if: You have chest pain that is different from the pain at the pacemaker site. You develop a red streak that extends above or below the incision site. You have shortness of breath. You have palpitations or an irregular heartbeat. You have light-headedness that does not go away quickly. You faint or have dizzy spells. Your pulse suddenly drops or increases rapidly and does not return to normal. You gain weight and your legs and ankles swell. Summary After the procedure, it is  common to have pain, soreness, and some swelling or bruising where the pacemaker was inserted. Keep your incision clean and dry. Follow instructions from your health care provider about how to take care of your incision. Check your incision every day for signs of infection, such as more pain or swelling, pus or a bad smell, warmth, or leaking fluid or blood. Carry a pacemaker ID card with you at all times. This information is not intended to replace advice given to you by your health care provider. Make sure you discuss any questions you have with your health care provider. Document Revised: 04/23/2019 Document Reviewed: 04/23/2019 Elsevier Patient Education  2023 [employer].

## 2023-12-11 ENCOUNTER — Telehealth: Payer: Self-pay | Admitting: Cardiology

## 2023-12-11 NOTE — Telephone Encounter (Signed)
 LM to discuss with patient and determine next steps.

## 2023-12-11 NOTE — Telephone Encounter (Signed)
 Pt had battery changed in pacemaker and the tape around the incision is irritating her skin. She wants to know if she can come in today and have us  replace the tape.Please advise

## 2023-12-11 NOTE — Telephone Encounter (Signed)
 Patient called back.  She still hasn't removed the outer bandage and its causing some irritation.  She will go today to the [facility] clinic and nurse there will assist with outer removal and leave strips in place underneath - as she is afraid to do it herself at home.   If still itching - instructions given can use 1% hydrocortisone cream to areas of rash/itch, but cannot get that near the strips underneath or the incision area.  Patient verbalizes understanding.

## 2023-12-17 NOTE — Addendum Note (Signed)
 Addended by: VICCI SELLER A on: 12/17/2023 09:54 AM   Modules accepted: Orders

## 2023-12-17 NOTE — Progress Notes (Signed)
 Remote pacemaker transmission.

## 2023-12-24 ENCOUNTER — Ambulatory Visit: Attending: Cardiology

## 2023-12-24 ENCOUNTER — Ambulatory Visit: Admitting: Gastroenterology

## 2023-12-24 DIAGNOSIS — I495 Sick sinus syndrome: Secondary | ICD-10-CM

## 2023-12-24 LAB — CUP PACEART INCLINIC DEVICE CHECK
Date Time Interrogation Session: 20250722151328
Implantable Lead Connection Status: 753985
Implantable Lead Connection Status: 753985
Implantable Lead Implant Date: 20100729
Implantable Lead Implant Date: 20100729
Implantable Lead Location: 753859
Implantable Lead Location: 753860
Implantable Lead Model: 5076
Implantable Lead Model: 5076
Implantable Pulse Generator Implant Date: 20250707

## 2023-12-24 NOTE — Progress Notes (Signed)
 Normal dual chamber pacemaker wound check. Presenting rhythm: AP/VS 77 . Wound well healed. Routine testing performed. Thresholds, sensing, and impedances consistent with implant measurements. 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  Pt enrolled in remote follow-up.

## 2023-12-24 NOTE — Patient Instructions (Signed)

## 2023-12-30 ENCOUNTER — Ambulatory Visit: Payer: Self-pay | Admitting: Cardiology

## 2024-01-02 ENCOUNTER — Encounter: Payer: Self-pay | Admitting: Cardiology

## 2024-01-02 ENCOUNTER — Ambulatory Visit: Attending: Cardiology | Admitting: Cardiology

## 2024-01-02 VITALS — BP 122/80 | HR 77 | Ht 64.0 in | Wt 130.4 lb

## 2024-01-02 DIAGNOSIS — I420 Dilated cardiomyopathy: Secondary | ICD-10-CM

## 2024-01-02 DIAGNOSIS — R0609 Other forms of dyspnea: Secondary | ICD-10-CM

## 2024-01-02 DIAGNOSIS — I480 Paroxysmal atrial fibrillation: Secondary | ICD-10-CM

## 2024-01-02 DIAGNOSIS — D151 Benign neoplasm of heart: Secondary | ICD-10-CM

## 2024-01-02 NOTE — Patient Instructions (Signed)

## 2024-01-02 NOTE — Progress Notes (Signed)
 Cardiology Office Note:    Date:  01/02/2024   ID:  Cassandra Romero, DOB 07-18-1932, MRN [id number]  PCP:  Fernand Tracey LABOR, MD  Cardiologist:  Lamar Fitch, MD    Referring MD: Fernand Tracey LABOR, MD   No chief complaint on file.   History of Present Illness:    Cassandra Romero is a 89 y.o. female past medical history significant for myxoma s/p surgery many years ago, dual-chamber pacemaker implanted initially in 2010, status post recent replacement, essential hypertension, dyslipidemia paroxysmal atrial fibrillation.  Comes today 2 months of follow-up cardiac wise doing well the biggest complaint is the fact that she has sick husband with dementia and she has to take care of him but otherwise can walk climb stairs with no difficulties she did notice of her swelling of lower extremities as well as her hands lately which concerns her.  On the physical exam I do not see any blood.  Past Medical History:  Diagnosis Date   Acute decompensated heart failure (HCC) 06/27/2021   Acute pulmonary edema (HCC)    Acute systolic CHF (congestive heart failure) (HCC) 07/09/2021   AKI (acute kidney injury) (HCC)    Atrial fibrillation (HCC) 01/27/2009   Centricity Description: BRADYCARDIA Qualifier: Diagnosis of  By: Rolan Hough   Centricity Description: ATRIAL ARRHYTHMIAS Qualifier: Diagnosis of  By: Rolan Hough     Atrial flutter with rapid ventricular response (HCC)    Atrial myxoma    s/p resection   ATRIAL MYXOMA 01/27/2009   Qualifier: Diagnosis of  By: Rolan Hough     Cardiac pacemaker in situ    Chronic kidney disease    kidney tumor   Chronic kidney disease    kidney tumor   Congestive heart failure (HCC) 06/27/2021   Dilated cardiomyopathy (HCC) ejection fraction 25 to 30% in 2023 07/14/2021   Discharge from left nipple 09/19/2015   Disorder of bone and cartilage 01/27/2009   Qualifier: Diagnosis of   By: Rolan Hough      IMO SNOMED Dx Update Oct 2024     Essential  hypertension 07/20/2010   Qualifier: Diagnosis of  By: Kelsie, MD, James     Fibrocystic breast changes of both breasts 09/19/2015   GERD 01/27/2009   Qualifier: Diagnosis of  By: Rolan Hough     Hypotension 07/12/2021   Inversion of nipple 12/11/2021   Neoplasm of uncertain behavior of skin 04/15/2018   Neurofibroma of back 03/10/2019   OSTEOPENIA 01/27/2009   Qualifier: Diagnosis of  By: Rolan Hough     Paroxysmal atrial fibrillation (HCC)    Pleural effusion on left 07/12/2021   Renal benign neoplasm, right 09/28/2020   Right renal mass    Seasonal allergies 09/10/2020   nonproductive cough   SICK SINUS/ TACHY-BRADY SYNDROME 01/27/2009   Qualifier: Diagnosis of  By: Rolan Hough     Unspecified open wound, right lower leg, sequela 04/16/2016   URI (upper respiratory infection) 09/07/2020   WITH COLD AND COUGH , TOOK 5 DAYS OF ANTIBIOTICS ALL ISSUES RESOLVED PER PT   [place] 06/13/2020   Wears glasses    Wears hearing aid in both ears     Past Surgical History:  Procedure Laterality Date   APPENDECTOMY  1954   OPEN   ATRIAL MYXOMA EXCISION  12/24/2008   Left -- Dr Dusty   BREAST SURGERY  1 ON RIGHT 2 ON LEFT   BIOPSY LAST DONE 1973, AND X 2 1960'S   CARDIOVERSION N/A  07/11/2021   Procedure: CARDIOVERSION;  Surgeon: Raford Riggs, MD;  Location: [facility] ENDOSCOPY;  Service: Cardiovascular;  Laterality: N/A;   CHOLECYSTECTOMY  2010   LAPAPROSCIPIC   CYSTOSCOPY W/ URETERAL STENT PLACEMENT Right 09/27/2020   Procedure: CYSTOSCOPY WITH RETROGRADE PYELOGRAM/URETERAL STENT PLACEMENT;  Surgeon: Devere Lonni Righter, MD;  Location: [facility];  Service: Urology;  Laterality: Right;   IR RADIOLOGIST EVAL & MGMT  08/03/2020   IR RADIOLOGIST EVAL & MGMT  03/07/2021   IR RADIOLOGIST EVAL & MGMT  10/09/2021   PACEMAKER INSERTION  02/02/2009   PARTIAL HYSTERECTOMY  1973   STILL HAS OVARIES   PPM GENERATOR CHANGEOUT N/A 12/09/2023   Procedure: PPM GENERATOR  CHANGEOUT;  Surgeon: Inocencio Soyla Lunger, MD;  Location: [facility] INVASIVE CV LAB;  Service: Cardiovascular;  Laterality: N/A;   RADIOLOGY WITH ANESTHESIA Right 09/28/2020   Procedure: IR WITH ANESTHESIA CT CRYOABLATION;  Surgeon: Karalee Wilkie POUR, MD;  Location: [facility] ORS;  Service: Anesthesiology;  Laterality: Right;   stapendectomy Bilateral 1970's   TEE WITHOUT CARDIOVERSION N/A 07/11/2021   Procedure: TRANSESOPHAGEAL ECHOCARDIOGRAM (TEE);  Surgeon: Raford Riggs, MD;  Location: [facility] ENDOSCOPY;  Service: Cardiovascular;  Laterality: N/A;    Current Medications: Current Meds  Medication Sig   acetaminophen  (TYLENOL ) 500 MG tablet Take 500 mg by mouth every 6 (six) hours as needed for mild pain.   apixaban  (ELIQUIS ) 2.5 MG TABS tablet Take 1 tablet (2.5 mg total) by mouth 2 (two) times daily.   citalopram (CELEXA) 10 MG tablet Take 10 mg by mouth daily as needed (stress).   dicyclomine (BENTYL) 10 MG capsule Take 10 mg by mouth 3 (three) times daily as needed for spasms.   docusate sodium  (COLACE) 100 MG capsule Take 100 mg by mouth 2 (two) times daily.   famotidine  (PEPCID ) 20 MG tablet Take 20 mg by mouth 2 (two) times daily as needed for heartburn or indigestion.   furosemide  (LASIX ) 20 MG tablet Take 1 tablet (20 mg total) by mouth daily.     Allergies:   Aspirin, Alendronate sodium, Amiodarone hcl, Bactrim [sulfamethoxazole-trimethoprim], Entresto  [sacubitril-valsartan], Ibandronate sodium, Risedronate sodium, Statins, Vimovo [naproxen-esomeprazole mg], Xarelto  [rivaroxaban ], Metaxalone, Mobic [meloxicam], and Toprol  xl [metoprolol ]   Social History   Socioeconomic History   Marital status: Married    Spouse name: Not on file   Number of children: Not on file   Years of education: Not on file   Highest education level: Not on file  Occupational History   Occupation: retired Runner, broadcasting/film/video  Tobacco Use   Smoking status: Former    Current packs/day: 0.00    Average packs/day: 0.3  packs/day for 12.0 years (3.0 ttl pk-yrs)    Types: Cigarettes    Start date: 06/04/1976    Quit date: 06/04/1988    Years since quitting: 35.6   Smokeless tobacco: Never  Vaping Use   Vaping status: Never Used  Substance and Sexual Activity   Alcohol use: Not Currently   Drug use: No   Sexual activity: Not on file  Other Topics Concern   Not on file  Social History Narrative   Lives in [place]   Social Drivers of Health   Financial Resource Strain: Not on file  Food Insecurity: Low Risk  (11/18/2023)   Received from [facility]   Hunger Vital Sign    Within the past 12 months, you worried that your food would run out before you got money to buy more: Never true    Within  the past 12 months, the food you bought just didn't last and you didn't have money to get more. : Never true  Transportation Needs: No Transportation Needs (11/18/2023)   Received from [facility]    In the past 12 months, has lack of reliable transportation kept you from medical appointments, meetings, work or from getting things needed for daily living? : No  Physical Activity: Not on file  Stress: Not on file  Social Connections: Not on file     Family History: The patient's family history includes Coronary artery disease in an other family member; Dementia in her mother and another family member; Heart attack (age of onset: 27) in her father; Heart disease in her father; Pancreatic cancer in her mother and another family member; Thyroid disease in her mother; Transient ischemic attack in her mother. ROS:   Please see the history of present illness.    All 14 point review of systems negative except as described per history of present illness  EKGs/Labs/Other Studies Reviewed:    EKG Interpretation Date/Time:  Thursday January 02 2024 14:04:25 EDT Ventricular Rate:  77 PR Interval:  138 QRS Duration:  90 QT Interval:  404 QTC Calculation: 457 R Axis:   32  Text Interpretation: Normal  sinus rhythm RSR' or QR pattern in V1 suggests right ventricular conduction delay Borderline ECG When compared with ECG of 05-Nov-2023 14:45, Sinus rhythm has replaced Electronic ventricular pacemaker Confirmed by Bernie Charleston [phone]) on 01/02/2024 2:08:44 PM    Recent Labs: 11/11/2023: BUN 14; Creatinine, Ser 0.84; Hemoglobin 12.4; Platelets 271; Potassium 3.9; Sodium 144  Recent Lipid Panel    Component Value Date/Time   CHOL  12/22/2008 0150    146        ATP III CLASSIFICATION:  <200     mg/dL   Desirable  799-760  mg/dL   Borderline High  >=759    mg/dL   High          TRIG 83 12/22/2008 0150   HDL 36 (L) 12/22/2008 0150   CHOLHDL 4.1 12/22/2008 0150   VLDL 17 12/22/2008 0150   LDLCALC  12/22/2008 0150    93        Total Cholesterol/HDL:CHD Risk Coronary Heart Disease Risk Table                     Men   Women  1/2 Average Risk   3.4   3.3  Average Risk       5.0   4.4  2 X Average Risk   9.6   7.1  3 X Average Risk  23.4   11.0        Use the calculated Patient Ratio above and the CHD Risk Table to determine the patient's CHD Risk.        ATP III CLASSIFICATION (LDL):  <100     mg/dL   Optimal  899-870  mg/dL   Near or Above                    Optimal  130-159  mg/dL   Borderline  839-810  mg/dL   High  >809     mg/dL   Very High    Physical Exam:    VS:  BP 122/80 (BP Location: Left Arm, Patient Position: Sitting)   Pulse 77   Ht 5' 4 (1.626 m)   Wt 130 lb 6.4 oz (59.1 kg)   SpO2  93%   BMI 22.38 kg/m     Wt Readings from Last 3 Encounters:  01/02/24 130 lb 6.4 oz (59.1 kg)  12/09/23 125 lb (56.7 kg)  11/05/23 129 lb 3.2 oz (58.6 kg)     GEN:  Well nourished, well developed in no acute distress HEENT: Normal NECK: No JVD; No carotid bruits LYMPHATICS: No lymphadenopathy CARDIAC: RRR, no murmurs, no rubs, no gallops RESPIRATORY:  Clear to auscultation without rales, wheezing or rhonchi  ABDOMEN: Soft, non-tender, non-distended MUSCULOSKELETAL:   No edema; No deformity  SKIN: Warm and dry LOWER EXTREMITIES: no swelling NEUROLOGIC:  Alert and oriented x 3 PSYCHIATRIC:  Normal affect   ASSESSMENT:    1. Paroxysmal atrial fibrillation (HCC)   2. Dyspnea on exertion   3. Atrial myxoma s/p resection done more than 20 years ago   4. Dilated cardiomyopathy (HCC) ejection fraction 25 to 30% in 2023    PLAN:    In order of problems listed above:  Paroxysmal atrial fibrillation.  Maintain sinus rhythm.  Anticoagulant continue. Dyspnea exertion only mild.  Will schedule to have echocardiogram especially in view of the fact that she does have swelling of lower extremities noted today but sometimes on the evening time. History of dilated cardiomyopathy, some medication has been withdrawn because of orthostatic hypotension and falls but few Present ejection fraction so we will do another echocardiogram to recheck it. History of myxoma removed 20 years ago.  Stable   Medication Adjustments/Labs and Tests Ordered: Current medicines are reviewed at length with the patient today.  Concerns regarding medicines are outlined above.  Orders Placed This Encounter  Procedures   EKG 12-Lead   ECHOCARDIOGRAM COMPLETE   Medication changes: No orders of the defined types were placed in this encounter.   Signed, Lamar DOROTHA Fitch, MD, [facility] 01/02/2024 2:23 PM    [facility]

## 2024-01-16 DIAGNOSIS — Z1339 Encounter for screening examination for other mental health and behavioral disorders: Secondary | ICD-10-CM | POA: Diagnosis not present

## 2024-01-16 DIAGNOSIS — Z9181 History of falling: Secondary | ICD-10-CM | POA: Diagnosis not present

## 2024-01-16 DIAGNOSIS — Z1331 Encounter for screening for depression: Secondary | ICD-10-CM | POA: Diagnosis not present

## 2024-01-16 DIAGNOSIS — Z0000 Encounter for general adult medical examination without abnormal findings: Secondary | ICD-10-CM | POA: Diagnosis not present

## 2024-01-16 DIAGNOSIS — G5603 Carpal tunnel syndrome, bilateral upper limbs: Secondary | ICD-10-CM | POA: Diagnosis not present

## 2024-01-16 DIAGNOSIS — Z6821 Body mass index (BMI) 21.0-21.9, adult: Secondary | ICD-10-CM | POA: Diagnosis not present

## 2024-01-21 ENCOUNTER — Ambulatory Visit: Attending: Cardiology

## 2024-01-21 ENCOUNTER — Ambulatory Visit: Payer: Medicare PPO

## 2024-01-21 DIAGNOSIS — R0609 Other forms of dyspnea: Secondary | ICD-10-CM | POA: Diagnosis not present

## 2024-01-21 LAB — ECHOCARDIOGRAM COMPLETE
Area-P 1/2: 3.75 cm2
MV M vel: 3.05 m/s
MV Peak grad: 37.2 mmHg
S' Lateral: 2.5 cm

## 2024-01-28 DIAGNOSIS — L821 Other seborrheic keratosis: Secondary | ICD-10-CM | POA: Diagnosis not present

## 2024-01-28 DIAGNOSIS — D485 Neoplasm of uncertain behavior of skin: Secondary | ICD-10-CM | POA: Diagnosis not present

## 2024-01-28 DIAGNOSIS — L570 Actinic keratosis: Secondary | ICD-10-CM | POA: Diagnosis not present

## 2024-01-29 ENCOUNTER — Ambulatory Visit: Payer: Self-pay | Admitting: Cardiology

## 2024-01-30 DIAGNOSIS — R194 Change in bowel habit: Secondary | ICD-10-CM | POA: Diagnosis not present

## 2024-01-30 DIAGNOSIS — I110 Hypertensive heart disease with heart failure: Secondary | ICD-10-CM | POA: Diagnosis not present

## 2024-01-30 DIAGNOSIS — I4891 Unspecified atrial fibrillation: Secondary | ICD-10-CM | POA: Diagnosis not present

## 2024-01-30 DIAGNOSIS — K921 Melena: Secondary | ICD-10-CM | POA: Diagnosis not present

## 2024-01-30 DIAGNOSIS — I509 Heart failure, unspecified: Secondary | ICD-10-CM | POA: Diagnosis not present

## 2024-01-30 DIAGNOSIS — D123 Benign neoplasm of transverse colon: Secondary | ICD-10-CM | POA: Diagnosis not present

## 2024-01-30 DIAGNOSIS — K635 Polyp of colon: Secondary | ICD-10-CM | POA: Diagnosis not present

## 2024-01-30 DIAGNOSIS — K5730 Diverticulosis of large intestine without perforation or abscess without bleeding: Secondary | ICD-10-CM | POA: Diagnosis not present

## 2024-01-30 DIAGNOSIS — K219 Gastro-esophageal reflux disease without esophagitis: Secondary | ICD-10-CM | POA: Diagnosis not present

## 2024-01-30 DIAGNOSIS — Z950 Presence of cardiac pacemaker: Secondary | ICD-10-CM | POA: Diagnosis not present

## 2024-01-30 DIAGNOSIS — I10 Essential (primary) hypertension: Secondary | ICD-10-CM | POA: Diagnosis not present

## 2024-01-31 ENCOUNTER — Other Ambulatory Visit (HOSPITAL_BASED_OUTPATIENT_CLINIC_OR_DEPARTMENT_OTHER): Payer: Self-pay

## 2024-01-31 ENCOUNTER — Encounter (HOSPITAL_BASED_OUTPATIENT_CLINIC_OR_DEPARTMENT_OTHER): Payer: Self-pay

## 2024-01-31 ENCOUNTER — Ambulatory Visit (HOSPITAL_BASED_OUTPATIENT_CLINIC_OR_DEPARTMENT_OTHER)
Admission: EM | Admit: 2024-01-31 | Discharge: 2024-01-31 | Disposition: A | Discharge status: Home / Self Care | Attending: Physician Assistant | Admitting: Physician Assistant

## 2024-01-31 ENCOUNTER — Ambulatory Visit (INDEPENDENT_AMBULATORY_CARE_PROVIDER_SITE_OTHER)
Admit: 2024-01-31 | Discharge: 2024-01-31 | Disposition: A | Discharge status: Home / Self Care | Attending: Physician Assistant | Admitting: Physician Assistant

## 2024-01-31 DIAGNOSIS — M25531 Pain in right wrist: Secondary | ICD-10-CM | POA: Diagnosis not present

## 2024-01-31 DIAGNOSIS — G5601 Carpal tunnel syndrome, right upper limb: Secondary | ICD-10-CM

## 2024-01-31 DIAGNOSIS — M25431 Effusion, right wrist: Secondary | ICD-10-CM | POA: Diagnosis not present

## 2024-01-31 DIAGNOSIS — M8580 Other specified disorders of bone density and structure, unspecified site: Secondary | ICD-10-CM | POA: Diagnosis not present

## 2024-01-31 DIAGNOSIS — M19041 Primary osteoarthritis, right hand: Secondary | ICD-10-CM | POA: Diagnosis not present

## 2024-01-31 MED ORDER — PREDNISONE 20 MG PO TABS
20.0000 mg | ORAL_TABLET | Freq: Every day | ORAL | 0 refills | Status: AC
Start: 2024-01-31 — End: 2024-02-05
  Filled 2024-01-31: qty 5, 5d supply, fill #0

## 2024-01-31 NOTE — ED Triage Notes (Signed)
 States dx with Carpal Tunnel approx 2 weeks ago. Having severe pain/swelling to right hand/wrist with radiation up toward forearm. Given a splint by pcp. Took 650mg  tylenol  this morning at 0930. Was told next step is steroids then possible surgery but patient is not open to that. Looking for pain management. Good radial pulse. Hand warm and dry.

## 2024-01-31 NOTE — Discharge Instructions (Signed)
 Your x-ray showed arthritis but no broken bones which is great news.  Continue using the brace as this will hopefully help with your pain.  We are going to start prednisone  daily for 5 days.  Do not take NSAIDs with this medication including aspirin, ibuprofen/Advil, naproxen/Aleve (you should not be taking them anyway because you are on Eliquis ).  You can use Tylenol /acetaminophen  for additional pain relief.  If your symptoms are not improving I recommend you follow-up with an orthopedic doctor; call to schedule an appointment.  If anything worsens and you have increasing pain, fever, nausea, vomiting, difficulty moving the hand, discoloration of the hand, numbness or tingling that is worsening or changing where it is located in the hand you should be seen immediately.

## 2024-01-31 NOTE — ED Provider Notes (Signed)
 [facility]    CSN: [id number] Arrival date & time: 01/31/24  1005      History   Chief Complaint Chief Complaint  Patient presents with   Wrist Pain    HPI SHAQUALA BROEKER is a 89 y.o. female.   Patient presents today with a several week history of significant right wrist pain.  She denies any recent falls or trauma.  She has been seen by her primary care who thought symptoms were related to carpal tunnel and so gave her a brace and she has been taking Tylenol  but this has been ineffective.  She reports that the pain is localized to the base of her thumb and she has numbness and tingling radiating into her thumb, index, middle finger.  She has never had surgery involving her wrist.  She is right-handed.  She does report repetitive motions including playing solitaire on her computer and wonders if this could have triggered her symptoms.  Pain is rated 7 on a 0-10 pain scale, described as sharp, worse with certain movements or palpation, no alleviating factors identified.    Past Medical History:  Diagnosis Date   Acute decompensated heart failure (HCC) 06/27/2021   Acute pulmonary edema (HCC)    Acute systolic CHF (congestive heart failure) (HCC) 07/09/2021   AKI (acute kidney injury) (HCC)    Atrial fibrillation (HCC) 01/27/2009   Centricity Description: BRADYCARDIA Qualifier: Diagnosis of  By: Rolan Hough   Centricity Description: ATRIAL ARRHYTHMIAS Qualifier: Diagnosis of  By: Rolan Hough     Atrial flutter with rapid ventricular response (HCC)    Atrial myxoma    s/p resection   ATRIAL MYXOMA 01/27/2009   Qualifier: Diagnosis of  By: Rolan Hough     Cardiac pacemaker in situ    Chronic kidney disease    kidney tumor   Chronic kidney disease    kidney tumor   Congestive heart failure (HCC) 06/27/2021   Dilated cardiomyopathy (HCC) ejection fraction 25 to 30% in 2023 07/14/2021   Discharge from left nipple 09/19/2015   Disorder of bone and cartilage  01/27/2009   Qualifier: Diagnosis of   By: Rolan Hough      IMO SNOMED Dx Update Oct 2024     Essential hypertension 07/20/2010   Qualifier: Diagnosis of  By: Kelsie, MD, James     Fibrocystic breast changes of both breasts 09/19/2015   GERD 01/27/2009   Qualifier: Diagnosis of  By: Rolan Hough     Hypotension 07/12/2021   Inversion of nipple 12/11/2021   Neoplasm of uncertain behavior of skin 04/15/2018   Neurofibroma of back 03/10/2019   OSTEOPENIA 01/27/2009   Qualifier: Diagnosis of  By: Rolan Hough     Paroxysmal atrial fibrillation (HCC)    Pleural effusion on left 07/12/2021   Renal benign neoplasm, right 09/28/2020   Right renal mass    Seasonal allergies 09/10/2020   nonproductive cough   SICK SINUS/ TACHY-BRADY SYNDROME 01/27/2009   Qualifier: Diagnosis of  By: Rolan Hough     Unspecified open wound, right lower leg, sequela 04/16/2016   URI (upper respiratory infection) 09/07/2020   WITH COLD AND COUGH , TOOK 5 DAYS OF ANTIBIOTICS ALL ISSUES RESOLVED PER PT   Verrucas 06/13/2020   Wears glasses    Wears hearing aid in both ears     Patient Active Problem List   Diagnosis Date Noted   Hematochezia 11/18/2023   Change in bowel habits 11/18/2023   Chronic kidney disease  Right renal mass    Wears glasses    Wears hearing aid in both ears    Inversion of nipple 12/11/2021   Dilated cardiomyopathy (HCC) ejection fraction 25 to 30% in 2023 07/14/2021   Atrial myxoma s/p resection done more than 20 years ago 07/14/2021   Pleural effusion on left 07/12/2021   Hypotension 07/12/2021   Acute systolic CHF (congestive heart failure) (HCC) 07/09/2021   Acute pulmonary edema (HCC)    Atrial flutter with rapid ventricular response (HCC)    AKI (acute kidney injury) (HCC)    Acute decompensated heart failure (HCC) 06/27/2021   Renal benign neoplasm, right 09/28/2020   Seasonal allergies 09/10/2020   URI (upper respiratory infection) 09/07/2020   Verrucas  06/13/2020   Neurofibroma of back 03/10/2019   Neoplasm of uncertain behavior of skin 04/15/2018   Unspecified open wound, right lower leg, sequela 04/16/2016   Fibrocystic breast changes of both breasts 09/19/2015   Discharge from left nipple 09/19/2015   Essential hypertension 07/20/2010   ATRIAL MYXOMA 01/27/2009   SICK SINUS/ TACHY-BRADY SYNDROME 01/27/2009   Atrial fibrillation (HCC) 01/27/2009   GERD 01/27/2009   Disorder of bone and cartilage 01/27/2009   AICD (automatic cardioverter/defibrillator) present 2010    Past Surgical History:  Procedure Laterality Date   APPENDECTOMY  1954   OPEN   ATRIAL MYXOMA EXCISION  12/24/2008   Left -- Dr Dusty   BREAST SURGERY  1 ON RIGHT 2 ON LEFT   BIOPSY LAST DONE 1973, AND X 2 1960'S   CARDIOVERSION N/A 07/11/2021   Procedure: CARDIOVERSION;  Surgeon: Raford Riggs, MD;  Location: [facility] ENDOSCOPY;  Service: Cardiovascular;  Laterality: N/A;   CHOLECYSTECTOMY  2010   LAPAPROSCIPIC   CYSTOSCOPY W/ URETERAL STENT PLACEMENT Right 09/27/2020   Procedure: CYSTOSCOPY WITH RETROGRADE PYELOGRAM/URETERAL STENT PLACEMENT;  Surgeon: Devere Lonni Righter, MD;  Location[facility];  Service: Urology;  Laterality: Right;   IR RADIOLOGIST EVAL & MGMT  08/03/2020   IR RADIOLOGIST EVAL & MGMT  03/07/2021   IR RADIOLOGIST EVAL & MGMT  10/09/2021   PACEMAKER INSERTION  02/02/2009   PARTIAL HYSTERECTOMY  1973   STILL HAS OVARIES   PPM GENERATOR CHANGEOUT N/A 12/09/2023   Procedure: PPM GENERATOR CHANGEOUT;  Surgeon: Inocencio Soyla Lunger, MD;  Location: [facility] INVASIVE CV LAB;  Service: Cardiovascular;  Laterality: N/A;   RADIOLOGY WITH ANESTHESIA Right 09/28/2020   Procedure: IR WITH ANESTHESIA CT CRYOABLATION;  Surgeon: Karalee Wilkie POUR, MD;  Location: [facility] ORS;  Service: Anesthesiology;  Laterality: Right;   stapendectomy Bilateral 1970's   TEE WITHOUT CARDIOVERSION N/A 07/11/2021   Procedure: TRANSESOPHAGEAL ECHOCARDIOGRAM  (TEE);  Surgeon: Raford Riggs, MD;  Location: [facility] ENDOSCOPY;  Service: Cardiovascular;  Laterality: N/A;    OB History   No obstetric history on file.      Home Medications    Prior to Admission medications   Medication Sig Start Date End Date Taking? Authorizing Provider  predniSONE  (DELTASONE ) 20 MG tablet Take 1 tablet (20 mg total) by mouth daily for 5 days. 01/31/24 02/05/24 Yes Scharlene Catalina K, PA-C  acetaminophen  (TYLENOL ) 500 MG tablet Take 500 mg by mouth every 6 (six) hours as needed for mild pain.    [provider]  apixaban  (ELIQUIS ) 2.5 MG TABS tablet Take 1 tablet (2.5 mg total) by mouth 2 (two) times daily. 07/26/23   Krasowski, Robert J, MD  citalopram (CELEXA) 10 MG tablet Take 10 mg by mouth daily as needed (stress).  01/29/23   [provider]  dicyclomine (BENTYL) 10 MG capsule Take 10 mg by mouth 3 (three) times daily as needed for spasms. 12/28/21   [provider]  docusate sodium  (COLACE) 100 MG capsule Take 100 mg by mouth 2 (two) times daily. 02/15/23   [provider]  famotidine  (PEPCID ) 20 MG tablet Take 20 mg by mouth 2 (two) times daily as needed for heartburn or indigestion.    [provider]  furosemide  (LASIX ) 20 MG tablet Take 1 tablet (20 mg total) by mouth daily. 08/26/23   Carlin Delon BROCKS, NP    Family History Family History  Problem Relation Age of Onset   Pancreatic cancer Mother    Transient ischemic attack Mother        numerous   Dementia Mother    Thyroid disease Mother    Heart disease Father    Heart attack Father 97   Pancreatic cancer Other    Dementia Other        parathyroid removal   Coronary artery disease Other     Social History Social History   Tobacco Use   Smoking status: Former    Current packs/day: 0.00    Average packs/day: 0.3 packs/day for 12.0 years (3.0 ttl pk-yrs)    Types: Cigarettes    Start date: 06/04/1976    Quit date: 06/04/1988    Years since quitting: 35.6    Smokeless tobacco: Never  Vaping Use   Vaping status: Never Used  Substance Use Topics   Alcohol use: Not Currently   Drug use: No     Allergies   Aspirin, Alendronate sodium, Amiodarone hcl, Bactrim [sulfamethoxazole-trimethoprim], Entresto  [sacubitril-valsartan], Ibandronate sodium, Risedronate sodium, Statins, Vimovo [naproxen-esomeprazole mg], Xarelto  [rivaroxaban ], Metaxalone, Mobic [meloxicam], and Toprol  xl [metoprolol ]   Review of Systems Review of Systems  Constitutional:  Positive for activity change. Negative for appetite change, fatigue and fever.  Gastrointestinal:  Negative for nausea and vomiting.  Musculoskeletal:  Positive for arthralgias and joint swelling. Negative for gait problem and myalgias.  Skin:  Negative for color change and wound.  Neurological:  Positive for numbness. Negative for weakness.     Physical Exam Triage Vital Signs ED Triage Vitals  Encounter Vitals Group     BP 01/31/24 1052 121/83     Girls Systolic BP Percentile --      Girls Diastolic BP Percentile --      Boys Systolic BP Percentile --      Boys Diastolic BP Percentile --      Pulse Rate 01/31/24 1052 72     Resp 01/31/24 1052 20     Temp 01/31/24 1052 98.3 F (36.8 C)     Temp Source 01/31/24 1052 Oral     SpO2 01/31/24 1052 92 %     Weight --      Height --      Head Circumference --      Peak Flow --      Pain Score 01/31/24 1056 7     Pain Loc --      Pain Education --      Exclude from Growth Chart --    No data found.  Updated Vital Signs BP 121/83 (BP Location: Left Arm)   Pulse 72   Temp 98.3 F (36.8 C) (Oral)   Resp 20   SpO2 92% Comment: Usually runs 93% per patient.  Visual Acuity Right Eye Distance:   Left Eye Distance:   Bilateral Distance:  Right Eye Near:   Left Eye Near:    Bilateral Near:     Physical Exam Vitals reviewed.  Constitutional:      General: She is awake. She is not in acute distress.    Appearance: Normal appearance.  She is well-developed. She is not ill-appearing.     Comments: Very pleasant female appears stated age in no acute distress sitting comfortably in exam room  HENT:     Head: Normocephalic and atraumatic.  Cardiovascular:     Rate and Rhythm: Normal rate and regular rhythm.     Heart sounds: Normal heart sounds, S1 normal and S2 normal. No murmur heard.    Comments: Capillary refill within 3 seconds right fingers Pulmonary:     Effort: Pulmonary effort is normal.     Breath sounds: Examination of the right-lower field reveals decreased breath sounds. Examination of the left-lower field reveals decreased breath sounds. Decreased breath sounds present. No wheezing, rhonchi or rales.  Musculoskeletal:     Right wrist: Swelling, tenderness and bony tenderness present. No deformity or snuff box tenderness. Decreased range of motion.     Right hand: No swelling, tenderness or bony tenderness. Normal range of motion. There is no disruption of two-point discrimination. Normal capillary refill.     Comments: Right wrist/hand: Significant tenderness palpation over distal radius and wrist without deformity.  No snuffbox tenderness.  Hand is neurovascularly intact though she does report subjective numbness in median nerve distribution.  No deformity noted.  Psychiatric:        Behavior: Behavior is cooperative.      UC Treatments / Results  Labs (all labs ordered are listed, but only abnormal results are displayed) Labs Reviewed - No data to display  EKG   Radiology DG Wrist Complete Right Result Date: 01/31/2024 CLINICAL DATA:  Severe right wrist pain and swelling radiating into the forearm. Diagnosed with carpal tunnel syndrome 2 weeks ago. EXAM: RIGHT WRIST - COMPLETE 3+ VIEW COMPARISON:  None Available. FINDINGS: Diffuse osteopenia. Moderate 1st metacarpal/carpal degenerative changes. Mild 1st MCP joint degenerative changes. Triangular fibrocartilage calcification. No fracture or dislocation.  IMPRESSION: 1. Moderate 1st metacarpal/carpal and mild 1st MCP joint degenerative changes. 2. Chondrocalcinosis. Electronically Signed   By: Elspeth Bathe M.D.   On: 01/31/2024 11:47    Procedures Procedures (including critical care time)  Medications Ordered in UC Medications - No data to display  Initial Impression / Assessment and Plan / UC Course  I have reviewed the triage vital signs and the nursing notes.  Pertinent labs & imaging results that were available during my care of the patient were reviewed by me and considered in my medical decision making (see chart for details).     Patient is well-appearing, afebrile, nontoxic, nontachycardic.  Given her bony tenderness x-ray was obtained that showed degenerative changes without acute osseous abnormality.  We discussed that her symptoms are most consistent with a combination of osteoarthritis and carpal tunnel.  She has failed over-the-counter analgesics that she is unable to take NSAIDs due to chronic anticoagulation and so we will try a short course of prednisone  20 mg daily.  She was encouraged to continue to the brace for comfort and support.  We discussed that if her symptoms persist she should follow-up with orthopedics as they may be able to provide more targeted therapy.  We discussed that if anything worsens or changes she needs to be seen immediately.  Strict return precautions given.  All questions answered to patient  satisfaction.  Final Clinical Impressions(s) / UC Diagnoses   Final diagnoses:  Right wrist pain  Carpal tunnel syndrome on right     Discharge Instructions      Your x-ray showed arthritis but no broken bones which is great news.  Continue using the brace as this will hopefully help with your pain.  We are going to start prednisone  daily for 5 days.  Do not take NSAIDs with this medication including aspirin, ibuprofen/Advil, naproxen/Aleve (you should not be taking them anyway because you are on Eliquis ).   You can use Tylenol /acetaminophen  for additional pain relief.  If your symptoms are not improving I recommend you follow-up with an orthopedic doctor; call to schedule an appointment.  If anything worsens and you have increasing pain, fever, nausea, vomiting, difficulty moving the hand, discoloration of the hand, numbness or tingling that is worsening or changing where it is located in the hand you should be seen immediately.     ED Prescriptions     Medication Sig Dispense Auth. Provider   predniSONE  (DELTASONE ) 20 MG tablet Take 1 tablet (20 mg total) by mouth daily for 5 days. 5 tablet Mack Alvidrez K, PA-C      PDMP not reviewed this encounter.   Sherrell Rocky POUR, PA-C 01/31/24 1159

## 2024-02-10 ENCOUNTER — Ambulatory Visit: Payer: Self-pay | Admitting: Cardiology

## 2024-02-10 ENCOUNTER — Ambulatory Visit

## 2024-02-10 ENCOUNTER — Telehealth: Payer: Self-pay | Admitting: Cardiology

## 2024-02-10 DIAGNOSIS — I420 Dilated cardiomyopathy: Secondary | ICD-10-CM

## 2024-02-10 LAB — CUP PACEART REMOTE DEVICE CHECK
Battery Remaining Longevity: 164 mo
Battery Voltage: 3.22 V
Brady Statistic AP VP Percent: 0.03 %
Brady Statistic AP VS Percent: 75.84 %
Brady Statistic AS VP Percent: 0 %
Brady Statistic AS VS Percent: 24.13 %
Brady Statistic RA Percent Paced: 75.51 %
Brady Statistic RV Percent Paced: 0.04 %
Date Time Interrogation Session: 20250906204921
Implantable Lead Connection Status: 753985
Implantable Lead Connection Status: 753985
Implantable Lead Implant Date: 20100729
Implantable Lead Implant Date: 20100729
Implantable Lead Location: 753859
Implantable Lead Location: 753860
Implantable Lead Model: 5076
Implantable Lead Model: 5076
Implantable Pulse Generator Implant Date: 20250707
Lead Channel Impedance Value: 285 Ohm
Lead Channel Impedance Value: 304 Ohm
Lead Channel Impedance Value: 437 Ohm
Lead Channel Impedance Value: 437 Ohm
Lead Channel Pacing Threshold Amplitude: 0.75 V
Lead Channel Pacing Threshold Amplitude: 1.125 V
Lead Channel Pacing Threshold Pulse Width: 0.4 ms
Lead Channel Pacing Threshold Pulse Width: 0.4 ms
Lead Channel Sensing Intrinsic Amplitude: 1.625 mV
Lead Channel Sensing Intrinsic Amplitude: 1.625 mV
Lead Channel Sensing Intrinsic Amplitude: 5.125 mV
Lead Channel Sensing Intrinsic Amplitude: 5.125 mV
Lead Channel Setting Pacing Amplitude: 1.75 V
Lead Channel Setting Pacing Amplitude: 2.25 V
Lead Channel Setting Pacing Pulse Width: 0.4 ms
Lead Channel Setting Sensing Sensitivity: 0.9 mV
Zone Setting Status: 755011
Zone Setting Status: 755011

## 2024-02-10 NOTE — Telephone Encounter (Signed)
 Additional alert received that was received 02/08/24:  PPM Scheduled remote reviewed. Normal device function.  Presenting rhythm: AF/VS (alert for AF) 6 AF events, current started on 9/6.  Longest x 5 hr 24 min on 9/6.  On OAC per EMR.  To triage due to presenting with AF.  2 NSVT, both x 1 sec, V-rates 162-188 bpm.  1:1 AV and 6 beats V>A.  __________________________________________________________________________  Will await a call back from the patient to assess if any symptoms.

## 2024-02-10 NOTE — Telephone Encounter (Signed)
 Alert remote transmission:  Avg. V. Rate During AT/AF > Threshold AFL in progress from 02/08/24, poor rate control, Eliquis  per EPIC - route to triage __________________________________________________________________________  Attempted to contact the patient to confirm if she was/ is currently having any symptoms with elevated heart rates.  No answer-  I left a message to please call back.

## 2024-02-11 ENCOUNTER — Ambulatory Visit: Admitting: Cardiology

## 2024-02-11 NOTE — Telephone Encounter (Signed)
 Hx of prox. AF and on OAC. Another transmission scheduled in 1 week to see if back in NSR.

## 2024-02-18 ENCOUNTER — Encounter

## 2024-02-19 NOTE — Telephone Encounter (Signed)
 Patient returned call to clinic. Patient states the last 2 weeks have been a stressful time as her husband w/ dementia has been hospitalized and they were just recently discharged from the hospital today. States that she feels fine overall, just stressed out. Complaint of feeling fluttering every now and then. Notices this type of feeling more when getting up in the morning.   Patient willing to see AF clinic or provider for rhythm control. States she is unable to come into clinic this week. Patient requests to be seen in [place] if possible, as that is where she resides.   Device clinic number given if patient has any further questions or if symptoms worsen.   Will forward to scheduling team in [place] to schedule F/U appointment. Will continue to monitor and update accordingly. Patient appreciative for call.

## 2024-02-19 NOTE — Telephone Encounter (Signed)
 Unscheduled remote transmission:  Scheduled non-billable, recheck for AF Normal device function.  AF in progress from 9/13, poor rate control, Eliquis  per EPIC   ______________________________________________________________________  Remote transmission received to recheck presenting rhythm & AF burden. Received and reviewed. Presenting rhythm AF w/ poor rate control. Hx AF noted. On OAC per EPIC. AT/AF burden 100%.   Left voicemail requesting a call back. Unable to assess symptoms at this time. Will continue to monitor and update accordingly.

## 2024-02-19 NOTE — Telephone Encounter (Signed)
 Left message for the patient to call back.

## 2024-02-19 NOTE — Telephone Encounter (Signed)
 Spoke to Dr. Inocencio nurse [place] and she recommended the following:  I would schedule w/ EP or afib clinic. I do not think [facility] has anything there anytime soon, great if so. But pt may have to come to [place] and see afib clinic who could get her in the next week/two

## 2024-02-20 NOTE — Telephone Encounter (Signed)
 Patient adamant to only be seen in [place] location due to travel and caring for sick spouse. Will forward back to [facility] for follow up .

## 2024-02-20 NOTE — Progress Notes (Signed)
 Remote PPM Transmission

## 2024-02-20 NOTE — Telephone Encounter (Signed)
 Forwarding to AFib clinic to arrange an appt.  (Pt aware someone will be in touch to schedule

## 2024-02-21 ENCOUNTER — Other Ambulatory Visit: Payer: Self-pay | Admitting: Cardiology

## 2024-02-21 DIAGNOSIS — I480 Paroxysmal atrial fibrillation: Secondary | ICD-10-CM

## 2024-02-21 NOTE — Telephone Encounter (Signed)
 Late Entry:  Spoke to pt this morning.  She is not able to travel to [place] at this time.  Aware that Dr. Inocencio does not have any opening there for quite a while.  Aware I will discuss with him if possible for someone to see her in [place], her general cardiologist is Dr. Krasowski. Pt is scheduled for routine post gen change follow up on 10/20.  She is asking if it can/safe to wait until that appt.  She also wants MD to know that she is under a tremendous amt of stress at this time and look for placement for her husband.  Aware will discuss with Dr. Inocencio next week and let her know.  She is agreeable.  She will call over weekend if any issues.

## 2024-02-21 NOTE — Telephone Encounter (Signed)
 Prescription refill request for Eliquis  received. Indication:AFIB Last office visit:7/25 Scr:0.84  6/25 Age: 89 Weight:59.1  kg  Prescription refilled

## 2024-02-24 NOTE — Telephone Encounter (Signed)
 Advised pt of MD recommendation to start Toprol  50 mg daily at bedtime.  Patient agreeable to plan and advised to call the office if SE occur after starting it.    However pt has allergy to Metoprolol .... I thought that name sounded familiar.  Aware forwarding to MD for updated advisement.

## 2024-02-25 MED ORDER — DILTIAZEM HCL ER COATED BEADS 180 MG PO CP24: 180.0000 mg | ORAL_CAPSULE | Freq: Every day | ORAL | 6 refills | Status: DC

## 2024-02-25 NOTE — Telephone Encounter (Signed)
 Pt advised,  agreeable to plan. Advised to call office if SE begin after new medication start and/or not effective. Patient verbalized understanding and agreeable to plan.

## 2024-03-06 DIAGNOSIS — K219 Gastro-esophageal reflux disease without esophagitis: Secondary | ICD-10-CM | POA: Diagnosis not present

## 2024-03-06 DIAGNOSIS — I441 Atrioventricular block, second degree: Secondary | ICD-10-CM | POA: Diagnosis not present

## 2024-03-06 DIAGNOSIS — I4892 Unspecified atrial flutter: Secondary | ICD-10-CM | POA: Diagnosis not present

## 2024-03-06 DIAGNOSIS — I4891 Unspecified atrial fibrillation: Secondary | ICD-10-CM | POA: Diagnosis not present

## 2024-03-06 DIAGNOSIS — E7800 Pure hypercholesterolemia, unspecified: Secondary | ICD-10-CM | POA: Diagnosis not present

## 2024-03-06 DIAGNOSIS — M810 Age-related osteoporosis without current pathological fracture: Secondary | ICD-10-CM | POA: Diagnosis not present

## 2024-03-06 DIAGNOSIS — D72821 Monocytosis (symptomatic): Secondary | ICD-10-CM | POA: Diagnosis not present

## 2024-03-06 DIAGNOSIS — I5021 Acute systolic (congestive) heart failure: Secondary | ICD-10-CM | POA: Diagnosis not present

## 2024-03-06 DIAGNOSIS — R079 Chest pain, unspecified: Secondary | ICD-10-CM | POA: Diagnosis not present

## 2024-03-06 DIAGNOSIS — I361 Nonrheumatic tricuspid (valve) insufficiency: Secondary | ICD-10-CM | POA: Diagnosis not present

## 2024-03-06 DIAGNOSIS — I484 Atypical atrial flutter: Secondary | ICD-10-CM | POA: Diagnosis not present

## 2024-03-06 DIAGNOSIS — I517 Cardiomegaly: Secondary | ICD-10-CM | POA: Diagnosis not present

## 2024-03-06 DIAGNOSIS — I482 Chronic atrial fibrillation, unspecified: Secondary | ICD-10-CM | POA: Diagnosis not present

## 2024-03-06 DIAGNOSIS — I509 Heart failure, unspecified: Secondary | ICD-10-CM | POA: Diagnosis not present

## 2024-03-06 DIAGNOSIS — M24461 Recurrent dislocation, right knee: Secondary | ICD-10-CM | POA: Diagnosis not present

## 2024-03-06 DIAGNOSIS — R600 Localized edema: Secondary | ICD-10-CM | POA: Diagnosis not present

## 2024-03-06 DIAGNOSIS — M25061 Hemarthrosis, right knee: Secondary | ICD-10-CM | POA: Diagnosis not present

## 2024-03-06 DIAGNOSIS — E871 Hypo-osmolality and hyponatremia: Secondary | ICD-10-CM | POA: Diagnosis not present

## 2024-03-06 DIAGNOSIS — M25561 Pain in right knee: Secondary | ICD-10-CM | POA: Diagnosis not present

## 2024-03-06 DIAGNOSIS — I340 Nonrheumatic mitral (valve) insufficiency: Secondary | ICD-10-CM | POA: Diagnosis not present

## 2024-03-06 DIAGNOSIS — I480 Paroxysmal atrial fibrillation: Secondary | ICD-10-CM | POA: Diagnosis not present

## 2024-03-06 DIAGNOSIS — D509 Iron deficiency anemia, unspecified: Secondary | ICD-10-CM | POA: Diagnosis not present

## 2024-03-06 DIAGNOSIS — I5023 Acute on chronic systolic (congestive) heart failure: Secondary | ICD-10-CM | POA: Diagnosis not present

## 2024-03-06 DIAGNOSIS — J90 Pleural effusion, not elsewhere classified: Secondary | ICD-10-CM | POA: Diagnosis not present

## 2024-03-06 DIAGNOSIS — M25461 Effusion, right knee: Secondary | ICD-10-CM | POA: Diagnosis not present

## 2024-03-07 DIAGNOSIS — I517 Cardiomegaly: Secondary | ICD-10-CM | POA: Diagnosis not present

## 2024-03-07 DIAGNOSIS — M24461 Recurrent dislocation, right knee: Secondary | ICD-10-CM | POA: Diagnosis not present

## 2024-03-07 DIAGNOSIS — I509 Heart failure, unspecified: Secondary | ICD-10-CM | POA: Diagnosis not present

## 2024-03-07 DIAGNOSIS — I340 Nonrheumatic mitral (valve) insufficiency: Secondary | ICD-10-CM | POA: Diagnosis not present

## 2024-03-07 DIAGNOSIS — I361 Nonrheumatic tricuspid (valve) insufficiency: Secondary | ICD-10-CM | POA: Diagnosis not present

## 2024-03-07 DIAGNOSIS — I480 Paroxysmal atrial fibrillation: Secondary | ICD-10-CM | POA: Diagnosis not present

## 2024-03-07 DIAGNOSIS — I484 Atypical atrial flutter: Secondary | ICD-10-CM | POA: Diagnosis not present

## 2024-03-07 DIAGNOSIS — M25561 Pain in right knee: Secondary | ICD-10-CM | POA: Diagnosis not present

## 2024-03-07 DIAGNOSIS — I441 Atrioventricular block, second degree: Secondary | ICD-10-CM | POA: Diagnosis not present

## 2024-03-07 DIAGNOSIS — D72821 Monocytosis (symptomatic): Secondary | ICD-10-CM | POA: Diagnosis not present

## 2024-03-08 DIAGNOSIS — I484 Atypical atrial flutter: Secondary | ICD-10-CM | POA: Diagnosis not present

## 2024-03-08 DIAGNOSIS — I509 Heart failure, unspecified: Secondary | ICD-10-CM | POA: Diagnosis not present

## 2024-03-08 DIAGNOSIS — D72821 Monocytosis (symptomatic): Secondary | ICD-10-CM | POA: Diagnosis not present

## 2024-03-09 DIAGNOSIS — I340 Nonrheumatic mitral (valve) insufficiency: Secondary | ICD-10-CM | POA: Diagnosis not present

## 2024-03-09 DIAGNOSIS — I480 Paroxysmal atrial fibrillation: Secondary | ICD-10-CM | POA: Diagnosis not present

## 2024-03-09 DIAGNOSIS — I484 Atypical atrial flutter: Secondary | ICD-10-CM | POA: Diagnosis not present

## 2024-03-09 DIAGNOSIS — I509 Heart failure, unspecified: Secondary | ICD-10-CM | POA: Diagnosis not present

## 2024-03-09 DIAGNOSIS — I361 Nonrheumatic tricuspid (valve) insufficiency: Secondary | ICD-10-CM | POA: Diagnosis not present

## 2024-03-10 ENCOUNTER — Encounter

## 2024-03-10 DIAGNOSIS — I480 Paroxysmal atrial fibrillation: Secondary | ICD-10-CM | POA: Diagnosis not present

## 2024-03-10 DIAGNOSIS — I4892 Unspecified atrial flutter: Secondary | ICD-10-CM | POA: Diagnosis not present

## 2024-03-10 DIAGNOSIS — I509 Heart failure, unspecified: Secondary | ICD-10-CM | POA: Diagnosis not present

## 2024-03-12 ENCOUNTER — Encounter: Admitting: Cardiology

## 2024-03-18 DIAGNOSIS — Z6820 Body mass index (BMI) 20.0-20.9, adult: Secondary | ICD-10-CM | POA: Diagnosis not present

## 2024-03-18 DIAGNOSIS — D509 Iron deficiency anemia, unspecified: Secondary | ICD-10-CM | POA: Diagnosis not present

## 2024-03-18 DIAGNOSIS — I429 Cardiomyopathy, unspecified: Secondary | ICD-10-CM | POA: Diagnosis not present

## 2024-03-18 DIAGNOSIS — I4891 Unspecified atrial fibrillation: Secondary | ICD-10-CM | POA: Diagnosis not present

## 2024-03-22 NOTE — Progress Notes (Unsigned)
  Electrophysiology Office Note:   Date:  03/23/2024  ID:  Cassandra Romero, DOB [birth year], MRN [id number]  Primary Cardiologist: Lamar Fitch, MD Primary Heart Failure: None Electrophysiologist: Hazim Treadway Gladis Norton, MD      History of Present Illness:   Cassandra Romero is a 89 y.o. female with h/o sick sinus syndrome, chronic systolic heart failure, atrial fibrillation/flutter, hypertension, GERD, CKD seen today for routine electrophysiology followup.   Since last being seen in our clinic the patient reports doing quite well.  Since her generator change, she did go into atrial fibrillation.  She was in A-fib for a few weeks, and had a cardioversion which resulted in improved energy and less shortness of breath.  She feels quite a bit better in normal rhythm.  For now she is happy with her management.  she denies chest pain, palpitations, dyspnea, PND, orthopnea, nausea, vomiting, dizziness, syncope, edema, weight gain, or early satiety.   Review of systems complete and found to be negative unless listed in HPI.      EP Information / Studies Reviewed:    EKG is ordered today. Personal review as below.      PPM Interrogation-  reviewed in detail today,  See PACEART report.  Device History: Medtronic Dual Chamber PPM implanted 12/30/2008, generator change 12/09/2023 for Sinus Node Dysfunction  Risk Assessment/Calculations:    CHA2DS2-VASc Score = 6   This indicates a 9.7% annual risk of stroke. The patient's score is based upon: CHF History: 1 HTN History: 1 Diabetes History: 1 Stroke History: 0 Vascular Disease History: 0 Age Score: 2 Gender Score: 1           Physical Exam:   VS:  BP 100/72   Pulse 88   Ht 5' 4 (1.626 m)   Wt 126 lb 6.4 oz (57.3 kg)   SpO2 93%   BMI 21.70 kg/m    Wt Readings from Last 3 Encounters:  03/23/24 126 lb 6.4 oz (57.3 kg)  01/02/24 130 lb 6.4 oz (59.1 kg)  12/09/23 125 lb (56.7 kg)     GEN: Well nourished, well developed in no acute  distress NECK: No JVD; No carotid bruits CARDIAC: Regular rate and rhythm, no murmurs, rubs, gallops RESPIRATORY:  Clear to auscultation without rales, wheezing or rhonchi  ABDOMEN: Soft, non-tender, non-distended EXTREMITIES:  No edema; No deformity   ASSESSMENT AND PLAN:    SND s/p Medtronic PPM  Normal PPM function See Pace Art report No changes today  2.  Persistent atrial fibrillation/flutter: Has a 33% atrial fibrillation burden on her device interrogation.  It was all related to 1 prolonged episode.  She is now post cardioversion.  She feels quite well.  We discussed antiarrhythmics including dofetilide and amiodarone as well as increasing her diltiazem  for improved rate control.  For now, she would like to continue with current management.  3.  Secondary to coagula state: On Eliquis   4.  Chronic systolic heart failure: Euvolemic on exam.  Plan per primary cardiology.  5.  Hypertension: Well-controlled  Disposition:   Follow up with Dr. Norton in 12 months  Signed, Kellyanne Ellwanger Gladis Norton, MD

## 2024-03-23 ENCOUNTER — Ambulatory Visit: Attending: Cardiology | Admitting: Cardiology

## 2024-03-23 ENCOUNTER — Encounter: Payer: Self-pay | Admitting: Cardiology

## 2024-03-23 VITALS — BP 100/72 | HR 88 | Ht 64.0 in | Wt 126.4 lb

## 2024-03-23 DIAGNOSIS — I495 Sick sinus syndrome: Secondary | ICD-10-CM | POA: Diagnosis not present

## 2024-03-23 DIAGNOSIS — D6869 Other thrombophilia: Secondary | ICD-10-CM

## 2024-03-23 DIAGNOSIS — I10 Essential (primary) hypertension: Secondary | ICD-10-CM

## 2024-03-23 DIAGNOSIS — I4819 Other persistent atrial fibrillation: Secondary | ICD-10-CM | POA: Diagnosis not present

## 2024-03-23 LAB — CUP PACEART INCLINIC DEVICE CHECK
Battery Remaining Longevity: 168 mo
Battery Voltage: 3.21 V
Brady Statistic AP VP Percent: 0.05 %
Brady Statistic AP VS Percent: 77.58 %
Brady Statistic AS VP Percent: 0 %
Brady Statistic AS VS Percent: 22.5 %
Brady Statistic RA Percent Paced: 51.95 %
Brady Statistic RV Percent Paced: 0.12 %
Date Time Interrogation Session: 20251020144917
Implantable Lead Connection Status: 753985
Implantable Lead Connection Status: 753985
Implantable Lead Implant Date: 20100729
Implantable Lead Implant Date: 20100729
Implantable Lead Location: 753859
Implantable Lead Location: 753860
Implantable Lead Model: 5076
Implantable Lead Model: 5076
Implantable Pulse Generator Implant Date: 20250707
Lead Channel Impedance Value: 304 Ohm
Lead Channel Impedance Value: 342 Ohm
Lead Channel Impedance Value: 437 Ohm
Lead Channel Impedance Value: 513 Ohm
Lead Channel Pacing Threshold Amplitude: 0.875 V
Lead Channel Pacing Threshold Amplitude: 1 V
Lead Channel Pacing Threshold Pulse Width: 0.4 ms
Lead Channel Pacing Threshold Pulse Width: 0.4 ms
Lead Channel Sensing Intrinsic Amplitude: 1.25 mV
Lead Channel Sensing Intrinsic Amplitude: 2.5 mV
Lead Channel Sensing Intrinsic Amplitude: 5.75 mV
Lead Channel Sensing Intrinsic Amplitude: 6.875 mV
Lead Channel Setting Pacing Amplitude: 1.75 V
Lead Channel Setting Pacing Amplitude: 2.25 V
Lead Channel Setting Pacing Pulse Width: 0.4 ms
Lead Channel Setting Sensing Sensitivity: 0.9 mV
Zone Setting Status: 755011
Zone Setting Status: 755011

## 2024-03-27 ENCOUNTER — Encounter: Payer: Self-pay | Admitting: Cardiology

## 2024-03-27 ENCOUNTER — Ambulatory Visit: Attending: Cardiology | Admitting: Cardiology

## 2024-03-27 VITALS — BP 102/64 | HR 102 | Ht 64.0 in | Wt 125.0 lb

## 2024-03-27 DIAGNOSIS — I4819 Other persistent atrial fibrillation: Secondary | ICD-10-CM

## 2024-03-27 DIAGNOSIS — D151 Benign neoplasm of heart: Secondary | ICD-10-CM

## 2024-03-27 DIAGNOSIS — I420 Dilated cardiomyopathy: Secondary | ICD-10-CM | POA: Diagnosis not present

## 2024-03-27 MED ORDER — AMIODARONE HCL 200 MG PO TABS: 200.0000 mg | ORAL_TABLET | Freq: Every day | ORAL | 3 refills | Status: AC

## 2024-03-27 NOTE — Patient Instructions (Addendum)
 Medication Instructions:   START: Amiodarone 200mg  1 tablet daily   Lab Work: None Ordered If you have labs (blood work) drawn today and your tests are completely normal, you will receive your results only by: MyChart Message (if you have MyChart) OR A paper copy in the mail If you have any lab test that is abnormal or we need to change your treatment, we will call you to review the results.   Testing/Procedures: None Ordered   Follow-Up: At [facility], you and your health needs are our priority.  As part of our continuing mission to provide you with exceptional heart care, we have created designated Provider Care Teams.  These Care Teams include your primary Cardiologist (physician) and Advanced Practice Providers (APPs -  Physician Assistants and Nurse Practitioners) who all work together to provide you with the care you need, when you need it.  We recommend signing up for the patient portal called MyChart.  Sign up information is provided on this After Visit Summary.  MyChart is used to connect with patients for Virtual Visits (Telemedicine).  Patients are able to view lab/test results, encounter notes, upcoming appointments, etc.  Non-urgent messages can be sent to your provider as well.   To learn more about what you can do with MyChart, go to [email or web address].    Your next appointment:   4 month(s)  The format for your next appointment:   In Person  Provider:   Lamar Fitch, MD    Other Instructions NA

## 2024-03-27 NOTE — Progress Notes (Signed)
 Cardiology Office Note:    Date:  03/27/2024   ID:  Cassandra Romero, DOB 1932-09-30, MRN [id number]  PCP:  Fernand Tracey LABOR, MD  Cardiologist:  Lamar Fitch, MD    Referring MD: Fernand Tracey LABOR, MD   Chief Complaint  Patient presents with   Hospitalization Follow-up    History of Present Illness:    Cassandra Romero is a 89 y.o. female past medical history significant for mixed mitral status post surgery many years ago, dual-chamber pacemaker implanted initially in 2000 recently replacement, essential hypertension, dyslipidemia, paroxysmal atrial fibrillation recently in the hospital with atrial flutter/fibrillation with fast ventricular rate, TEE has been performed ejection fraction 40 to 45% biatrial enlargement MR TR cardioversion has been performed since that time she is doing well comes today, if she is feeling fantastic, a lot of energy no problems denies have any chest pain tightness squeezing pressure burning chest palpitations  Past Medical History:  Diagnosis Date   Acute decompensated heart failure (HCC) 06/27/2021   Acute pulmonary edema (HCC)    Acute systolic CHF (congestive heart failure) (HCC) 07/09/2021   AKI (acute kidney injury)    Atrial fibrillation (HCC) 01/27/2009   Centricity Description: BRADYCARDIA Qualifier: Diagnosis of  By: Rolan Hough   Centricity Description: ATRIAL ARRHYTHMIAS Qualifier: Diagnosis of  By: Rolan Hough     Atrial flutter with rapid ventricular response (HCC)    Atrial myxoma    s/p resection   ATRIAL MYXOMA 01/27/2009   Qualifier: Diagnosis of  By: Rolan Hough     Cardiac pacemaker in situ    Chronic kidney disease    kidney tumor   Chronic kidney disease    kidney tumor   Congestive heart failure (HCC) 06/27/2021   Dilated cardiomyopathy (HCC) ejection fraction 25 to 30% in 2023 07/14/2021   Discharge from left nipple 09/19/2015   Disorder of bone and cartilage 01/27/2009   Qualifier: Diagnosis of   By: Rolan Hough       IMO SNOMED Dx Update Oct 2024     Essential hypertension 07/20/2010   Qualifier: Diagnosis of  By: Kelsie, MD, James     Fibrocystic breast changes of both breasts 09/19/2015   GERD 01/27/2009   Qualifier: Diagnosis of  By: Rolan Hough     Hypotension 07/12/2021   Inversion of nipple 12/11/2021   Neoplasm of uncertain behavior of skin 04/15/2018   Neurofibroma of back 03/10/2019   OSTEOPENIA 01/27/2009   Qualifier: Diagnosis of  By: Rolan Hough     Paroxysmal atrial fibrillation (HCC)    Pleural effusion on left 07/12/2021   Renal benign neoplasm, right 09/28/2020   Right renal mass    Seasonal allergies 09/10/2020   nonproductive cough   SICK SINUS/ TACHY-BRADY SYNDROME 01/27/2009   Qualifier: Diagnosis of  By: Rolan Hough     Unspecified open wound, right lower leg, sequela 04/16/2016   URI (upper respiratory infection) 09/07/2020   WITH COLD AND COUGH , TOOK 5 DAYS OF ANTIBIOTICS ALL ISSUES RESOLVED PER PT   [place] 06/13/2020   Wears glasses    Wears hearing aid in both ears     Past Surgical History:  Procedure Laterality Date   APPENDECTOMY  1954   OPEN   ATRIAL MYXOMA EXCISION  12/24/2008   Left -- Dr Dusty   BREAST SURGERY  1 ON RIGHT 2 ON LEFT   BIOPSY LAST DONE 1973, AND X 2 1960'S   CARDIOVERSION N/A 07/11/2021   Procedure: CARDIOVERSION;  Surgeon: Raford Riggs, MD;  Location: [facility] ENDOSCOPY;  Service: Cardiovascular;  Laterality: N/A;   CHOLECYSTECTOMY  2010   LAPAPROSCIPIC   CYSTOSCOPY W/ URETERAL STENT PLACEMENT Right 09/27/2020   Procedure: CYSTOSCOPY WITH RETROGRADE PYELOGRAM/URETERAL STENT PLACEMENT;  Surgeon: Devere Lonni Righter, MD;  Location: [facility];  Service: Urology;  Laterality: Right;   IR RADIOLOGIST EVAL & MGMT  08/03/2020   IR RADIOLOGIST EVAL & MGMT  03/07/2021   IR RADIOLOGIST EVAL & MGMT  10/09/2021   PACEMAKER INSERTION  02/02/2009   PARTIAL HYSTERECTOMY  1973   STILL HAS OVARIES   PPM GENERATOR  CHANGEOUT N/A 12/09/2023   Procedure: PPM GENERATOR CHANGEOUT;  Surgeon: Inocencio Soyla Lunger, MD;  Location: [facility] INVASIVE CV LAB;  Service: Cardiovascular;  Laterality: N/A;   RADIOLOGY WITH ANESTHESIA Right 09/28/2020   Procedure: IR WITH ANESTHESIA CT CRYOABLATION;  Surgeon: Karalee Wilkie POUR, MD;  Location: [facility] ORS;  Service: Anesthesiology;  Laterality: Right;   stapendectomy Bilateral 1970's   TEE WITHOUT CARDIOVERSION N/A 07/11/2021   Procedure: TRANSESOPHAGEAL ECHOCARDIOGRAM (TEE);  Surgeon: Raford Riggs, MD;  Location: [facility] ENDOSCOPY;  Service: Cardiovascular;  Laterality: N/A;    Current Medications: Current Meds  Medication Sig   acetaminophen  (TYLENOL ) 500 MG tablet Take 500 mg by mouth every 6 (six) hours as needed for mild pain.   citalopram (CELEXA) 10 MG tablet Take 10 mg by mouth daily as needed (stress).   dicyclomine (BENTYL) 10 MG capsule Take 10 mg by mouth 3 (three) times daily as needed for spasms.   docusate sodium  (COLACE) 100 MG capsule Take 100 mg by mouth 2 (two) times daily.   ELIQUIS  2.5 MG TABS tablet Take 1 tablet (2.5 mg total) by mouth 2 (two) times daily.   famotidine  (PEPCID ) 20 MG tablet Take 20 mg by mouth 2 (two) times daily as needed for heartburn or indigestion.   furosemide  (LASIX ) 20 MG tablet Take 1 tablet (20 mg total) by mouth daily.     Allergies:   Aspirin, Alendronate sodium, Amiodarone hcl, Bactrim [sulfamethoxazole-trimethoprim], Diltiazem , Entresto  [sacubitril-valsartan], Ibandronate sodium, Risedronate sodium, Statins, Vimovo [naproxen-esomeprazole mg], Xarelto  [rivaroxaban ], Metaxalone, Mobic [meloxicam], and Toprol  xl [metoprolol ]   Social History   Socioeconomic History   Marital status: Married    Spouse name: Not on file   Number of children: Not on file   Years of education: Not on file   Highest education level: Not on file  Occupational History   Occupation: retired Runner, broadcasting/film/video  Tobacco Use   Smoking status: Former     Current packs/day: 0.00    Average packs/day: 0.3 packs/day for 12.0 years (3.0 ttl pk-yrs)    Types: Cigarettes    Start date: 06/04/1976    Quit date: 06/04/1988    Years since quitting: 35.8   Smokeless tobacco: Never  Vaping Use   Vaping status: Never Used  Substance and Sexual Activity   Alcohol use: Not Currently   Drug use: No   Sexual activity: Not on file  Other Topics Concern   Not on file  Social History Narrative   Lives in [place]   Social Drivers of Health   Financial Resource Strain: Not on file  Food Insecurity: Low Risk  (11/18/2023)   Received from [facility]   Hunger Vital Sign    Within the past 12 months, you worried that your food would run out before you got money to buy more: Never true    Within the past 12 months, the food  you bought just didn't last and you didn't have money to get more. : Never true  Transportation Needs: No Transportation Needs (11/18/2023)   Received from [facility]    In the past 12 months, has lack of reliable transportation kept you from medical appointments, meetings, work or from getting things needed for daily living? : No  Physical Activity: Not on file  Stress: Not on file  Social Connections: Not on file     Family History: The patient's family history includes Coronary artery disease in an other family member; Dementia in her mother and another family member; Heart attack (age of onset: 28) in her father; Heart disease in her father; Pancreatic cancer in her mother and another family member; Thyroid disease in her mother; Transient ischemic attack in her mother. ROS:   Please see the history of present illness.    All 14 point review of systems negative except as described per history of present illness  EKGs/Labs/Other Studies Reviewed:         Recent Labs: 11/11/2023: BUN 14; Creatinine, Ser 0.84; Hemoglobin 12.4; Platelets 271; Potassium 3.9; Sodium 144  Recent Lipid Panel    Component Value  Date/Time   CHOL  12/22/2008 0150    146        ATP III CLASSIFICATION:  <200     mg/dL   Desirable  799-760  mg/dL   Borderline High  >=759    mg/dL   High          TRIG 83 12/22/2008 0150   HDL 36 (L) 12/22/2008 0150   CHOLHDL 4.1 12/22/2008 0150   VLDL 17 12/22/2008 0150   LDLCALC  12/22/2008 0150    93        Total Cholesterol/HDL:CHD Risk Coronary Heart Disease Risk Table                     Men   Women  1/2 Average Risk   3.4   3.3  Average Risk       5.0   4.4  2 X Average Risk   9.6   7.1  3 X Average Risk  23.4   11.0        Use the calculated Patient Ratio above and the CHD Risk Table to determine the patient's CHD Risk.        ATP III CLASSIFICATION (LDL):  <100     mg/dL   Optimal  899-870  mg/dL   Near or Above                    Optimal  130-159  mg/dL   Borderline  839-810  mg/dL   High  >809     mg/dL   Very High    Physical Exam:    VS:  BP 102/64   Pulse (!) 102   Ht 5' 4 (1.626 m)   Wt 125 lb (56.7 kg)   SpO2 94%   BMI 21.46 kg/m     Wt Readings from Last 3 Encounters:  03/27/24 125 lb (56.7 kg)  03/23/24 126 lb 6.4 oz (57.3 kg)  01/02/24 130 lb 6.4 oz (59.1 kg)     GEN:  Well nourished, well developed in no acute distress HEENT: Normal NECK: No JVD; No carotid bruits LYMPHATICS: No lymphadenopathy CARDIAC: RRR, no murmurs, no rubs, no gallops RESPIRATORY:  Clear to auscultation without rales, wheezing or rhonchi  ABDOMEN: Soft, non-tender, non-distended MUSCULOSKELETAL:  No edema; No deformity  SKIN: Warm and dry LOWER EXTREMITIES: no swelling NEUROLOGIC:  Alert and oriented x 3 PSYCHIATRIC:  Normal affect   ASSESSMENT:    1. Persistent atrial fibrillation (HCC)   2. Atrial myxoma s/p resection done more than 20 years ago   3. Dilated cardiomyopathy (HCC) ejection fraction 25 to 30% in 2023    PLAN:    In order of problems listed above:  Paroxysmal atrial fibrillation, status post cardioversion, continue anticoagulation,  she is supposed to be on amiodarone but for some reason it was not given to her, we will restart medication. History of atrial myxoma surgery doing well. Cardiomyopathy ejection fraction 4045% while in tachyarrhythmia, I have no room in terms of blood pressure to put him on any medication for it we will continue present management will recheck her echocardiogram few months later. Overall she looks very good.  She is taking care of sick husband who is in the nursing home.   Medication Adjustments/Labs and Tests Ordered: Current medicines are reviewed at length with the patient today.  Concerns regarding medicines are outlined above.  Orders Placed This Encounter  Procedures   EKG 12-Lead   Medication changes: No orders of the defined types were placed in this encounter.   Signed, Lamar DOROTHA Fitch, MD, [facility] 03/27/2024 2:42 PM    [facility]

## 2024-03-28 LAB — BASIC METABOLIC PANEL WITH GFR
BUN/Creatinine Ratio: 15 (ref 12–28)
BUN: 15 mg/dL (ref 8–27)
CO2: 27 mmol/L (ref 20–29)
Calcium: 9.7 mg/dL (ref 8.7–10.3)
Chloride: 98 mmol/L (ref 96–106)
Creatinine, Ser: 0.99 mg/dL (ref 0.57–1.00)
Glucose: 98 mg/dL (ref 70–99)
Potassium: 3.6 mmol/L (ref 3.5–5.2)
Sodium: 138 mmol/L (ref 134–144)
eGFR: 55 mL/min/1.73 — ABNORMAL LOW (ref >59)

## 2024-03-28 LAB — CBC
Hematocrit: 41.1 % (ref 34.0–46.6)
Hemoglobin: 12.3 g/dL (ref 11.1–15.9)
MCH: 27.2 pg (ref 26.6–33.0)
MCHC: 29.9 g/dL — ABNORMAL LOW (ref 31.5–35.7)
MCV: 91 fL (ref 79–97)
Platelets: 256 x10E3/uL (ref 150–450)
RBC: 4.53 x10E6/uL (ref 3.77–5.28)
RDW: 15.7 % — ABNORMAL HIGH (ref 11.7–15.4)
WBC: 5.9 x10E3/uL (ref 3.4–10.8)

## 2024-03-30 ENCOUNTER — Ambulatory Visit: Payer: Self-pay | Admitting: Cardiology

## 2024-03-30 ENCOUNTER — Encounter: Payer: Self-pay | Admitting: Cardiology

## 2024-03-30 ENCOUNTER — Telehealth: Payer: Self-pay | Admitting: Cardiology

## 2024-03-30 NOTE — Telephone Encounter (Signed)
 Pt states that she cannot take Amiodarone due to nausea/vomiting.

## 2024-03-30 NOTE — Telephone Encounter (Signed)
 Patient came by the office stating that she was prescribed amiodarone by Dr. MARLA but wanted to [facility] Dr. MARLA aware that she is allergic to that medication. CB # [phone]

## 2024-04-01 ENCOUNTER — Ambulatory Visit: Payer: Self-pay | Admitting: Cardiology

## 2024-04-06 DIAGNOSIS — D509 Iron deficiency anemia, unspecified: Secondary | ICD-10-CM | POA: Diagnosis not present

## 2024-04-06 DIAGNOSIS — F331 Major depressive disorder, recurrent, moderate: Secondary | ICD-10-CM | POA: Diagnosis not present

## 2024-04-06 DIAGNOSIS — R110 Nausea: Secondary | ICD-10-CM | POA: Diagnosis not present

## 2024-04-06 DIAGNOSIS — Z6820 Body mass index (BMI) 20.0-20.9, adult: Secondary | ICD-10-CM | POA: Diagnosis not present

## 2024-04-06 DIAGNOSIS — I429 Cardiomyopathy, unspecified: Secondary | ICD-10-CM | POA: Diagnosis not present

## 2024-04-06 DIAGNOSIS — K219 Gastro-esophageal reflux disease without esophagitis: Secondary | ICD-10-CM | POA: Diagnosis not present

## 2024-04-06 DIAGNOSIS — K5900 Constipation, unspecified: Secondary | ICD-10-CM | POA: Diagnosis not present

## 2024-04-08 ENCOUNTER — Telehealth: Payer: Self-pay

## 2024-04-08 NOTE — Telephone Encounter (Signed)
 LVM per DPR- per Dr. Vanetta Shawl note regarding normal Echo results. Encouraged to call with any questions. Routed to PCP.

## 2024-04-09 NOTE — Telephone Encounter (Signed)
 Spoke with pt and advised to hold Amiodarone due to allergy. Pt would like to know is there any other medication she needs.  Pt states that she was started on Farxiga 10 mg daily when in the hospital.

## 2024-04-21 ENCOUNTER — Ambulatory Visit: Payer: Medicare PPO

## 2024-05-11 ENCOUNTER — Ambulatory Visit

## 2024-05-12 LAB — CUP PACEART REMOTE DEVICE CHECK
Battery Remaining Longevity: 167 mo
Battery Voltage: 3.2 V
Brady Statistic AP VP Percent: 0.05 %
Brady Statistic AP VS Percent: 55.73 %
Brady Statistic AS VP Percent: 0 %
Brady Statistic AS VS Percent: 44.22 %
Brady Statistic RA Percent Paced: 55.76 %
Brady Statistic RV Percent Paced: 0.06 %
Date Time Interrogation Session: 20251208022105
Implantable Lead Connection Status: 753985
Implantable Lead Connection Status: 753985
Implantable Lead Implant Date: 20100729
Implantable Lead Implant Date: 20100729
Implantable Lead Location: 753859
Implantable Lead Location: 753860
Implantable Lead Model: 5076
Implantable Lead Model: 5076
Implantable Pulse Generator Implant Date: 20250707
Lead Channel Impedance Value: 285 Ohm
Lead Channel Impedance Value: 323 Ohm
Lead Channel Impedance Value: 437 Ohm
Lead Channel Impedance Value: 437 Ohm
Lead Channel Pacing Threshold Amplitude: 0.75 V
Lead Channel Pacing Threshold Amplitude: 1.125 V
Lead Channel Pacing Threshold Pulse Width: 0.4 ms
Lead Channel Pacing Threshold Pulse Width: 0.4 ms
Lead Channel Sensing Intrinsic Amplitude: 1.25 mV
Lead Channel Sensing Intrinsic Amplitude: 1.25 mV
Lead Channel Sensing Intrinsic Amplitude: 5.125 mV
Lead Channel Sensing Intrinsic Amplitude: 5.125 mV
Lead Channel Setting Pacing Amplitude: 1.5 V
Lead Channel Setting Pacing Amplitude: 2.25 V
Lead Channel Setting Pacing Pulse Width: 0.4 ms
Lead Channel Setting Sensing Sensitivity: 0.9 mV
Zone Setting Status: 755011
Zone Setting Status: 755011

## 2024-05-13 NOTE — Progress Notes (Signed)
 Remote PPM Transmission

## 2024-05-29 ENCOUNTER — Ambulatory Visit: Admitting: Cardiology

## 2024-06-02 NOTE — Progress Notes (Signed)
 CC:  Fibrocystic breast disease  Cassandra Romero 89 y.o. female who has fibrocystic breast disease.  She had a right breast central duct excision for chronic nipple discharge.  She now has chronic right nipple inversion.  She denies breast mass, pain, or nipple discharge.  Recent diagnostic breast imaging is reviewed independently and is BIRADS-2.  She is in her usual state of health.  She states her husband passed away earlier this month.  Medical History[1]   Surgical History[2]   Social History   Socioeconomic History   Marital status: Married    Spouse name: Not on file   Number of children: Not on file   Years of education: Not on file   Highest education level: Not on file  Occupational History   Not on file  Tobacco Use   Smoking status: Former   Smokeless tobacco: Never  Substance and Sexual Activity   Alcohol use: Yes   Drug use: No   Sexual activity: Not on file  Other Topics Concern   Not on file  Social History Narrative   Not on file   Social Drivers of Health   Living Situation: Low Risk (06/02/2024)   Living Situation    What is your living situation today?: I have a steady place to live    Think about the place you live. Do you have problems with any of the following? Choose all that apply:: None/None on this list  Food Insecurity: Low Risk (06/02/2024)   Food vital sign    Within the past 12 months, you worried that your food would run out before you got money to buy more: Never true    Within the past 12 months, the food you bought just didn't last and you didn't have money to get more: Never true  Transportation Needs: No Transportation Needs (06/02/2024)   Transportation    In the past 12 months, has lack of reliable transportation kept you from medical appointments, meetings, work or from getting things needed for daily living? : No  Utilities: Low Risk (06/02/2024)   Utilities    In the past 12 months has the electric, gas,  oil, or water company threatened to shut off services in your home? : No  Safety: Low Risk (06/02/2024)   Safety    How often does anyone, including family and friends, physically hurt you?: Never    How often does anyone, including family and friends, insult or talk down to you?: Never    How often does anyone, including family and friends, threaten you with harm?: Never    How often does anyone, including family and friends, scream or curse at you?: Never  Alcohol Screening: Not on file  Tobacco Use: Medium Risk (06/02/2024)   Patient History    Smoking Tobacco Use: Former    Smokeless Tobacco Use: Never    Passive Exposure: Not on file  Depression: Not At Risk (11/18/2023)   PHQ-2    PHQ-2 Score: 2  Social Connections: Not on file  Financial Resource Strain: Not on file     Family History[3]   Medications ordered prior to the current encounter[4]    Allergies[5]        ROS:  Complete system ROS was verbally obtained and reviewed today and is negative with the exception of the pertinent positives and negatives listed in the HPI.   BP 124/68   Pulse 94   Temp 97.1 F (36.2 C) (Temporal)   Resp 14  Ht 1.651 m (5' 5)   Wt 56.7 kg (125 lb)   SpO2 92%   BMI 20.80 kg/m   General:  Normal female no distress HEENT:  Normocephalic, atraumatic Neck:  Full range of motion, no tenderness, no thyroid enlargement or nodularity, no cervical adenopathy Chest:  Equal expansion bilaterally Lungs:  Clear to auscultation without wheezes or rhonchi Right breast:  No mass, no tenderness, no nipple discharge, no axillary adenopathy, incisions well-healed, nipple inversion noted Left breast:  No mass, no tenderness, no nipple discharge, no axillary adenopathy Cardiac: regular rate and rhythm  Abdomen:  Soft, nontender, active bowel sounds, no ventral hernia noted Extremities: normal gait Skin: no jaundice Neurologic: no focal deficits noted   ASSESSMENT:  Fibrocystic  breast disease  PLAN:  Stable breast exam and benign breast imaging. No evidence of surgical breast disease.  Plan follow up visit here in one year with breast imaging. Plan surveillance breast exam at that time.   I recommend follow up with primary care for medical issues.  Treatment and follow up plan was discussed with the patient.   I have personally spent 21 minutes involved in face-to-face and non-face-to-face activities for this patient on the day of the visit.  Professional time spent includes the following activities, in addition to those noted in the documentation: Review of prior office notes, independent review of recent breast imaging.       [1] Past Medical History: Diagnosis Date   Anal bleeding    Blood in stool    Breast pain, right    CHF (congestive heart failure)    (CMD)    Discharge from left nipple    Fibrocystic breast disease    GERD (gastroesophageal reflux disease)    H/O oral surgery    Hearing loss of aging    Hemorrhoids with complication    Neoplasm of uncertain behavior of skin    Nipple discharge    Ovarian cyst    Pelvic mass in female   [2] Past Surgical History: Procedure Laterality Date   APPENDECTOMY     Procedure: APPENDECTOMY   BREAST SURGERY Right 2015   Procedure: BREAST SURGERY   CARDIAC PACEMAKER PLACEMENT     Procedure: CARDIAC PACEMAKER PLACEMENT   CHOLECYSTECTOMY     Procedure: CHOLECYSTECTOMY   HYSTERECTOMY      Procedure: HYSTERECTOMY   INNER EAR SURGERY     Procedure: INNER EAR SURGERY   MOUTH SURGERY      Procedure: MOUTH SURGERY   OTHER SURGICAL HISTORY     Procedure: OTHER SURGICAL HISTORY (Resection of Cardiac Tumor)   OTHER SURGICAL HISTORY Right    Procedure: OTHER SURGICAL HISTORY (cryoablation of renal turmor)  [3] Family History Problem Relation Name Age of Onset   Stroke Mother     Pancreatic cancer Mother     Heart disease Father     Cancer Maternal Grandfather     [4] Current Outpatient Medications on File Prior to Visit  Medication Sig   acetaminophen  (TYLENOL ) 500 mg tablet Take 500 mg by mouth.   citalopram (CeleXA) 10 mg tablet 10 mg.   dicyclomine (BENTYL) 10 mg capsule Take 10 mg by mouth 3 (three) times a day as needed.   docusate sodium  (COLACE) 100 mg capsule Take 100 mg by mouth 2 (two) times a day.   Eliquis  2.5 mg tab Take 2.5 mg by mouth 2 (two) times a day.   furosemide  (LASIX ) 20 mg tablet Take 20 mg by mouth Once  Daily.   multivitamin with minerals (Daily Multivitamin-Minerals) tab Take 1 tablet by mouth Once Daily.   potassium chloride  (KLOR-CON ) 10 mEq ER tablet Take 10 mEq by mouth Once Daily.   calcium carbonate (Calci-Mix) 500 mg calcium (1,250 mg) cap Take 1,250 mg by mouth Once Daily. (Patient not taking: Reported on 06/02/2024)   cholecalciferol (VITAMIN D3) 10 mcg (400 unit) tablet Take 1,000 mg by mouth Once Daily. (Patient not taking: Reported on 06/02/2024)   famotidine  (PEPCID ) 20 mg tablet Take 20 mg by mouth 2 (two) times a day. (Patient not taking: Reported on 06/02/2024)   gabapentin (NEURONTIN) 300 mg capsule Take 300 mg by mouth 2 (two) times a day. (Patient not taking: Reported on 06/02/2024)   losartan  (COZAAR ) 25 mg tablet Take 1 tablet (25 mg total) by mouth 2 (two) times daily. (Patient not taking: Reported on 06/02/2024)   metoprolol  succinate (TOPROL  XL) 25 mg 24 hr tablet Take 25 mg by mouth Once Daily. (Patient not taking: Reported on 06/02/2024)   No current facility-administered medications on file prior to visit.  [5] Allergies Allergen Reactions   Aspirin Other (See Comments)    Makes her feel sick, Unknown, Makes her feel sick   Alendronate Other (See Comments)    Unknown   Amiodarone  Other (See Comments)    Unknown   Ibandronate Other (See Comments)    Unknown   Metaxalone Other (See Comments)    REACTION: rash, Unknown   Risedronate Other (See Comments)    Unknown    Rivaroxaban  Other (See Comments)   Sacubitril-Valsartan Other (See Comments)    BP bottomed out   Statins-Hmg-Coa Reductase Inhibitors Other (See Comments)    Unknown   Sulfamethoxazole-Trimethoprim Other (See Comments)    Abdominal pain   Meloxicam Rash   Metoprolol  Rash

## 2024-06-05 ENCOUNTER — Other Ambulatory Visit: Payer: Self-pay | Admitting: Interventional Radiology

## 2024-06-05 DIAGNOSIS — N2889 Other specified disorders of kidney and ureter: Secondary | ICD-10-CM

## 2024-06-06 ENCOUNTER — Ambulatory Visit: Payer: Self-pay | Admitting: Cardiology

## 2024-06-08 ENCOUNTER — Other Ambulatory Visit: Payer: Self-pay

## 2024-06-08 MED ORDER — FUROSEMIDE 20 MG PO TABS: 20.0000 mg | ORAL_TABLET | Freq: Every day | ORAL | 2 refills | Status: AC

## 2024-06-09 ENCOUNTER — Encounter

## 2024-06-11 ENCOUNTER — Other Ambulatory Visit: Payer: Self-pay | Admitting: Interventional Radiology

## 2024-06-11 DIAGNOSIS — N2889 Other specified disorders of kidney and ureter: Secondary | ICD-10-CM

## 2024-06-12 ENCOUNTER — Other Ambulatory Visit: Payer: Self-pay | Admitting: *Deleted

## 2024-06-12 DIAGNOSIS — N2889 Other specified disorders of kidney and ureter: Secondary | ICD-10-CM

## 2024-07-07 ENCOUNTER — Encounter: Payer: Self-pay | Admitting: *Deleted

## 2024-07-09 ENCOUNTER — Ambulatory Visit: Admitting: Cardiology

## 2024-07-21 ENCOUNTER — Ambulatory Visit: Payer: Medicare PPO

## 2024-07-28 ENCOUNTER — Ambulatory Visit: Admitting: Cardiology

## 2024-08-10 ENCOUNTER — Encounter

## 2024-09-08 ENCOUNTER — Encounter

## 2024-10-20 ENCOUNTER — Ambulatory Visit: Payer: Medicare PPO

## 2024-11-09 ENCOUNTER — Encounter

## 2024-12-08 ENCOUNTER — Encounter

## 2025-02-09 ENCOUNTER — Encounter

## 2025-03-09 ENCOUNTER — Encounter

## 2025-05-11 ENCOUNTER — Encounter

## 2025-06-08 ENCOUNTER — Encounter

## 2025-08-10 ENCOUNTER — Encounter

## 2025-09-07 ENCOUNTER — Encounter

## 2025-11-09 ENCOUNTER — Encounter

## 2025-12-07 ENCOUNTER — Encounter
